# Patient Record
Sex: Female | Born: 1935 | Race: White | Hispanic: No | Smoking: Never smoker
Health system: Southern US, Community
[De-identification: ages and names within clinical notes are randomized; demographics above are authoritative.]

## PROBLEM LIST (undated history)

## (undated) DIAGNOSIS — K5900 Constipation, unspecified: Secondary | ICD-10-CM

## (undated) DIAGNOSIS — R030 Elevated blood-pressure reading, without diagnosis of hypertension: Secondary | ICD-10-CM

## (undated) DIAGNOSIS — I6789 Other cerebrovascular disease: Secondary | ICD-10-CM

## (undated) DIAGNOSIS — R209 Unspecified disturbances of skin sensation: Secondary | ICD-10-CM

## (undated) DIAGNOSIS — K269 Duodenal ulcer, unspecified as acute or chronic, without hemorrhage or perforation: Secondary | ICD-10-CM

## (undated) DIAGNOSIS — G119 Hereditary ataxia, unspecified: Secondary | ICD-10-CM

## (undated) DIAGNOSIS — R5381 Other malaise: Secondary | ICD-10-CM

## (undated) DIAGNOSIS — N182 Chronic kidney disease, stage 2 (mild): Secondary | ICD-10-CM

## (undated) DIAGNOSIS — R269 Unspecified abnormalities of gait and mobility: Secondary | ICD-10-CM

## (undated) DIAGNOSIS — M545 Low back pain, unspecified: Secondary | ICD-10-CM

## (undated) DIAGNOSIS — K649 Unspecified hemorrhoids: Secondary | ICD-10-CM

## (undated) DIAGNOSIS — N816 Rectocele: Secondary | ICD-10-CM

## (undated) DIAGNOSIS — R609 Edema, unspecified: Secondary | ICD-10-CM

## (undated) DIAGNOSIS — G2581 Restless legs syndrome: Secondary | ICD-10-CM

## (undated) DIAGNOSIS — B354 Tinea corporis: Secondary | ICD-10-CM

## (undated) DIAGNOSIS — I951 Orthostatic hypotension: Secondary | ICD-10-CM

## (undated) DIAGNOSIS — H3530 Unspecified macular degeneration: Secondary | ICD-10-CM

## (undated) DIAGNOSIS — N952 Postmenopausal atrophic vaginitis: Secondary | ICD-10-CM

## (undated) DIAGNOSIS — I4460 Unspecified fascicular block: Secondary | ICD-10-CM

## (undated) DIAGNOSIS — R42 Dizziness and giddiness: Secondary | ICD-10-CM

## (undated) DIAGNOSIS — IMO0001 Reserved for inherently not codable concepts without codable children: Secondary | ICD-10-CM

## (undated) DIAGNOSIS — G63 Polyneuropathy in diseases classified elsewhere: Principal | ICD-10-CM

## (undated) DIAGNOSIS — F419 Anxiety disorder, unspecified: Secondary | ICD-10-CM

## (undated) DIAGNOSIS — I10 Essential (primary) hypertension: Secondary | ICD-10-CM

## (undated) DIAGNOSIS — Z9181 History of falling: Secondary | ICD-10-CM

## (undated) DIAGNOSIS — R5383 Other fatigue: Secondary | ICD-10-CM

## (undated) DIAGNOSIS — M1990 Unspecified osteoarthritis, unspecified site: Secondary | ICD-10-CM

## (undated) DIAGNOSIS — N8111 Cystocele, midline: Secondary | ICD-10-CM

## (undated) DIAGNOSIS — K219 Gastro-esophageal reflux disease without esophagitis: Secondary | ICD-10-CM

## (undated) DIAGNOSIS — R7989 Other specified abnormal findings of blood chemistry: Secondary | ICD-10-CM

## (undated) DIAGNOSIS — G609 Hereditary and idiopathic neuropathy, unspecified: Secondary | ICD-10-CM

## (undated) DIAGNOSIS — R3915 Urgency of urination: Secondary | ICD-10-CM

## (undated) HISTORY — DX: Low back pain, unspecified: M54.50

## (undated) HISTORY — DX: Tinea corporis: B35.4

## (undated) HISTORY — DX: Dizziness and giddiness: R42

## (undated) HISTORY — DX: Unspecified hemorrhoids: K64.9

## (undated) HISTORY — DX: Restless legs syndrome: G25.81

## (undated) HISTORY — DX: Urgency of urination: R39.15

## (undated) HISTORY — DX: Chronic kidney disease, stage 2 (mild): N18.2

## (undated) HISTORY — DX: Cystocele, midline: N81.11

## (undated) HISTORY — DX: Unspecified abnormalities of gait and mobility: R26.9

## (undated) HISTORY — DX: Edema, unspecified: R60.9

## (undated) HISTORY — DX: Unspecified disturbances of skin sensation: R20.9

## (undated) HISTORY — DX: Unspecified fascicular block: I44.60

## (undated) HISTORY — DX: History of falling: Z91.81

## (undated) HISTORY — DX: Duodenal ulcer, unspecified as acute or chronic, without hemorrhage or perforation: K26.9

## (undated) HISTORY — DX: Reserved for inherently not codable concepts without codable children: IMO0001

## (undated) HISTORY — DX: Hereditary and idiopathic neuropathy, unspecified: G60.9

## (undated) HISTORY — DX: Rectocele: N81.6

## (undated) HISTORY — DX: Polyneuropathy in diseases classified elsewhere: G63

## (undated) HISTORY — DX: Unspecified osteoarthritis, unspecified site: M19.90

## (undated) HISTORY — DX: Constipation, unspecified: K59.00

## (undated) HISTORY — DX: Postmenopausal atrophic vaginitis: N95.2

## (undated) HISTORY — DX: Gastro-esophageal reflux disease without esophagitis: K21.9

## (undated) HISTORY — DX: Elevated blood-pressure reading, without diagnosis of hypertension: R03.0

## (undated) HISTORY — DX: Other fatigue: R53.83

## (undated) HISTORY — PX: CATARACT EXTRACTION: SUR2

## (undated) HISTORY — DX: Anxiety disorder, unspecified: F41.9

## (undated) HISTORY — DX: Other cerebrovascular disease: I67.89

## (undated) HISTORY — DX: Low back pain: M54.5

## (undated) HISTORY — DX: Hereditary ataxia, unspecified: G11.9

## (undated) HISTORY — DX: Orthostatic hypotension: I95.1

## (undated) HISTORY — DX: Other specified abnormal findings of blood chemistry: R79.89

## (undated) HISTORY — DX: Other malaise: R53.81

## (undated) HISTORY — DX: Essential (primary) hypertension: I10

## (undated) HISTORY — DX: Unspecified macular degeneration: H35.30

---
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
## 1970-10-08 HISTORY — PX: ABDOMINAL HYSTERECTOMY: SHX81

## 1998-03-22 ENCOUNTER — Ambulatory Visit (HOSPITAL_COMMUNITY): Admission: RE | Admit: 1998-03-22 | Discharge: 1998-03-22 | Payer: Self-pay | Admitting: Obstetrics & Gynecology

## 2008-10-08 HISTORY — PX: EXTERNAL EAR SURGERY: SHX627

## 2010-04-17 ENCOUNTER — Encounter: Admission: RE | Admit: 2010-04-17 | Discharge: 2010-04-17 | Payer: Self-pay | Admitting: Internal Medicine

## 2010-07-19 ENCOUNTER — Inpatient Hospital Stay (HOSPITAL_COMMUNITY): Admission: EM | Admit: 2010-07-19 | Discharge: 2010-07-25 | Payer: Self-pay | Admitting: Emergency Medicine

## 2010-12-20 LAB — BASIC METABOLIC PANEL
BUN: 14 mg/dL (ref 6–23)
BUN: 15 mg/dL (ref 6–23)
BUN: 16 mg/dL (ref 6–23)
CO2: 21 mEq/L (ref 19–32)
CO2: 23 mEq/L (ref 19–32)
CO2: 24 mEq/L (ref 19–32)
Calcium: 8 mg/dL — ABNORMAL LOW (ref 8.4–10.5)
Calcium: 8.7 mg/dL (ref 8.4–10.5)
Calcium: 9 mg/dL (ref 8.4–10.5)
Chloride: 105 mEq/L (ref 96–112)
Chloride: 105 mEq/L (ref 96–112)
Creatinine, Ser: 1.01 mg/dL (ref 0.4–1.2)
Creatinine, Ser: 1.11 mg/dL (ref 0.4–1.2)
Creatinine, Ser: 1.19 mg/dL (ref 0.4–1.2)
GFR calc Af Amer: 53 mL/min — ABNORMAL LOW (ref >60)
GFR calc Af Amer: 57 mL/min — ABNORMAL LOW (ref >60)
GFR calc Af Amer: >60 mL/min (ref >60)
GFR calc non Af Amer: 44 mL/min — ABNORMAL LOW (ref >60)
GFR calc non Af Amer: 47 mL/min — ABNORMAL LOW (ref >60)
Glucose, Bld: 105 mg/dL — ABNORMAL HIGH (ref 70–99)
Glucose, Bld: 107 mg/dL — ABNORMAL HIGH (ref 70–99)
Glucose, Bld: 155 mg/dL — ABNORMAL HIGH (ref 70–99)
Potassium: 3.7 mEq/L (ref 3.5–5.1)
Sodium: 135 mEq/L (ref 135–145)

## 2010-12-20 LAB — CBC
Hemoglobin: 12.2 g/dL (ref 12.0–15.0)
MCH: 30.9 pg (ref 26.0–34.0)
MCHC: 35.5 g/dL (ref 30.0–36.0)
MCV: 87.1 fL (ref 78.0–100.0)
Platelets: 174 10*3/uL (ref 150–400)

## 2010-12-21 LAB — CATECHOLAMINES, FRACTIONATED, URINE, 24 HOUR
Creatinine, Urine mg/day-CATEUR: 0.95 g/(24.h) (ref 0.63–2.50)
Dopamine 24 Hr Urine: 79 mcg/24 h (ref 52–480)
Epinephrine 24 Hr Urine: 6 mcg/24 h (ref 2–24)

## 2010-12-21 LAB — BASIC METABOLIC PANEL
BUN: 12 mg/dL (ref 6–23)
BUN: 13 mg/dL (ref 6–23)
CO2: 26 mEq/L (ref 19–32)
Calcium: 8.8 mg/dL (ref 8.4–10.5)
Calcium: 8.8 mg/dL (ref 8.4–10.5)
Calcium: 8.8 mg/dL (ref 8.4–10.5)
Creatinine, Ser: 0.99 mg/dL (ref 0.4–1.2)
GFR calc Af Amer: >60 mL/min (ref >60)
Glucose, Bld: 92 mg/dL (ref 70–99)
Glucose, Bld: 99 mg/dL (ref 70–99)
Potassium: 3.7 mEq/L (ref 3.5–5.1)

## 2010-12-21 LAB — POCT I-STAT, CHEM 8
BUN: 15 mg/dL (ref 6–23)
Chloride: 90 mEq/L — ABNORMAL LOW (ref 96–112)
Creatinine, Ser: 1.1 mg/dL (ref 0.4–1.2)
HCT: 43 % (ref 36.0–46.0)
Hemoglobin: 14.6 g/dL (ref 12.0–15.0)
TCO2: 27 mmol/L (ref 0–100)

## 2010-12-21 LAB — URINALYSIS, ROUTINE W REFLEX MICROSCOPIC
Glucose, UA: NEGATIVE mg/dL
Ketones, ur: 15 mg/dL — AB
Nitrite: NEGATIVE
Protein, ur: NEGATIVE mg/dL

## 2010-12-21 LAB — CBC
HCT: 35.7 % — ABNORMAL LOW (ref 36.0–46.0)
HCT: 39.6 % (ref 36.0–46.0)
MCH: 30.1 pg (ref 26.0–34.0)
MCH: 30.5 pg (ref 26.0–34.0)
MCHC: 35.9 g/dL (ref 30.0–36.0)
MCHC: 36.9 g/dL — ABNORMAL HIGH (ref 30.0–36.0)
MCV: 85.2 fL (ref 78.0–100.0)
Platelets: 210 10*3/uL (ref 150–400)
RBC: 4.19 MIL/uL (ref 3.87–5.11)
RBC: 4.85 MIL/uL (ref 3.87–5.11)
RDW: 11.7 % (ref 11.5–15.5)
RDW: 12 % (ref 11.5–15.5)
WBC: 7.2 10*3/uL (ref 4.0–10.5)
WBC: 8 10*3/uL (ref 4.0–10.5)

## 2010-12-21 LAB — CREATININE, URINE, 24 HOUR
Collection Interval-UCRE24: 24 hours
Creatinine, 24H Ur: 938 mg/d (ref 700–1800)
Creatinine, Urine: 34.1 mg/dL
Urine Total Volume-UCRE24: 2750 mL

## 2010-12-21 LAB — METANEPHRINES, URINE, 24 HOUR
Metanephrines, Ur: 50 mcg/24 h — ABNORMAL LOW (ref 90–315)
Volume, Urine-METAN: 2750 mL

## 2010-12-21 LAB — URINE MICROSCOPIC-ADD ON

## 2010-12-21 LAB — ALDOSTERONE + RENIN ACTIVITY W/ RATIO
ALDO / PRA Ratio: 7.1 Ratio (ref 0.9–28.9)
Aldosterone: <1 ng/dL

## 2010-12-21 LAB — 5 HIAA, QUANTITATIVE, URINE, 24 HOUR: Volume, Urine-5HIAA: 2750 mL/24 h

## 2011-10-25 DIAGNOSIS — G609 Hereditary and idiopathic neuropathy, unspecified: Secondary | ICD-10-CM | POA: Diagnosis not present

## 2011-10-25 DIAGNOSIS — I10 Essential (primary) hypertension: Secondary | ICD-10-CM | POA: Diagnosis not present

## 2011-10-25 DIAGNOSIS — I951 Orthostatic hypotension: Secondary | ICD-10-CM | POA: Diagnosis not present

## 2011-11-09 DIAGNOSIS — R269 Unspecified abnormalities of gait and mobility: Secondary | ICD-10-CM | POA: Diagnosis not present

## 2011-11-09 DIAGNOSIS — Z9181 History of falling: Secondary | ICD-10-CM | POA: Diagnosis not present

## 2011-11-27 DIAGNOSIS — Z9181 History of falling: Secondary | ICD-10-CM | POA: Diagnosis not present

## 2011-11-27 DIAGNOSIS — R269 Unspecified abnormalities of gait and mobility: Secondary | ICD-10-CM | POA: Diagnosis not present

## 2011-12-11 DIAGNOSIS — N182 Chronic kidney disease, stage 2 (mild): Secondary | ICD-10-CM | POA: Diagnosis not present

## 2011-12-11 DIAGNOSIS — M818 Other osteoporosis without current pathological fracture: Secondary | ICD-10-CM | POA: Diagnosis not present

## 2011-12-19 DIAGNOSIS — R5381 Other malaise: Secondary | ICD-10-CM | POA: Diagnosis not present

## 2011-12-19 DIAGNOSIS — N2581 Secondary hyperparathyroidism of renal origin: Secondary | ICD-10-CM | POA: Diagnosis not present

## 2011-12-19 DIAGNOSIS — I129 Hypertensive chronic kidney disease with stage 1 through stage 4 chronic kidney disease, or unspecified chronic kidney disease: Secondary | ICD-10-CM | POA: Diagnosis not present

## 2011-12-19 DIAGNOSIS — N182 Chronic kidney disease, stage 2 (mild): Secondary | ICD-10-CM | POA: Diagnosis not present

## 2011-12-27 DIAGNOSIS — H40029 Open angle with borderline findings, high risk, unspecified eye: Secondary | ICD-10-CM | POA: Diagnosis not present

## 2011-12-27 DIAGNOSIS — H3581 Retinal edema: Secondary | ICD-10-CM | POA: Diagnosis not present

## 2011-12-27 DIAGNOSIS — Z961 Presence of intraocular lens: Secondary | ICD-10-CM | POA: Diagnosis not present

## 2011-12-27 DIAGNOSIS — H3530 Unspecified macular degeneration: Secondary | ICD-10-CM | POA: Diagnosis not present

## 2012-01-01 DIAGNOSIS — H353190 Nonexudative age-related macular degeneration, unspecified eye, stage unspecified: Secondary | ICD-10-CM | POA: Diagnosis not present

## 2012-01-01 DIAGNOSIS — H353290 Exudative age-related macular degeneration, unspecified eye, stage unspecified: Secondary | ICD-10-CM | POA: Diagnosis not present

## 2012-01-09 DIAGNOSIS — H353290 Exudative age-related macular degeneration, unspecified eye, stage unspecified: Secondary | ICD-10-CM | POA: Diagnosis not present

## 2012-01-09 DIAGNOSIS — H35059 Retinal neovascularization, unspecified, unspecified eye: Secondary | ICD-10-CM | POA: Diagnosis not present

## 2012-02-19 DIAGNOSIS — H35059 Retinal neovascularization, unspecified, unspecified eye: Secondary | ICD-10-CM | POA: Diagnosis not present

## 2012-02-19 DIAGNOSIS — H353290 Exudative age-related macular degeneration, unspecified eye, stage unspecified: Secondary | ICD-10-CM | POA: Diagnosis not present

## 2012-03-13 DIAGNOSIS — I10 Essential (primary) hypertension: Secondary | ICD-10-CM | POA: Diagnosis not present

## 2012-03-13 DIAGNOSIS — F411 Generalized anxiety disorder: Secondary | ICD-10-CM | POA: Diagnosis not present

## 2012-03-13 DIAGNOSIS — M25539 Pain in unspecified wrist: Secondary | ICD-10-CM | POA: Diagnosis not present

## 2012-03-13 DIAGNOSIS — G2581 Restless legs syndrome: Secondary | ICD-10-CM | POA: Diagnosis not present

## 2012-04-01 DIAGNOSIS — H353290 Exudative age-related macular degeneration, unspecified eye, stage unspecified: Secondary | ICD-10-CM | POA: Diagnosis not present

## 2012-04-01 DIAGNOSIS — H35059 Retinal neovascularization, unspecified, unspecified eye: Secondary | ICD-10-CM | POA: Diagnosis not present

## 2012-04-01 DIAGNOSIS — H35359 Cystoid macular degeneration, unspecified eye: Secondary | ICD-10-CM | POA: Diagnosis not present

## 2012-05-07 DIAGNOSIS — H35059 Retinal neovascularization, unspecified, unspecified eye: Secondary | ICD-10-CM | POA: Diagnosis not present

## 2012-05-07 DIAGNOSIS — H353290 Exudative age-related macular degeneration, unspecified eye, stage unspecified: Secondary | ICD-10-CM | POA: Diagnosis not present

## 2012-06-03 DIAGNOSIS — R5381 Other malaise: Secondary | ICD-10-CM | POA: Diagnosis not present

## 2012-06-03 DIAGNOSIS — N2581 Secondary hyperparathyroidism of renal origin: Secondary | ICD-10-CM | POA: Diagnosis not present

## 2012-06-03 DIAGNOSIS — N182 Chronic kidney disease, stage 2 (mild): Secondary | ICD-10-CM | POA: Diagnosis not present

## 2012-06-03 DIAGNOSIS — R5383 Other fatigue: Secondary | ICD-10-CM | POA: Diagnosis not present

## 2012-06-11 DIAGNOSIS — N182 Chronic kidney disease, stage 2 (mild): Secondary | ICD-10-CM | POA: Diagnosis not present

## 2012-06-11 DIAGNOSIS — R5383 Other fatigue: Secondary | ICD-10-CM | POA: Diagnosis not present

## 2012-06-11 DIAGNOSIS — R5381 Other malaise: Secondary | ICD-10-CM | POA: Diagnosis not present

## 2012-06-11 DIAGNOSIS — I129 Hypertensive chronic kidney disease with stage 1 through stage 4 chronic kidney disease, or unspecified chronic kidney disease: Secondary | ICD-10-CM | POA: Diagnosis not present

## 2012-06-11 DIAGNOSIS — N2581 Secondary hyperparathyroidism of renal origin: Secondary | ICD-10-CM | POA: Diagnosis not present

## 2012-06-12 DIAGNOSIS — H35059 Retinal neovascularization, unspecified, unspecified eye: Secondary | ICD-10-CM | POA: Diagnosis not present

## 2012-06-12 DIAGNOSIS — H353290 Exudative age-related macular degeneration, unspecified eye, stage unspecified: Secondary | ICD-10-CM | POA: Diagnosis not present

## 2012-07-15 DIAGNOSIS — E212 Other hyperparathyroidism: Secondary | ICD-10-CM | POA: Diagnosis not present

## 2012-07-15 DIAGNOSIS — E039 Hypothyroidism, unspecified: Secondary | ICD-10-CM | POA: Diagnosis not present

## 2012-07-15 DIAGNOSIS — R197 Diarrhea, unspecified: Secondary | ICD-10-CM | POA: Diagnosis not present

## 2012-07-15 DIAGNOSIS — E559 Vitamin D deficiency, unspecified: Secondary | ICD-10-CM | POA: Diagnosis not present

## 2012-07-15 LAB — CBC AND DIFFERENTIAL
HCT: 36 % (ref 36–46)
Hemoglobin: 13 g/dL (ref 12.0–16.0)
WBC: 5.5 10^3/mL

## 2012-07-15 LAB — BASIC METABOLIC PANEL
Potassium: 4.1 mmol/L (ref 3.4–5.3)
Sodium: 141 mmol/L (ref 137–147)

## 2012-07-15 LAB — TSH: TSH: 2.17 u[IU]/mL (ref 0.41–5.90)

## 2012-07-16 DIAGNOSIS — H353290 Exudative age-related macular degeneration, unspecified eye, stage unspecified: Secondary | ICD-10-CM | POA: Diagnosis not present

## 2012-07-16 DIAGNOSIS — H3570 Unspecified separation of retinal layers: Secondary | ICD-10-CM | POA: Diagnosis not present

## 2012-07-16 DIAGNOSIS — H35059 Retinal neovascularization, unspecified, unspecified eye: Secondary | ICD-10-CM | POA: Diagnosis not present

## 2012-07-16 DIAGNOSIS — H35359 Cystoid macular degeneration, unspecified eye: Secondary | ICD-10-CM | POA: Diagnosis not present

## 2012-07-17 DIAGNOSIS — I4460 Unspecified fascicular block: Secondary | ICD-10-CM | POA: Diagnosis not present

## 2012-07-17 DIAGNOSIS — N816 Rectocele: Secondary | ICD-10-CM | POA: Diagnosis not present

## 2012-07-17 DIAGNOSIS — N8111 Cystocele, midline: Secondary | ICD-10-CM | POA: Diagnosis not present

## 2012-07-17 DIAGNOSIS — F411 Generalized anxiety disorder: Secondary | ICD-10-CM | POA: Diagnosis not present

## 2012-07-17 DIAGNOSIS — H6120 Impacted cerumen, unspecified ear: Secondary | ICD-10-CM | POA: Diagnosis not present

## 2012-07-17 DIAGNOSIS — R209 Unspecified disturbances of skin sensation: Secondary | ICD-10-CM | POA: Diagnosis not present

## 2012-08-14 DIAGNOSIS — F411 Generalized anxiety disorder: Secondary | ICD-10-CM | POA: Diagnosis not present

## 2012-08-14 DIAGNOSIS — I10 Essential (primary) hypertension: Secondary | ICD-10-CM | POA: Diagnosis not present

## 2012-08-14 DIAGNOSIS — K5900 Constipation, unspecified: Secondary | ICD-10-CM | POA: Diagnosis not present

## 2012-08-16 DIAGNOSIS — Z23 Encounter for immunization: Secondary | ICD-10-CM | POA: Diagnosis not present

## 2012-08-20 DIAGNOSIS — H353290 Exudative age-related macular degeneration, unspecified eye, stage unspecified: Secondary | ICD-10-CM | POA: Diagnosis not present

## 2012-08-20 DIAGNOSIS — H35059 Retinal neovascularization, unspecified, unspecified eye: Secondary | ICD-10-CM | POA: Diagnosis not present

## 2012-09-24 DIAGNOSIS — H35059 Retinal neovascularization, unspecified, unspecified eye: Secondary | ICD-10-CM | POA: Diagnosis not present

## 2012-09-24 DIAGNOSIS — H353290 Exudative age-related macular degeneration, unspecified eye, stage unspecified: Secondary | ICD-10-CM | POA: Diagnosis not present

## 2012-10-23 DIAGNOSIS — I10 Essential (primary) hypertension: Secondary | ICD-10-CM | POA: Diagnosis not present

## 2012-10-23 DIAGNOSIS — M62838 Other muscle spasm: Secondary | ICD-10-CM | POA: Diagnosis not present

## 2012-10-23 DIAGNOSIS — G4700 Insomnia, unspecified: Secondary | ICD-10-CM | POA: Diagnosis not present

## 2012-10-23 DIAGNOSIS — F411 Generalized anxiety disorder: Secondary | ICD-10-CM | POA: Diagnosis not present

## 2012-10-23 DIAGNOSIS — M461 Sacroiliitis, not elsewhere classified: Secondary | ICD-10-CM | POA: Diagnosis not present

## 2012-10-24 DIAGNOSIS — R269 Unspecified abnormalities of gait and mobility: Secondary | ICD-10-CM | POA: Diagnosis not present

## 2012-10-24 DIAGNOSIS — Z9181 History of falling: Secondary | ICD-10-CM | POA: Diagnosis not present

## 2012-10-27 DIAGNOSIS — Z9181 History of falling: Secondary | ICD-10-CM | POA: Diagnosis not present

## 2012-10-27 DIAGNOSIS — R269 Unspecified abnormalities of gait and mobility: Secondary | ICD-10-CM | POA: Diagnosis not present

## 2012-10-28 DIAGNOSIS — Z9181 History of falling: Secondary | ICD-10-CM | POA: Diagnosis not present

## 2012-10-28 DIAGNOSIS — R269 Unspecified abnormalities of gait and mobility: Secondary | ICD-10-CM | POA: Diagnosis not present

## 2012-10-29 DIAGNOSIS — H35359 Cystoid macular degeneration, unspecified eye: Secondary | ICD-10-CM | POA: Diagnosis not present

## 2012-10-29 DIAGNOSIS — H35059 Retinal neovascularization, unspecified, unspecified eye: Secondary | ICD-10-CM | POA: Diagnosis not present

## 2012-10-29 DIAGNOSIS — H353290 Exudative age-related macular degeneration, unspecified eye, stage unspecified: Secondary | ICD-10-CM | POA: Diagnosis not present

## 2012-10-30 DIAGNOSIS — G4700 Insomnia, unspecified: Secondary | ICD-10-CM | POA: Diagnosis not present

## 2012-10-30 DIAGNOSIS — G609 Hereditary and idiopathic neuropathy, unspecified: Secondary | ICD-10-CM | POA: Diagnosis not present

## 2012-10-30 DIAGNOSIS — I10 Essential (primary) hypertension: Secondary | ICD-10-CM | POA: Diagnosis not present

## 2012-10-31 DIAGNOSIS — Z9181 History of falling: Secondary | ICD-10-CM | POA: Diagnosis not present

## 2012-10-31 DIAGNOSIS — R269 Unspecified abnormalities of gait and mobility: Secondary | ICD-10-CM | POA: Diagnosis not present

## 2012-11-03 DIAGNOSIS — Z9181 History of falling: Secondary | ICD-10-CM | POA: Diagnosis not present

## 2012-11-03 DIAGNOSIS — R269 Unspecified abnormalities of gait and mobility: Secondary | ICD-10-CM | POA: Diagnosis not present

## 2012-11-07 DIAGNOSIS — Z9181 History of falling: Secondary | ICD-10-CM | POA: Diagnosis not present

## 2012-11-07 DIAGNOSIS — R269 Unspecified abnormalities of gait and mobility: Secondary | ICD-10-CM | POA: Diagnosis not present

## 2012-11-10 DIAGNOSIS — R262 Difficulty in walking, not elsewhere classified: Secondary | ICD-10-CM | POA: Diagnosis not present

## 2012-11-10 DIAGNOSIS — R269 Unspecified abnormalities of gait and mobility: Secondary | ICD-10-CM | POA: Diagnosis not present

## 2012-11-10 DIAGNOSIS — Z9181 History of falling: Secondary | ICD-10-CM | POA: Diagnosis not present

## 2012-11-14 DIAGNOSIS — R262 Difficulty in walking, not elsewhere classified: Secondary | ICD-10-CM | POA: Diagnosis not present

## 2012-11-14 DIAGNOSIS — R269 Unspecified abnormalities of gait and mobility: Secondary | ICD-10-CM | POA: Diagnosis not present

## 2012-11-14 DIAGNOSIS — Z9181 History of falling: Secondary | ICD-10-CM | POA: Diagnosis not present

## 2012-11-17 DIAGNOSIS — R262 Difficulty in walking, not elsewhere classified: Secondary | ICD-10-CM | POA: Diagnosis not present

## 2012-11-17 DIAGNOSIS — R269 Unspecified abnormalities of gait and mobility: Secondary | ICD-10-CM | POA: Diagnosis not present

## 2012-11-17 DIAGNOSIS — Z9181 History of falling: Secondary | ICD-10-CM | POA: Diagnosis not present

## 2012-11-19 DIAGNOSIS — Z9181 History of falling: Secondary | ICD-10-CM | POA: Diagnosis not present

## 2012-11-19 DIAGNOSIS — R269 Unspecified abnormalities of gait and mobility: Secondary | ICD-10-CM | POA: Diagnosis not present

## 2012-11-19 DIAGNOSIS — R262 Difficulty in walking, not elsewhere classified: Secondary | ICD-10-CM | POA: Diagnosis not present

## 2012-11-21 DIAGNOSIS — R262 Difficulty in walking, not elsewhere classified: Secondary | ICD-10-CM | POA: Diagnosis not present

## 2012-11-21 DIAGNOSIS — Z9181 History of falling: Secondary | ICD-10-CM | POA: Diagnosis not present

## 2012-11-21 DIAGNOSIS — R269 Unspecified abnormalities of gait and mobility: Secondary | ICD-10-CM | POA: Diagnosis not present

## 2012-11-24 DIAGNOSIS — R269 Unspecified abnormalities of gait and mobility: Secondary | ICD-10-CM | POA: Diagnosis not present

## 2012-11-24 DIAGNOSIS — R262 Difficulty in walking, not elsewhere classified: Secondary | ICD-10-CM | POA: Diagnosis not present

## 2012-11-24 DIAGNOSIS — Z9181 History of falling: Secondary | ICD-10-CM | POA: Diagnosis not present

## 2012-11-26 DIAGNOSIS — R269 Unspecified abnormalities of gait and mobility: Secondary | ICD-10-CM | POA: Diagnosis not present

## 2012-11-26 DIAGNOSIS — Z9181 History of falling: Secondary | ICD-10-CM | POA: Diagnosis not present

## 2012-11-26 DIAGNOSIS — R262 Difficulty in walking, not elsewhere classified: Secondary | ICD-10-CM | POA: Diagnosis not present

## 2012-11-28 DIAGNOSIS — R262 Difficulty in walking, not elsewhere classified: Secondary | ICD-10-CM | POA: Diagnosis not present

## 2012-11-28 DIAGNOSIS — R269 Unspecified abnormalities of gait and mobility: Secondary | ICD-10-CM | POA: Diagnosis not present

## 2012-11-28 DIAGNOSIS — Z9181 History of falling: Secondary | ICD-10-CM | POA: Diagnosis not present

## 2012-12-01 DIAGNOSIS — R262 Difficulty in walking, not elsewhere classified: Secondary | ICD-10-CM | POA: Diagnosis not present

## 2012-12-01 DIAGNOSIS — Z9181 History of falling: Secondary | ICD-10-CM | POA: Diagnosis not present

## 2012-12-01 DIAGNOSIS — R269 Unspecified abnormalities of gait and mobility: Secondary | ICD-10-CM | POA: Diagnosis not present

## 2012-12-03 DIAGNOSIS — H3570 Unspecified separation of retinal layers: Secondary | ICD-10-CM | POA: Diagnosis not present

## 2012-12-03 DIAGNOSIS — H35359 Cystoid macular degeneration, unspecified eye: Secondary | ICD-10-CM | POA: Diagnosis not present

## 2012-12-03 DIAGNOSIS — H353290 Exudative age-related macular degeneration, unspecified eye, stage unspecified: Secondary | ICD-10-CM | POA: Diagnosis not present

## 2012-12-03 DIAGNOSIS — H35059 Retinal neovascularization, unspecified, unspecified eye: Secondary | ICD-10-CM | POA: Diagnosis not present

## 2012-12-05 DIAGNOSIS — R269 Unspecified abnormalities of gait and mobility: Secondary | ICD-10-CM | POA: Diagnosis not present

## 2012-12-05 DIAGNOSIS — Z9181 History of falling: Secondary | ICD-10-CM | POA: Diagnosis not present

## 2012-12-05 DIAGNOSIS — R262 Difficulty in walking, not elsewhere classified: Secondary | ICD-10-CM | POA: Diagnosis not present

## 2013-01-01 DIAGNOSIS — H3530 Unspecified macular degeneration: Secondary | ICD-10-CM | POA: Diagnosis not present

## 2013-01-01 DIAGNOSIS — H40029 Open angle with borderline findings, high risk, unspecified eye: Secondary | ICD-10-CM | POA: Diagnosis not present

## 2013-01-01 DIAGNOSIS — Z961 Presence of intraocular lens: Secondary | ICD-10-CM | POA: Diagnosis not present

## 2013-01-07 DIAGNOSIS — H353290 Exudative age-related macular degeneration, unspecified eye, stage unspecified: Secondary | ICD-10-CM | POA: Diagnosis not present

## 2013-01-07 DIAGNOSIS — H35059 Retinal neovascularization, unspecified, unspecified eye: Secondary | ICD-10-CM | POA: Diagnosis not present

## 2013-01-07 DIAGNOSIS — H35359 Cystoid macular degeneration, unspecified eye: Secondary | ICD-10-CM | POA: Diagnosis not present

## 2013-01-15 ENCOUNTER — Encounter: Payer: Self-pay | Admitting: Nurse Practitioner

## 2013-01-15 ENCOUNTER — Non-Acute Institutional Stay (INDEPENDENT_AMBULATORY_CARE_PROVIDER_SITE_OTHER): Payer: Medicare Other | Admitting: Nurse Practitioner

## 2013-01-15 VITALS — BP 124/76 | HR 76 | Wt 145.0 lb

## 2013-01-15 DIAGNOSIS — I10 Essential (primary) hypertension: Secondary | ICD-10-CM

## 2013-01-15 DIAGNOSIS — N8110 Cystocele, unspecified: Secondary | ICD-10-CM | POA: Insufficient documentation

## 2013-01-15 DIAGNOSIS — M1611 Unilateral primary osteoarthritis, right hip: Secondary | ICD-10-CM

## 2013-01-15 DIAGNOSIS — M169 Osteoarthritis of hip, unspecified: Secondary | ICD-10-CM

## 2013-01-15 DIAGNOSIS — M533 Sacrococcygeal disorders, not elsewhere classified: Secondary | ICD-10-CM | POA: Diagnosis not present

## 2013-01-15 DIAGNOSIS — M1711 Unilateral primary osteoarthritis, right knee: Secondary | ICD-10-CM

## 2013-01-15 DIAGNOSIS — M1710 Unilateral primary osteoarthritis, unspecified knee: Secondary | ICD-10-CM | POA: Diagnosis not present

## 2013-01-15 DIAGNOSIS — N8111 Cystocele, midline: Secondary | ICD-10-CM

## 2013-01-15 DIAGNOSIS — IMO0002 Reserved for concepts with insufficient information to code with codable children: Secondary | ICD-10-CM

## 2013-01-15 DIAGNOSIS — G4700 Insomnia, unspecified: Secondary | ICD-10-CM

## 2013-01-15 DIAGNOSIS — F411 Generalized anxiety disorder: Secondary | ICD-10-CM | POA: Insufficient documentation

## 2013-01-15 NOTE — Assessment & Plan Note (Addendum)
Stage III--pessary no longer stay in place--GYN consult.

## 2013-01-15 NOTE — Assessment & Plan Note (Addendum)
Pain when lying on it at night--started after PT working with her, X-ray to evaluate-hold PT

## 2013-01-15 NOTE — Assessment & Plan Note (Signed)
Much better with Temazepam

## 2013-01-15 NOTE — Assessment & Plan Note (Addendum)
SIJ region pain--reproduced by palpation-X-ray pelvis and L spine and hold PT. Voltaren didn't help much

## 2013-01-15 NOTE — Assessment & Plan Note (Signed)
New since start working with PT--obtain X ray to evaluate further, hold PT

## 2013-01-15 NOTE — Assessment & Plan Note (Signed)
Controlled on Nifedipine and Labetalol.

## 2013-01-15 NOTE — Assessment & Plan Note (Signed)
Am Ativan helps.

## 2013-01-15 NOTE — Progress Notes (Signed)
Patient ID: Jacqueline Dodson, female   DOB: 1926-12-18, 79 y.o.   MRN: [id number]  Chief Complaint:  Chief Complaint  Patient presents with  . Medical Managment of Chronic Issues    blood pressure, anxiey, CKD, paresthesia     HPI:     PT is doing muscle stretch exercise, presently on hold--muscle spasm is much better, but she experienced right hip pain when lying on her right side and right knee pain with weight bearing which are new to her. The pain in her right SIJ region persisted and Voltaren gel didn't help much-she used it up any way since its already paid. . Pain in the R SIJ comes and goes, sharp or aches, regardless time of day.  RESTLESS LEGS SYNDROME  cannot keep legs still when watching movie and sometimes bothers her at night. Never filled prescription for Requip--worries about its side effects. She says this problem seems to be better.  NEUROPATHY, PERIPHERAL  Confirmed on PNCV 2010. Improved. Has lost vibratory sensation on the right leg  HTN UNSPECIFIED The blood pressure readings taken outside the office since the last visit have been in the target range.takes Labetalol 100mg  bid, Nifedipine 60mg  daily  GERD  better since Prilosec started.   SACROILIITIS, NOT ELSEWHERE CLASSIFIED  The right.   PAIN LOWER BACK  right SI joint occasionally. Low back discomfort if she stands a long time sometimes.  MUSCLE SPASM  Better now.  No noted trigger factors.  INSOMNIA, UNSPECIFIED  clonazepam did not help and was associated with wild dreams--better with Temazepam.   Anxiety: Ativan in am helps.    Review of Systems:  Review of Systems  Constitutional: Negative for fever, chills, weight loss, malaise/fatigue and diaphoresis.  HENT: Negative for hearing loss, ear pain, congestion, sore throat and neck pain.   Eyes: Negative for pain, discharge and redness.  Respiratory: Negative for cough, sputum production, shortness of breath and wheezing.   Cardiovascular: Negative for chest pain,  palpitations, orthopnea, claudication, leg swelling and PND.  Gastrointestinal: Negative for nausea, vomiting, abdominal pain, diarrhea and constipation.  Genitourinary: Positive for frequency. Negative for dysuria, urgency and flank pain.       Cystocele stage III out side her body now.   Musculoskeletal: Positive for back pain (right SIJ) and joint pain (right hip and knee). Negative for myalgias and falls.  Skin: Negative for itching and rash.  Neurological: Positive for sensory change (was peripheral neuropathy in RLE--better now). Negative for dizziness, tremors, speech change, focal weakness, seizures, loss of consciousness, weakness and headaches.  Endo/Heme/Allergies: Negative for environmental allergies and polydipsia. Does not bruise/bleed easily.  Psychiatric/Behavioral: Negative for depression, hallucinations and memory loss. The patient is nervous/anxious and has insomnia.      Medications: Patient's Medications  New Prescriptions   No medications on file  Previous Medications   ACETAMINOPHEN (TYLENOL) 500 MG TABLET    Take 500 mg by mouth. Take one tablet once daily as needed   BEVACIZUMAB (AVASTIN) 100 MG/4ML SOLN    Inject into the vein. One injection in right eye once a month by Dr. Zadie Rhine   BISMUTH SUBSALICYLATE (PEPTO BISMOL) 262 MG/15ML SUSPENSION    Take 15 mLs by mouth. Take as needed for upset stomach   CONJUGATED ESTROGENS (PREMARIN) VAGINAL CREAM    Place vaginally daily. Use daily to lubricate pessary   LABETALOL (NORMODYNE) 100 MG TABLET    Take 100 mg by mouth 2 (two) times daily. To control blood pressure   LORAZEPAM (ATIVAN)  0.5 MG TABLET    Take 0.5 mg by mouth. Take one tablet once daily  as needed for anxiety   MULTIPLE VITAMINS-MINERALS (ICAPS) CAPS    Take by mouth. Take one daily   MULTIPLE VITAMINS-MINERALS (MULTIVITAMIN WITH MINERALS) TABLET    Take 1 tablet by mouth daily.   NIFEDIPINE (PROCARDIA-XL/ADALAT CC) 60 MG 24 HR TABLET    Take 60 mg by mouth.  Take one daly to control blood pressure   PSYLLIUM (METAMUCIL MULTIHEALTH FIBER) 58.6 % POWDER    Take 1 packet by mouth. One tablespoon daily in at least 8 oz fluid as needed to prevent constipation   SENNA (SENOKOT) 8.6 MG TABLET    Take 1 tablet by mouth. Take 2-4 tablets nightly for laxative   TELMISARTAN (MICARDIS) 80 MG TABLET    Take 80 mg by mouth daily. To control blood pressure   TEMAZEPAM (RESTORIL) 15 MG CAPSULE    15 mg. Take one in evening for rest   TOLNAFTATE (TINACTIN) 1 % CREAM    Apply topically 2 (two) times daily. To rash  Modified Medications   No medications on file  Discontinued Medications   No medications on file     Physical Exam: Physical Exam  Constitutional: She is oriented to person, place, and time. She appears well-developed and well-nourished. No distress.  HENT:  Head: Normocephalic and atraumatic.  Eyes: EOM are normal. Pupils are equal, round, and reactive to light. Right eye exhibits no discharge.  Neck: Normal range of motion. Neck supple. No JVD present. No thyromegaly present.  Cardiovascular: Normal rate, regular rhythm and normal heart sounds.   No murmur heard. Pulmonary/Chest: Effort normal and breath sounds normal. She has no wheezes. She has no rales.  Abdominal: Soft. Bowel sounds are normal. She exhibits no distension. There is no tenderness.  Musculoskeletal: Normal range of motion. She exhibits tenderness (right SIJ, hip, knee tenderness). She exhibits no edema.  Lymphadenopathy:    She has no cervical adenopathy.  Neurological: She is alert and oriented to person, place, and time. She has normal reflexes. She displays normal reflexes. No cranial nerve deficit. She exhibits normal muscle tone. Coordination normal.  Skin: Skin is warm and dry. No rash noted. She is not diaphoretic. No erythema.  Psychiatric: She has a normal mood and affect. Her behavior is normal. Judgment and thought content normal.     Filed Vitals:   01/15/13  1333  BP: 124/76  Pulse: 76  Weight: 145 lb (65.772 kg)      Labs reviewed: Basic Metabolic Panel:  Recent Labs  07/15/12  NA 141  K 4.1  BUN 21  CREATININE 1.2*  TSH 2.17    Liver Function Tests: No results found for this basename: AST, ALT, ALKPHOS, BILITOT, PROT, ALBUMIN,  in the last 8760 hours  CBC:  Recent Labs  07/15/12  WBC 5.5  HGB 13.0  HCT 36  PLT 213       Assessment/Plan Osteoarthritis of right knee New since start working with PT--obtain X ray to evaluate further, hold PT  Osteoarthritis of right hip Pain when lying on it at night--started after PT working with her, X-ray to evaluate-hold PT  Sacroiliac pain SIJ region pain--reproduced by palpation-X-ray pelvis and L spine and hold PT. Voltaren didn't help much  HTN (hypertension) Controlled on Nifedipine and Labetalol.   Insomnia Much better with Temazepam   Generalized anxiety disorder Am Ativan helps.   Bladder cystocele Stage III--pessary no longer stay in  place--GYN consult.       Family/ staff Communication: referral to GYN, hold PT until X ray r/o fxs   Goals of care: managing pain, maintaining presently physical function   Labs/tests ordered    X-ray L spine, pelvis, R hip, R knee

## 2013-01-22 ENCOUNTER — Other Ambulatory Visit: Payer: Self-pay | Admitting: Nurse Practitioner

## 2013-01-22 ENCOUNTER — Ambulatory Visit
Admission: RE | Admit: 2013-01-22 | Discharge: 2013-01-22 | Disposition: A | Discharge status: Home / Self Care | Payer: Medicare Other | Source: Ambulatory Visit | Attending: Nurse Practitioner | Admitting: Nurse Practitioner

## 2013-01-22 DIAGNOSIS — M25561 Pain in right knee: Secondary | ICD-10-CM

## 2013-01-22 DIAGNOSIS — M533 Sacrococcygeal disorders, not elsewhere classified: Secondary | ICD-10-CM

## 2013-01-22 DIAGNOSIS — M1710 Unilateral primary osteoarthritis, unspecified knee: Secondary | ICD-10-CM | POA: Diagnosis not present

## 2013-01-22 DIAGNOSIS — G8929 Other chronic pain: Secondary | ICD-10-CM

## 2013-01-22 DIAGNOSIS — M169 Osteoarthritis of hip, unspecified: Secondary | ICD-10-CM | POA: Diagnosis not present

## 2013-01-22 DIAGNOSIS — M25551 Pain in right hip: Secondary | ICD-10-CM

## 2013-01-22 DIAGNOSIS — M47817 Spondylosis without myelopathy or radiculopathy, lumbosacral region: Secondary | ICD-10-CM | POA: Diagnosis not present

## 2013-01-22 DIAGNOSIS — M5137 Other intervertebral disc degeneration, lumbosacral region: Secondary | ICD-10-CM | POA: Diagnosis not present

## 2013-02-02 ENCOUNTER — Other Ambulatory Visit: Payer: Self-pay | Admitting: Internal Medicine

## 2013-02-11 DIAGNOSIS — H35059 Retinal neovascularization, unspecified, unspecified eye: Secondary | ICD-10-CM | POA: Diagnosis not present

## 2013-02-11 DIAGNOSIS — H353290 Exudative age-related macular degeneration, unspecified eye, stage unspecified: Secondary | ICD-10-CM | POA: Diagnosis not present

## 2013-02-12 ENCOUNTER — Non-Acute Institutional Stay: Payer: Medicare Other | Admitting: Nurse Practitioner

## 2013-02-12 ENCOUNTER — Encounter: Payer: Self-pay | Admitting: Nurse Practitioner

## 2013-02-12 VITALS — BP 126/70 | HR 72 | Ht 65.5 in | Wt 145.0 lb

## 2013-02-12 DIAGNOSIS — M1711 Unilateral primary osteoarthritis, right knee: Secondary | ICD-10-CM

## 2013-02-12 DIAGNOSIS — F411 Generalized anxiety disorder: Secondary | ICD-10-CM

## 2013-02-12 DIAGNOSIS — M1611 Unilateral primary osteoarthritis, right hip: Secondary | ICD-10-CM

## 2013-02-12 DIAGNOSIS — M1710 Unilateral primary osteoarthritis, unspecified knee: Secondary | ICD-10-CM

## 2013-02-12 DIAGNOSIS — I10 Essential (primary) hypertension: Secondary | ICD-10-CM

## 2013-02-12 DIAGNOSIS — G4700 Insomnia, unspecified: Secondary | ICD-10-CM

## 2013-02-12 DIAGNOSIS — M169 Osteoarthritis of hip, unspecified: Secondary | ICD-10-CM

## 2013-02-12 DIAGNOSIS — IMO0002 Reserved for concepts with insufficient information to code with codable children: Secondary | ICD-10-CM

## 2013-02-12 DIAGNOSIS — N8111 Cystocele, midline: Secondary | ICD-10-CM

## 2013-02-12 DIAGNOSIS — M533 Sacrococcygeal disorders, not elsewhere classified: Secondary | ICD-10-CM

## 2013-02-12 NOTE — Assessment & Plan Note (Signed)
Temazepam with questionable efficacy, will started Cymbalta to better manage mood

## 2013-02-12 NOTE — Assessment & Plan Note (Signed)
Stage III--pessary no longer stay in place--GYN consult.

## 2013-02-12 NOTE — Assessment & Plan Note (Signed)
No change 

## 2013-02-12 NOTE — Assessment & Plan Note (Signed)
SIJ region pain-degenerative changes. Cymbalta may help

## 2013-02-12 NOTE — Assessment & Plan Note (Signed)
Pain when lying on it at night--no change, X-ray showed degenerative change

## 2013-02-12 NOTE — Progress Notes (Signed)
Patient ID: Jacqueline Dodson, female   DOB: 1927-09-02, 79 y.o.   MRN: [id number] Code Status: Living Will  Allergies  Allergen Reactions  . Amlodipine   . Amoxicillin   . Enalapril   . Hctz (Hydrochlorothiazide)   . Penicillins     Chief Complaint  Patient presents with  . Medical Managment of Chronic Issues    follow up on right knee and hip pain, had x-rays. Pain daily, comes and goes.    HPI: Patient is a 79 y.o. female seen in the clinic at [facility] today for anxiety, chronic right SIJ and knee pain.  Problem List Items Addressed This Visit     ICD-9-CM   Osteoarthritis of right knee     No change      Osteoarthritis of right hip     Pain when lying on it at night--no change, X-ray showed degenerative change      Sacroiliac pain     SIJ region pain-degenerative changes. Cymbalta may help      HTN (hypertension)     Controlled on Nifedipine and Labetalol.       Insomnia     Temazepam with questionable efficacy, will started Cymbalta to better manage mood      Bladder cystocele - Primary     Stage III--pessary no longer stay in place--GYN consult.       Generalized anxiety disorder (Chronic)     Am Ativan helps temporarily, still c/o not sleeping well at night regardless Temazepam, feels sad. Will try Cymbalta 20mg  daily in setting of chronic back/knee pain. Will change Ativan and Temazepam to prn, f/u BMP         Review of Systems:  Review of Systems  Constitutional: Negative for fever, chills, weight loss, malaise/fatigue and diaphoresis.  HENT: Negative for hearing loss, ear pain, congestion, sore throat and neck pain.   Eyes: Negative for pain, discharge and redness.  Respiratory: Negative for cough, sputum production, shortness of breath and wheezing.   Cardiovascular: Negative for chest pain, palpitations, orthopnea, claudication, leg swelling and PND.  Gastrointestinal: Negative for nausea, vomiting, abdominal pain, diarrhea and  constipation.  Genitourinary: Positive for frequency. Negative for dysuria, urgency and flank pain.       Cystocele stage III out side her body now.   Musculoskeletal: Positive for back pain (right SIJ) and joint pain (right hip and knee). Negative for myalgias and falls.  Skin: Negative for itching and rash.  Neurological: Positive for sensory change (was peripheral neuropathy in RLE--better now). Negative for dizziness, tremors, speech change, focal weakness, seizures, loss of consciousness, weakness and headaches.  Endo/Heme/Allergies: Negative for environmental allergies and polydipsia. Does not bruise/bleed easily.  Psychiatric/Behavioral: Negative for depression, hallucinations and memory loss. The patient is nervous/anxious and has insomnia.      Past Medical History  Diagnosis Date  . Dermatophytosis of the body   . Anxiety   . Restless legs syndrome (RLS)   . Primary cerebellar degeneration   . Unspecified hereditary and idiopathic peripheral neuropathy   . Macular degeneration (senile) of retina, unspecified   . Hypertension   . Left bundle branch hemiblock   . Acute, but ill-defined, cerebrovascular disease   . Hemorrhoids   . Orthostatic hypotension   . GERD (gastroesophageal reflux disease)   . Duodenal ulcer, unspecified as acute or chronic, without hemorrhage, perforation, or obstruction   . Unspecified constipation   . Chronic kidney disease, stage II (mild)   . Cystocele, midline   .  Rectocele   . Postmenopausal atrophic vaginitis   . Lumbago   . Myalgia and myositis, unspecified   . Dizziness and giddiness   . Other malaise and fatigue   . Abnormality of gait   . Disturbance of skin sensation   . Edema   . Urgency of urination   . Other abnormal blood chemistry   . Elevated blood pressure reading without diagnosis of hypertension   . Personal history of fall    Past Surgical History  Procedure Laterality Date  . Tonsillectomy  1940  . Appendectomy  1945   . Abdominal hysterectomy  1972  . External ear surgery  2010    left outer ear basel cell removed   Social History:   reports that she has never smoked. She has never used smokeless tobacco. She reports that she does not drink alcohol or use illicit drugs.  Family History  Problem Relation Age of Onset  . Aortic stenosis Mother   . Thrombosis Father   . Ataxia Sister   . Ataxia Brother   . Heart disease Brother   . Stroke Brother     Medications: Patient's Medications  New Prescriptions   No medications on file  Previous Medications   ACETAMINOPHEN (TYLENOL) 500 MG TABLET    Take 500 mg by mouth. Take one tablet once daily as needed   BEVACIZUMAB (AVASTIN) 100 MG/4ML SOLN    Inject into the vein. One injection in right eye once a month by Dr. Zadie Rhine   BISMUTH SUBSALICYLATE (PEPTO BISMOL) 262 MG/15ML SUSPENSION    Take 15 mLs by mouth. Take as needed for upset stomach   LABETALOL (NORMODYNE) 100 MG TABLET    TAKE 1 TABLET TWICE A DAY TO CONTROL BLOOD PRESSURE   MULTIPLE VITAMINS-MINERALS (ICAPS) CAPS    Take by mouth. Take one daily   MULTIPLE VITAMINS-MINERALS (MULTIVITAMIN WITH MINERALS) TABLET    Take 1 tablet by mouth daily.   NIFEDIPINE (PROCARDIA-XL/ADALAT CC) 60 MG 24 HR TABLET    Take 60 mg by mouth. Take one daly to control blood pressure   PSYLLIUM (METAMUCIL MULTIHEALTH FIBER) 58.6 % POWDER    Take 1 packet by mouth. One tablespoon daily in at least 8 oz fluid as needed to prevent constipation   SENNA (SENOKOT) 8.6 MG TABLET    Take 1 tablet by mouth. Take one in morning and one at night  for laxative   TELMISARTAN (MICARDIS) 80 MG TABLET    Take 80 mg by mouth daily. To control blood pressure   TEMAZEPAM (RESTORIL) 15 MG CAPSULE    15 mg. Take one in evening for rest   TOLNAFTATE (TINACTIN) 1 % CREAM    Apply topically 2 (two) times daily. To rash  Modified Medications   Modified Medication Previous Medication   LORAZEPAM (ATIVAN) 0.5 MG TABLET LORazepam (ATIVAN) 0.5  MG tablet          TAKE 1 TABLET EVERY 12 HOURS AS NEEDED  Discontinued Medications   CONJUGATED ESTROGENS (PREMARIN) VAGINAL CREAM    Place vaginally daily. Use daily to lubricate pessary     Physical Exam: Physical Exam  Constitutional: She is oriented to person, place, and time. She appears well-developed and well-nourished. No distress.  HENT:  Head: Normocephalic and atraumatic.  Eyes: EOM are normal. Pupils are equal, round, and reactive to light. Right eye exhibits no discharge.  Neck: Normal range of motion. Neck supple. No JVD present. No thyromegaly present.  Cardiovascular: Normal rate, regular  rhythm and normal heart sounds.   No murmur heard. Pulmonary/Chest: Effort normal and breath sounds normal. She has no wheezes. She has no rales.  Abdominal: Soft. Bowel sounds are normal. She exhibits no distension. There is no tenderness.  Musculoskeletal: Normal range of motion. She exhibits tenderness (right SIJ, hip, knee tenderness). She exhibits no edema.  Lymphadenopathy:    She has no cervical adenopathy.  Neurological: She is alert and oriented to person, place, and time. She has normal reflexes. She displays normal reflexes. No cranial nerve deficit. She exhibits normal muscle tone. Coordination normal.  Skin: Skin is warm and dry. No rash noted. She is not diaphoretic. No erythema.  Psychiatric: She has a normal mood and affect. Her behavior is normal. Judgment and thought content normal.    Filed Vitals:   02/12/13 1405  BP: 126/70  Pulse: 72  Height: 5' 5.5" (1.664 m)  Weight: 145 lb (65.772 kg)      Labs reviewed: Basic Metabolic Panel:  Recent Labs  07/15/12  NA 141  K 4.1  BUN 21  CREATININE 1.2*  TSH 2.17   Liver Function Tests: No results found for this basename: AST, ALT, ALKPHOS, BILITOT, PROT, ALBUMIN,  in the last 8760 hours No results found for this basename: LIPASE, AMYLASE,  in the last 8760 hours No results found for this basename:  AMMONIA,  in the last 8760 hours CBC:  Recent Labs  07/15/12  WBC 5.5  HGB 13.0  HCT 36  PLT 213   Lipid Panel: No results found for this basename: CHOL, HDL, LDLCALC, TRIG, CHOLHDL, LDLDIRECT,  in the last 8760 hours Anemia Panel: No results found for this basename: FOLATE, IRON, VITAMINB12,  in the last 8760 hours  Past Procedures: 01/22/13 X-ray L spine, pelvis,  and right knee IMPRESSION: Advanced degenerative disc and facet disease.  No acute findings. IMPRESSION: Mild degenerative changes in the patellofemoral compartment. No acute bony abnormality. IMPRESSION: Moderate bilateral hip degenerative changes. No acute bony abnormality.    Assessment/Plan Bladder cystocele Stage III--pessary no longer stay in place--GYN consult.     Generalized anxiety disorder Am Ativan helps temporarily, still c/o not sleeping well at night regardless Temazepam, feels sad. Will try Cymbalta 20mg  daily in setting of chronic back/knee pain. Will change Ativan and Temazepam to prn, f/u BMP    Insomnia Temazepam with questionable efficacy, will started Cymbalta to better manage mood    HTN (hypertension) Controlled on Nifedipine and Labetalol.     Sacroiliac pain SIJ region pain-degenerative changes. Cymbalta may help    Osteoarthritis of right hip Pain when lying on it at night--no change, X-ray showed degenerative change    Osteoarthritis of right knee No change      Family/ Staff Communication: none  Goals of Care:IL and continue PT  Labs/tests ordered: BMP

## 2013-02-12 NOTE — Assessment & Plan Note (Signed)
Am Ativan helps temporarily, still c/o not sleeping well at night regardless Temazepam, feels sad. Will try Cymbalta 20mg  daily in setting of chronic back/knee pain. Will change Ativan and Temazepam to prn, f/u BMP

## 2013-02-12 NOTE — Assessment & Plan Note (Signed)
Controlled on Nifedipine and Labetalol.

## 2013-03-03 ENCOUNTER — Telehealth: Payer: Self-pay

## 2013-03-03 NOTE — Telephone Encounter (Signed)
Called patient to let her know I made appointment with Dr. Matilde Sprang for Friday June 20th at 9:45 for cystocele. Phone [phone] [place].  Avel Sensor CMA.

## 2013-03-18 DIAGNOSIS — H353290 Exudative age-related macular degeneration, unspecified eye, stage unspecified: Secondary | ICD-10-CM | POA: Diagnosis not present

## 2013-03-18 DIAGNOSIS — H35059 Retinal neovascularization, unspecified, unspecified eye: Secondary | ICD-10-CM | POA: Diagnosis not present

## 2013-03-27 DIAGNOSIS — N3946 Mixed incontinence: Secondary | ICD-10-CM | POA: Diagnosis not present

## 2013-03-27 DIAGNOSIS — R350 Frequency of micturition: Secondary | ICD-10-CM | POA: Diagnosis not present

## 2013-03-30 ENCOUNTER — Other Ambulatory Visit: Payer: Self-pay | Admitting: Internal Medicine

## 2013-03-31 ENCOUNTER — Encounter (INDEPENDENT_AMBULATORY_CARE_PROVIDER_SITE_OTHER): Payer: Self-pay | Admitting: General Surgery

## 2013-04-06 ENCOUNTER — Other Ambulatory Visit: Payer: Self-pay | Admitting: Internal Medicine

## 2013-04-11 ENCOUNTER — Other Ambulatory Visit: Payer: Self-pay | Admitting: Internal Medicine

## 2013-04-13 ENCOUNTER — Ambulatory Visit (INDEPENDENT_AMBULATORY_CARE_PROVIDER_SITE_OTHER): Payer: Medicare Other | Admitting: General Surgery

## 2013-04-13 ENCOUNTER — Encounter (INDEPENDENT_AMBULATORY_CARE_PROVIDER_SITE_OTHER): Payer: Self-pay | Admitting: General Surgery

## 2013-04-13 VITALS — BP 138/82 | HR 75 | Temp 97.7°F | Resp 18 | Ht 65.5 in | Wt 147.0 lb

## 2013-04-13 DIAGNOSIS — K623 Rectal prolapse: Secondary | ICD-10-CM | POA: Diagnosis not present

## 2013-04-13 NOTE — Progress Notes (Signed)
Chief Complaint  Patient presents with  . New Evaluation    eval prolapsed hems    HISTORY: Jacqueline Dodson is a 79 y.o. female who presents to the office with rectal tissue prolapsing for about 6 mo.  Other symptoms include bleeding occasional bleeding and mucus discharge.  Constipation makes the symptoms worse.   It is continuous in nature.  Her bowel habits are regular and her bowel movements are soft.  Her fiber intake is good.  She has never had colonoscopy.  She does have prolapsing tissue.     Past Medical History  Diagnosis Date  . Dermatophytosis of the body   . Anxiety   . Restless legs syndrome (RLS)   . Primary cerebellar degeneration   . Unspecified hereditary and idiopathic peripheral neuropathy   . Macular degeneration (senile) of retina, unspecified   . Hypertension   . Left bundle branch hemiblock   . Acute, but ill-defined, cerebrovascular disease   . Hemorrhoids   . Orthostatic hypotension   . GERD (gastroesophageal reflux disease)   . Duodenal ulcer, unspecified as acute or chronic, without hemorrhage, perforation, or obstruction   . Unspecified constipation   . Chronic kidney disease, stage II (mild)   . Cystocele, midline   . Rectocele   . Postmenopausal atrophic vaginitis   . Lumbago   . Myalgia and myositis, unspecified   . Dizziness and giddiness   . Other malaise and fatigue   . Abnormality of gait   . Disturbance of skin sensation   . Edema   . Urgency of urination   . Other abnormal blood chemistry   . Elevated blood pressure reading without diagnosis of hypertension   . Personal history of fall   . Arthritis       Past Surgical History  Procedure Laterality Date  . Tonsillectomy  1940  . Appendectomy  1945  . External ear surgery  2010    left outer ear basel cell removed  . Abdominal hysterectomy  1972        Current Outpatient Prescriptions  Medication Sig Dispense Refill  . labetalol (NORMODYNE) 100 MG tablet       . LORazepam (ATIVAN)  0.5 MG tablet TAKE 1 TABLET EVERY 12 HOURS AS NEEDED  60 tablet  0  . Multiple Vitamins-Minerals (ICAPS) CAPS Take by mouth. Take one daily      . Multiple Vitamins-Minerals (MULTIVITAMIN WITH MINERALS) tablet Take 1 tablet by mouth daily.      Marland Kitchen NIFEdipine (PROCARDIA-XL/ADALAT CC) 60 MG 24 hr tablet Take 60 mg by mouth. Take one daly to control blood pressure      . psyllium (METAMUCIL) 58.6 % powder Take 1 packet by mouth 3 (three) times daily.      . Ranibizumab (LUCENTIS) 0.3 MG/0.05ML SOLN Inject into the eye.      . telmisartan (MICARDIS) 80 MG tablet TAKE 1 TABLET BY MOUTH EVERY DAY FOR BLOOD PRESSURE  30 tablet  3  . temazepam (RESTORIL) 15 MG capsule 15 mg. Take one in evening for rest      . tolnaftate (TINACTIN) 1 % cream Apply topically 2 (two) times daily. To rash       No current facility-administered medications for this visit.      Allergies  Allergen Reactions  . Amlodipine   . Amoxicillin   . Enalapril   . Hctz (Hydrochlorothiazide)   . Penicillins       Family History  Problem Relation Age of Onset  .  Aortic stenosis Mother   . Heart failure Mother   . Thrombosis Father   . Coronary artery disease Father   . Ataxia Sister   . Ataxia Brother   . Heart disease Brother   . Cancer Brother     Prostate  . Stroke Brother     History   Social History  . Marital Status: Widowed    Spouse Name: N/A    Number of Children: N/A  . Years of Education: N/A   Social History Main Topics  . Smoking status: Never Smoker   . Smokeless tobacco: Never Used  . Alcohol Use: No  . Drug Use: No  . Sexually Active: No   Other Topics Concern  . None   Social History Narrative  . None      REVIEW OF SYSTEMS - PERTINENT POSITIVES ONLY: Review of Systems - General ROS: negative for - chills, fever or weight loss Hematological and Lymphatic ROS: negative for - bleeding problems, blood clots or bruising Respiratory ROS: no cough, shortness of breath, or  wheezing Cardiovascular ROS: no chest pain or dyspnea on exertion Gastrointestinal ROS: no abdominal pain, change in bowel habits, or black or bloody stools Genito-Urinary ROS: no dysuria, trouble voiding, or hematuria  EXAM: Filed Vitals:   04/13/13 1556  BP: 138/82  Pulse: 75  Temp: 97.7 F (36.5 C)  Resp: 18    General appearance: alert and cooperative Resp: clear to auscultation bilaterally Cardio: regular rate and rhythm GI: soft, non-tender; bowel sounds normal; no masses,  no organomegaly   Procedure: Anoscopy Surgeon: Marcello Moores Diagnosis: rectal prolapse  Assistant: Alphonzo Severance After the risks and benefits were explained, verbal consent was obtained for above procedure  Anesthesia: none Findings: good resting tone a squeeze, partial rectal mucosal prolapse (reducible), minimal hemorrhoidal disease    ASSESSMENT AND PLAN: Jacqueline Dodson is a 79 y.o. F who is seeing Dr Matilde Sprang for symptomatic prolapse.  She was referred to me to evaluate rectal prolapse.  On exam she appears to have rectal mucosa prolapse in 2 distinct places.  I think she would benefit from resection and mucopexy.  She would like to wait and see what Dr Matilde Sprang recommends for her bladder and possibly do a joint procedure.  She will call the office when she decides to schedule a surgery.     Rosario Adie, MD Colon and Rectal Surgery / [facility]      Visit Diagnoses: 1. Rectal mucosa prolapse     Primary Care Physician: Estill Dooms, MD

## 2013-04-13 NOTE — Patient Instructions (Signed)
Call the office if you would like to have a surgery to fix your mucosa prolapse

## 2013-04-14 ENCOUNTER — Other Ambulatory Visit: Payer: Self-pay | Admitting: Geriatric Medicine

## 2013-04-14 MED ORDER — NIFEDIPINE ER 60 MG PO TB24: 60.0000 mg | ORAL_TABLET | Freq: Every day | ORAL | Status: DC

## 2013-04-29 DIAGNOSIS — H353290 Exudative age-related macular degeneration, unspecified eye, stage unspecified: Secondary | ICD-10-CM | POA: Diagnosis not present

## 2013-04-29 DIAGNOSIS — H35059 Retinal neovascularization, unspecified, unspecified eye: Secondary | ICD-10-CM | POA: Diagnosis not present

## 2013-05-04 ENCOUNTER — Other Ambulatory Visit: Payer: Self-pay | Admitting: Geriatric Medicine

## 2013-05-04 MED ORDER — LORAZEPAM 0.5 MG PO TABS: 0.5000 mg | ORAL_TABLET | Freq: Two times a day (BID) | ORAL | Status: DC

## 2013-05-21 ENCOUNTER — Non-Acute Institutional Stay: Payer: Medicare Other | Admitting: Nurse Practitioner

## 2013-05-21 ENCOUNTER — Encounter: Payer: Self-pay | Admitting: Nurse Practitioner

## 2013-05-21 VITALS — BP 120/64 | HR 76 | Ht 65.5 in | Wt 147.0 lb

## 2013-05-21 DIAGNOSIS — G4700 Insomnia, unspecified: Secondary | ICD-10-CM | POA: Diagnosis not present

## 2013-05-21 DIAGNOSIS — I10 Essential (primary) hypertension: Secondary | ICD-10-CM

## 2013-05-21 DIAGNOSIS — K623 Rectal prolapse: Secondary | ICD-10-CM

## 2013-05-21 DIAGNOSIS — N8111 Cystocele, midline: Secondary | ICD-10-CM

## 2013-05-21 DIAGNOSIS — F411 Generalized anxiety disorder: Secondary | ICD-10-CM

## 2013-05-21 DIAGNOSIS — IMO0002 Reserved for concepts with insufficient information to code with codable children: Secondary | ICD-10-CM

## 2013-05-21 DIAGNOSIS — M533 Sacrococcygeal disorders, not elsewhere classified: Secondary | ICD-10-CM | POA: Diagnosis not present

## 2013-05-21 NOTE — Assessment & Plan Note (Addendum)
Stopped taking Temazepam--Ativan 0.5mg  bid helps

## 2013-05-21 NOTE — Assessment & Plan Note (Signed)
Surgical repair when she desires.

## 2013-05-21 NOTE — Progress Notes (Signed)
Patient ID: Jacqueline Dodson, female   DOB: [birth year], 79 y.o.   MRN: [id number]   Allergies  Allergen Reactions  . Amlodipine   . Amoxicillin   . Enalapril   . Hctz [Hydrochlorothiazide]   . Penicillins     Chief Complaint  Patient presents with  . Medical Managment of Chronic Issues    blood pressure, anxiety, hip and knee pain. Patient stopped the Temazepam about two weeks ago. Doing ok, just wanted to try to get along without it.     HPI: Patient is a 79 y.o. female seen in the clinic at [facility] today for evaluation of her chronic medical conditions.  Problem List Items Addressed This Visit   Bladder cystocele     Saw Urology and not a surgical candidate per the patient.     Generalized anxiety disorder (Chronic)     Better with Ativan 0.5mg  bid.       HTN (hypertension)     Controlled on Nifedipine 60mg   and Labetalol 100mg  and Telmisartan 80mg          Insomnia     Stopped taking Temazepam--Ativan 0.5mg  bid helps      Rectal mucosa prolapse     Surgical repair when she desires.     Sacroiliac pain - Primary     SIJ region pain-degenerative changes-resolved after working with PT           Review of Systems:  Review of Systems  Constitutional: Negative for fever, chills, weight loss, malaise/fatigue and diaphoresis.  HENT: Negative for hearing loss, ear pain, congestion, sore throat and neck pain.   Eyes: Negative for pain, discharge and redness.  Respiratory: Negative for cough, sputum production, shortness of breath and wheezing.   Cardiovascular: Negative for chest pain, palpitations, orthopnea, claudication, leg swelling and PND.  Gastrointestinal: Negative for nausea, vomiting, abdominal pain, diarrhea and constipation.  Genitourinary: Positive for frequency. Negative for dysuria, urgency and flank pain.       Cystocele stage III out side her body now.   Musculoskeletal: Positive for back pain (right SIJ-reoslved. ) and joint pain (right  hip and knee, much improved. ). Negative for myalgias and falls.  Skin: Negative for itching and rash.  Neurological: Positive for sensory change (was peripheral neuropathy in RLE--better now). Negative for dizziness, tremors, speech change, focal weakness, seizures, loss of consciousness, weakness and headaches.  Endo/Heme/Allergies: Negative for environmental allergies and polydipsia. Does not bruise/bleed easily.  Psychiatric/Behavioral: Negative for depression, hallucinations and memory loss. The patient is nervous/anxious (better managed with Ativan. ) and has insomnia (improved. ).      Past Medical History  Diagnosis Date  . Dermatophytosis of the body   . Anxiety   . Restless legs syndrome (RLS)   . Primary cerebellar degeneration   . Unspecified hereditary and idiopathic peripheral neuropathy   . Macular degeneration (senile) of retina, unspecified   . Hypertension   . Left bundle branch hemiblock   . Acute, but ill-defined, cerebrovascular disease   . Hemorrhoids   . Orthostatic hypotension   . GERD (gastroesophageal reflux disease)   . Duodenal ulcer, unspecified as acute or chronic, without hemorrhage, perforation, or obstruction   . Unspecified constipation   . Chronic kidney disease, stage II (mild)   . Cystocele, midline   . Rectocele   . Postmenopausal atrophic vaginitis   . Lumbago   . Myalgia and myositis, unspecified   . Dizziness and giddiness   . Other malaise and fatigue   .  Abnormality of gait   . Disturbance of skin sensation   . Edema   . Urgency of urination   . Other abnormal blood chemistry   . Elevated blood pressure reading without diagnosis of hypertension   . Personal history of fall   . Arthritis    Past Surgical History  Procedure Laterality Date  . Tonsillectomy  1940  . Appendectomy  1945  . External ear surgery  2010    left outer ear basel cell removed  . Abdominal hysterectomy  1972   Social History:   reports that she has never  smoked. She has never used smokeless tobacco. She reports that she does not drink alcohol or use illicit drugs.  Family History  Problem Relation Age of Onset  . Aortic stenosis Mother   . Heart failure Mother   . Thrombosis Father   . Coronary artery disease Father   . Ataxia Sister   . Ataxia Brother   . Heart disease Brother   . Cancer Brother     Prostate  . Stroke Brother     Medications: Patient's Medications  New Prescriptions   No medications on file  Previous Medications   LABETALOL (NORMODYNE) 100 MG TABLET       LORAZEPAM (ATIVAN) 0.5 MG TABLET    Take 1 tablet (0.5 mg total) by mouth every 12 (twelve) hours.   MULTIPLE VITAMINS-MINERALS (ICAPS) CAPS    Take by mouth. Take one daily   MULTIPLE VITAMINS-MINERALS (MULTIVITAMIN WITH MINERALS) TABLET    Take 1 tablet by mouth daily.   NIFEDIPINE (PROCARDIA-XL/ADALAT CC) 60 MG 24 HR TABLET    Take 1 tablet (60 mg total) by mouth daily. Take one daly to control blood pressure   PSYLLIUM (METAMUCIL) 58.6 % POWDER    Take 1 packet by mouth 3 (three) times daily.   RANIBIZUMAB (LUCENTIS) 0.3 MG/0.05ML SOLN    Inject into the eye.   TELMISARTAN (MICARDIS) 80 MG TABLET    TAKE 1 TABLET BY MOUTH EVERY DAY FOR BLOOD PRESSURE   TOLNAFTATE (TINACTIN) 1 % CREAM    Apply topically 2 (two) times daily. To rash  Modified Medications   No medications on file  Discontinued Medications   TEMAZEPAM (RESTORIL) 15 MG CAPSULE    15 mg. Take one in evening for rest     Physical Exam: Physical Exam  Constitutional: She is oriented to person, place, and time. She appears well-developed and well-nourished. No distress.  HENT:  Head: Normocephalic and atraumatic.  Eyes: EOM are normal. Pupils are equal, round, and reactive to light. Right eye exhibits no discharge.  Neck: Normal range of motion. Neck supple. No JVD present. No thyromegaly present.  Cardiovascular: Normal rate, regular rhythm and normal heart sounds.   No murmur  heard. Pulmonary/Chest: Effort normal and breath sounds normal. She has no wheezes. She has no rales.  Abdominal: Soft. Bowel sounds are normal. She exhibits no distension. There is no tenderness.  Musculoskeletal: Normal range of motion. She exhibits tenderness (right SIJ, hip, knee tenderness, resolved. ). She exhibits no edema.  Lymphadenopathy:    She has no cervical adenopathy.  Neurological: She is alert and oriented to person, place, and time. She has normal reflexes. She displays normal reflexes. No cranial nerve deficit. She exhibits normal muscle tone. Coordination normal.  Skin: Skin is warm and dry. No rash noted. She is not diaphoretic. No erythema.  Psychiatric: She has a normal mood and affect. Her behavior is normal. Judgment and  thought content normal.    Filed Vitals:   05/21/13 1348  BP: 120/64  Pulse: 76  Height: 5' 5.5" (1.664 m)  Weight: 147 lb (66.679 kg)      Labs reviewed: Basic Metabolic Panel:  Recent Labs  07/15/12  NA 141  K 4.1  BUN 21  CREATININE 1.2*  TSH 2.17   CBC:  Recent Labs  07/15/12  WBC 5.5  HGB 13.0  HCT 36  PLT 213    Past Procedures: 01/22/13 X-ray R knee showed mild degenerative changes. R pelvic and bilateral hips: advanced degenerative disc ?facet.     Assessment/Plan Sacroiliac pain SIJ region pain-degenerative changes-resolved after working with PT      HTN (hypertension) Controlled on Nifedipine 60mg   and Labetalol 100mg  and Telmisartan 80mg        Insomnia Stopped taking Temazepam--Ativan 0.5mg  bid helps    Generalized anxiety disorder Better with Ativan 0.5mg  bid.     Rectal mucosa prolapse Surgical repair when she desires.   Bladder cystocele Saw Urology and not a surgical candidate per the patient.     Family/ Staff Communication: observe the patient.   Goals of Care: [place]  Labs/tests ordered: none

## 2013-05-21 NOTE — Assessment & Plan Note (Addendum)
Controlled on Nifedipine 60mg   and Labetalol 100mg  and Telmisartan 80mg 

## 2013-05-21 NOTE — Assessment & Plan Note (Signed)
SIJ region pain-degenerative changes-resolved after working with PT

## 2013-05-21 NOTE — Assessment & Plan Note (Signed)
Better with Ativan 0.5mg  bid.

## 2013-05-21 NOTE — Assessment & Plan Note (Signed)
Saw Urology and not a surgical candidate per the patient.

## 2013-06-01 ENCOUNTER — Other Ambulatory Visit: Payer: Self-pay | Admitting: Internal Medicine

## 2013-06-01 DIAGNOSIS — D239 Other benign neoplasm of skin, unspecified: Secondary | ICD-10-CM | POA: Diagnosis not present

## 2013-06-01 DIAGNOSIS — L570 Actinic keratosis: Secondary | ICD-10-CM | POA: Diagnosis not present

## 2013-06-03 DIAGNOSIS — H353290 Exudative age-related macular degeneration, unspecified eye, stage unspecified: Secondary | ICD-10-CM | POA: Diagnosis not present

## 2013-06-03 DIAGNOSIS — H35059 Retinal neovascularization, unspecified, unspecified eye: Secondary | ICD-10-CM | POA: Diagnosis not present

## 2013-06-18 DIAGNOSIS — N182 Chronic kidney disease, stage 2 (mild): Secondary | ICD-10-CM | POA: Diagnosis not present

## 2013-06-18 DIAGNOSIS — N2581 Secondary hyperparathyroidism of renal origin: Secondary | ICD-10-CM | POA: Diagnosis not present

## 2013-06-18 LAB — CBC AND DIFFERENTIAL
HCT: 39 % (ref 36–46)
Hemoglobin: 13.3 g/dL (ref 12.0–16.0)

## 2013-06-18 LAB — BASIC METABOLIC PANEL
BUN: 21 mg/dL (ref 4–21)
CREATININE: 1.2 mg/dL — AB (ref 0.5–1.1)
Glucose: 112 mg/dL
POTASSIUM: 4.4 mmol/L (ref 3.4–5.3)
SODIUM: 140 mmol/L (ref 137–147)

## 2013-06-25 DIAGNOSIS — N2581 Secondary hyperparathyroidism of renal origin: Secondary | ICD-10-CM | POA: Diagnosis not present

## 2013-06-25 DIAGNOSIS — R5381 Other malaise: Secondary | ICD-10-CM | POA: Diagnosis not present

## 2013-06-25 DIAGNOSIS — I129 Hypertensive chronic kidney disease with stage 1 through stage 4 chronic kidney disease, or unspecified chronic kidney disease: Secondary | ICD-10-CM | POA: Diagnosis not present

## 2013-06-25 DIAGNOSIS — N182 Chronic kidney disease, stage 2 (mild): Secondary | ICD-10-CM | POA: Diagnosis not present

## 2013-07-08 DIAGNOSIS — H353290 Exudative age-related macular degeneration, unspecified eye, stage unspecified: Secondary | ICD-10-CM | POA: Diagnosis not present

## 2013-07-08 DIAGNOSIS — H35059 Retinal neovascularization, unspecified, unspecified eye: Secondary | ICD-10-CM | POA: Diagnosis not present

## 2013-07-22 DIAGNOSIS — Z23 Encounter for immunization: Secondary | ICD-10-CM | POA: Diagnosis not present

## 2013-08-03 ENCOUNTER — Other Ambulatory Visit: Payer: Self-pay | Admitting: Internal Medicine

## 2013-08-12 DIAGNOSIS — H35059 Retinal neovascularization, unspecified, unspecified eye: Secondary | ICD-10-CM | POA: Diagnosis not present

## 2013-08-12 DIAGNOSIS — H353290 Exudative age-related macular degeneration, unspecified eye, stage unspecified: Secondary | ICD-10-CM | POA: Diagnosis not present

## 2013-09-14 ENCOUNTER — Other Ambulatory Visit: Payer: Self-pay | Admitting: Internal Medicine

## 2013-09-16 DIAGNOSIS — H35359 Cystoid macular degeneration, unspecified eye: Secondary | ICD-10-CM | POA: Diagnosis not present

## 2013-09-16 DIAGNOSIS — H353290 Exudative age-related macular degeneration, unspecified eye, stage unspecified: Secondary | ICD-10-CM | POA: Diagnosis not present

## 2013-09-16 DIAGNOSIS — H35059 Retinal neovascularization, unspecified, unspecified eye: Secondary | ICD-10-CM | POA: Diagnosis not present

## 2013-09-25 ENCOUNTER — Other Ambulatory Visit: Payer: Self-pay | Admitting: Nurse Practitioner

## 2013-10-21 DIAGNOSIS — H35359 Cystoid macular degeneration, unspecified eye: Secondary | ICD-10-CM | POA: Diagnosis not present

## 2013-10-21 DIAGNOSIS — H35059 Retinal neovascularization, unspecified, unspecified eye: Secondary | ICD-10-CM | POA: Diagnosis not present

## 2013-10-21 DIAGNOSIS — H353290 Exudative age-related macular degeneration, unspecified eye, stage unspecified: Secondary | ICD-10-CM | POA: Diagnosis not present

## 2013-11-02 ENCOUNTER — Other Ambulatory Visit: Payer: Self-pay | Admitting: Nurse Practitioner

## 2013-11-19 ENCOUNTER — Encounter: Payer: Self-pay | Admitting: Nurse Practitioner

## 2013-11-30 ENCOUNTER — Other Ambulatory Visit: Payer: Self-pay | Admitting: Nurse Practitioner

## 2013-11-30 DIAGNOSIS — H353290 Exudative age-related macular degeneration, unspecified eye, stage unspecified: Secondary | ICD-10-CM | POA: Diagnosis not present

## 2013-11-30 DIAGNOSIS — H35359 Cystoid macular degeneration, unspecified eye: Secondary | ICD-10-CM | POA: Diagnosis not present

## 2013-11-30 DIAGNOSIS — H35059 Retinal neovascularization, unspecified, unspecified eye: Secondary | ICD-10-CM | POA: Diagnosis not present

## 2013-12-03 ENCOUNTER — Encounter: Payer: Self-pay | Admitting: Nurse Practitioner

## 2013-12-16 IMAGING — CR DG KNEE 1-2V*R*
2 series · 2 of 2 positions shown · non-contrast
Comparison: None.

CLINICAL DATA: Right knee pain.

RIGHT KNEE - 1-2 VIEW

[view not recorded (1 of 2)]
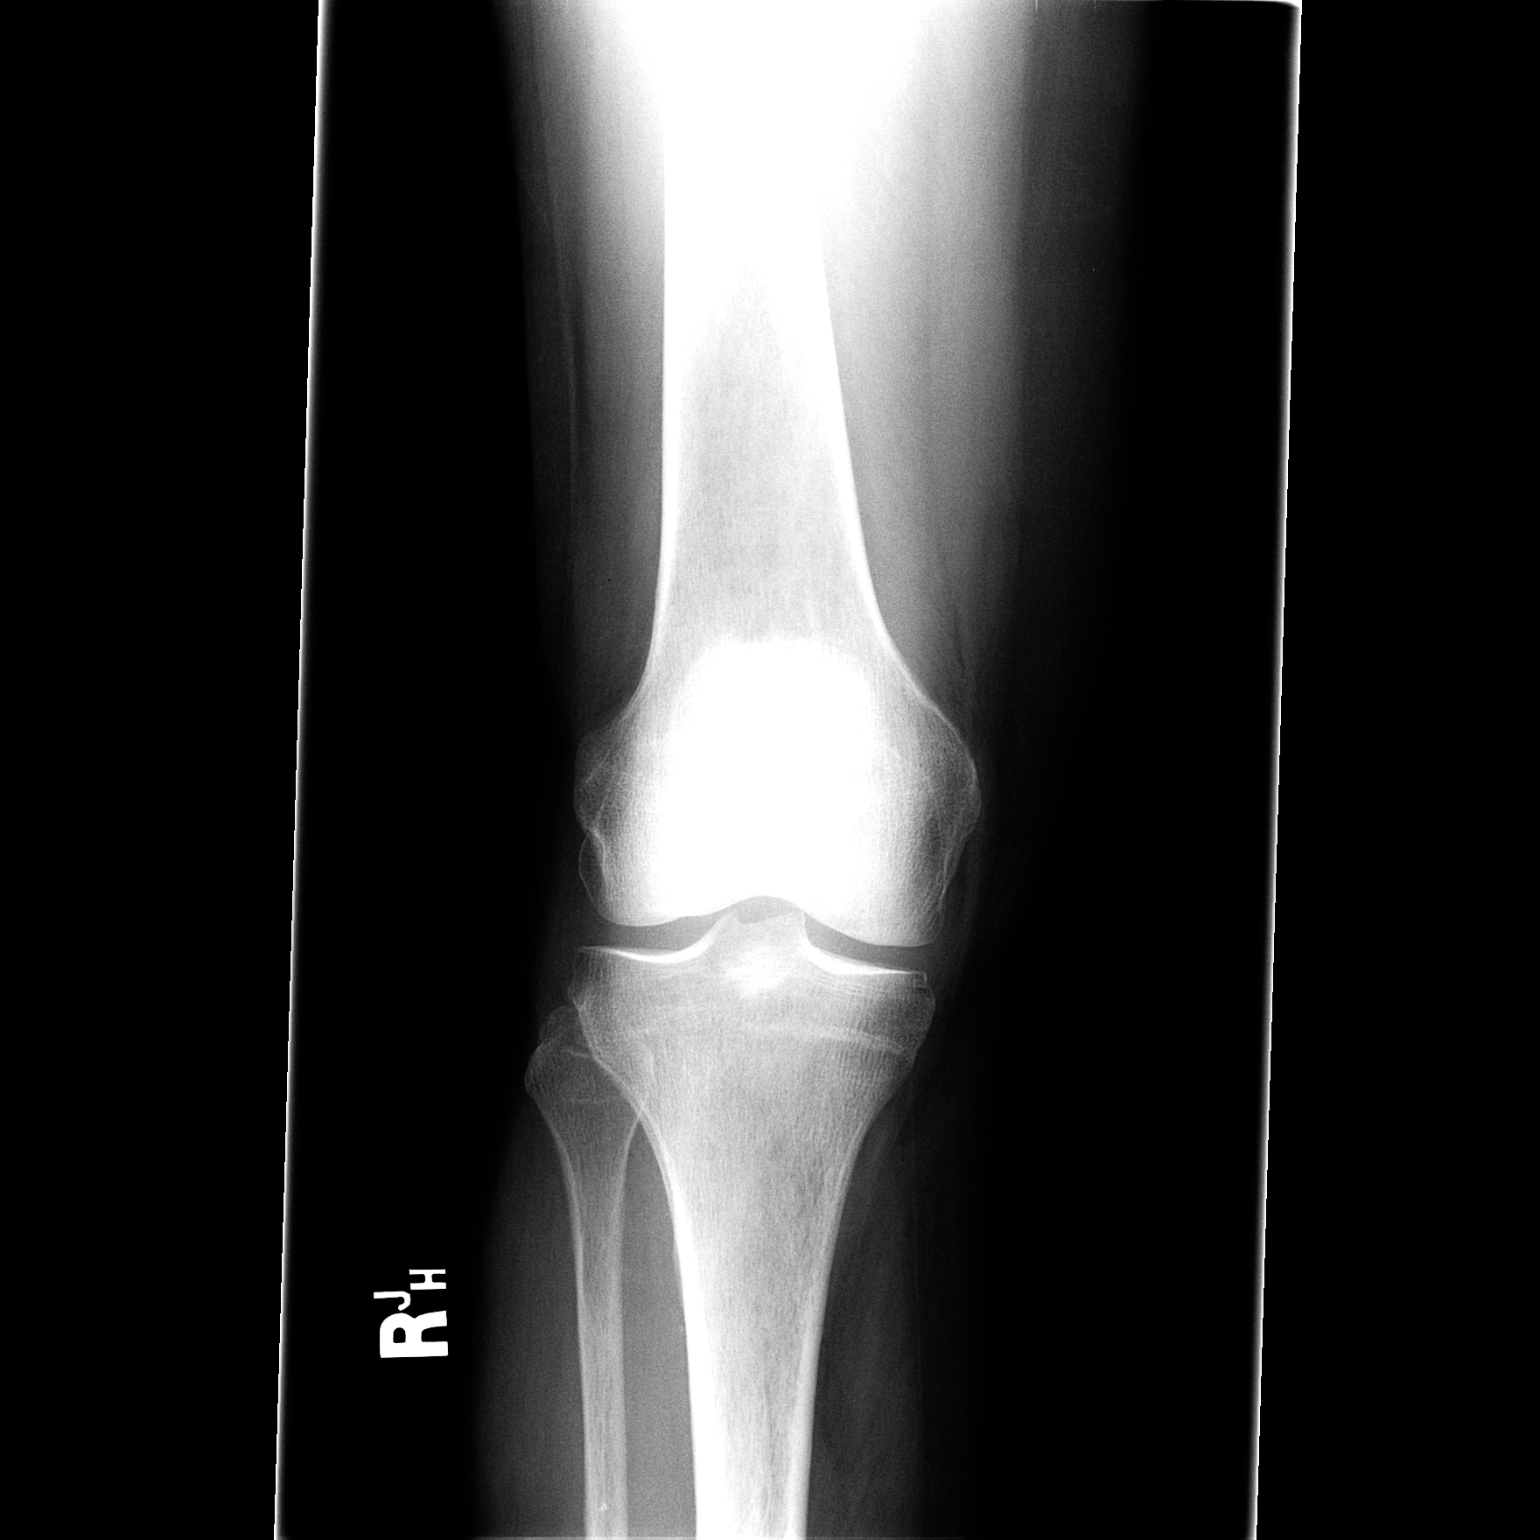

[view not recorded (2 of 2)]
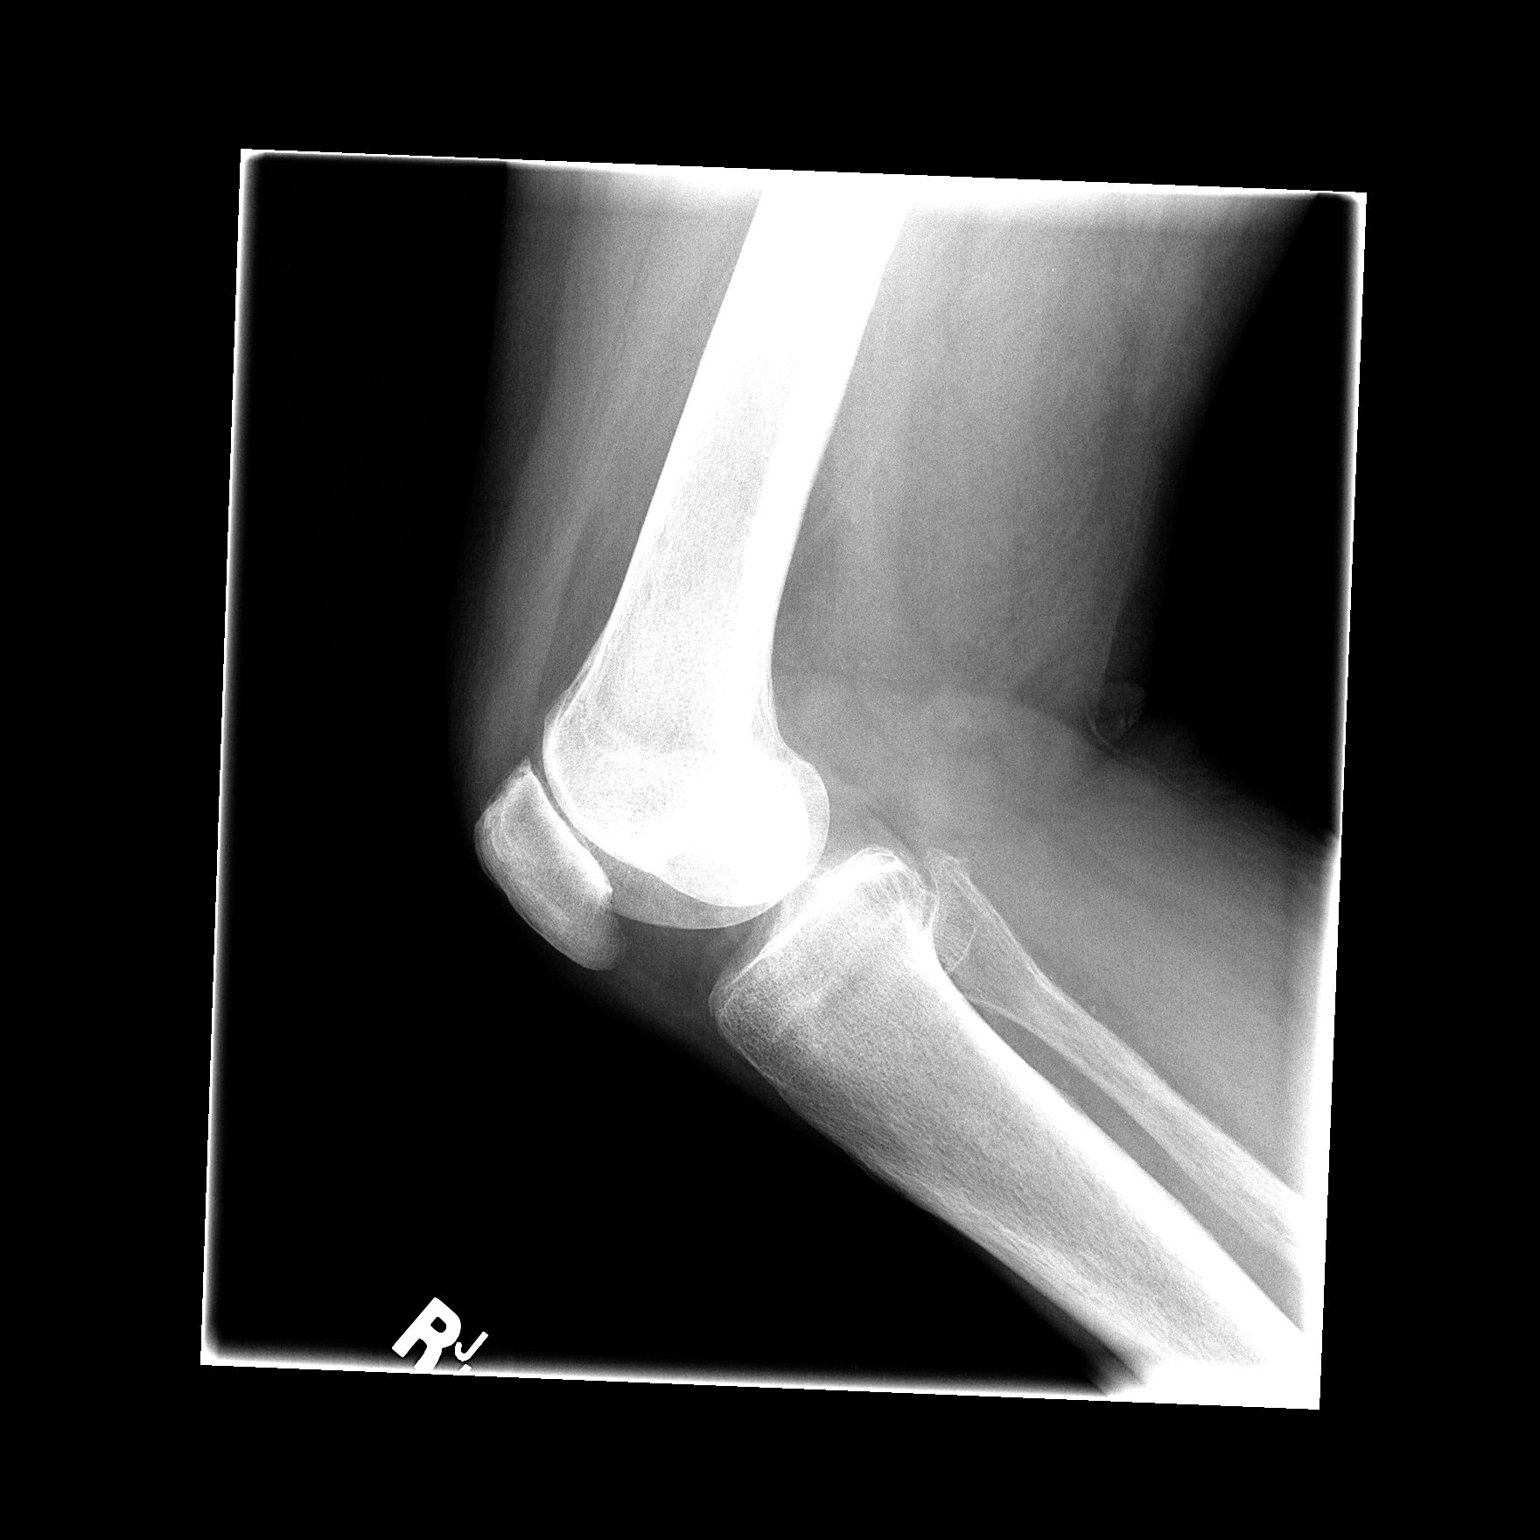

[2 of 2 positions shown; findings below may reference images not displayed]

FINDINGS: Mild joint space narrowing and patellofemoral
compartment. No acute bony abnormality.  Specifically, no fracture,
subluxation, or dislocation.  Soft tissues are intact.  No joint
effusion.
IMPRESSION: Mild degenerative changes in the patellofemoral compartment. No
acute bony abnormality.

## 2013-12-16 IMAGING — CR DG LUMBAR SPINE COMPLETE 4+V
5 series · 5 of 5 positions shown · non-contrast
Comparison: None.

CLINICAL DATA: SI joint pain.

LUMBAR SPINE - COMPLETE 4+ VIEW

[view not recorded (1 of 5)]
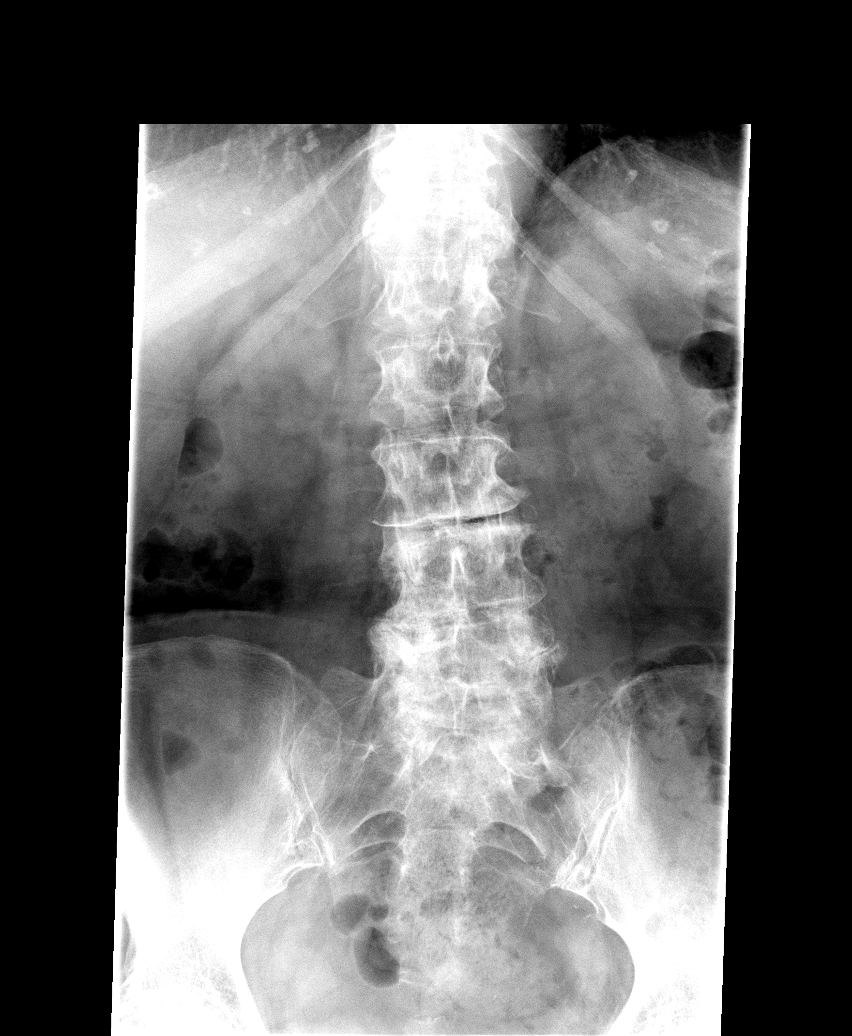

[view not recorded (2 of 5)]
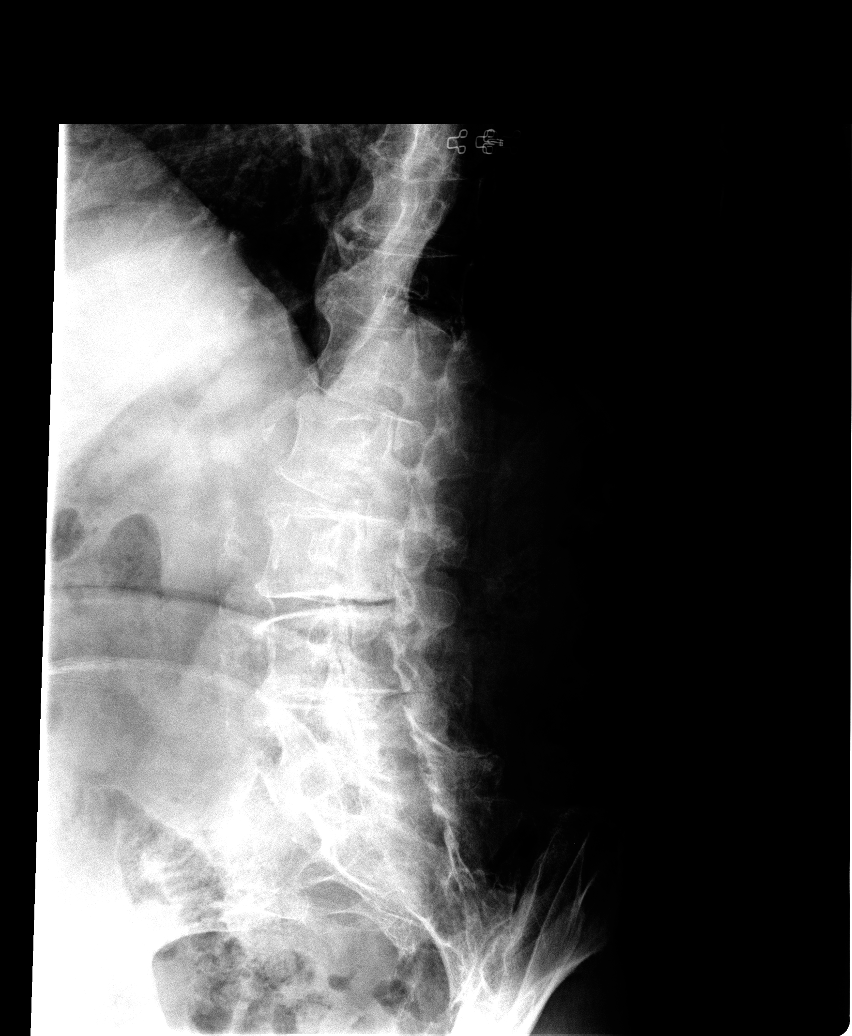

[view not recorded (3 of 5)]
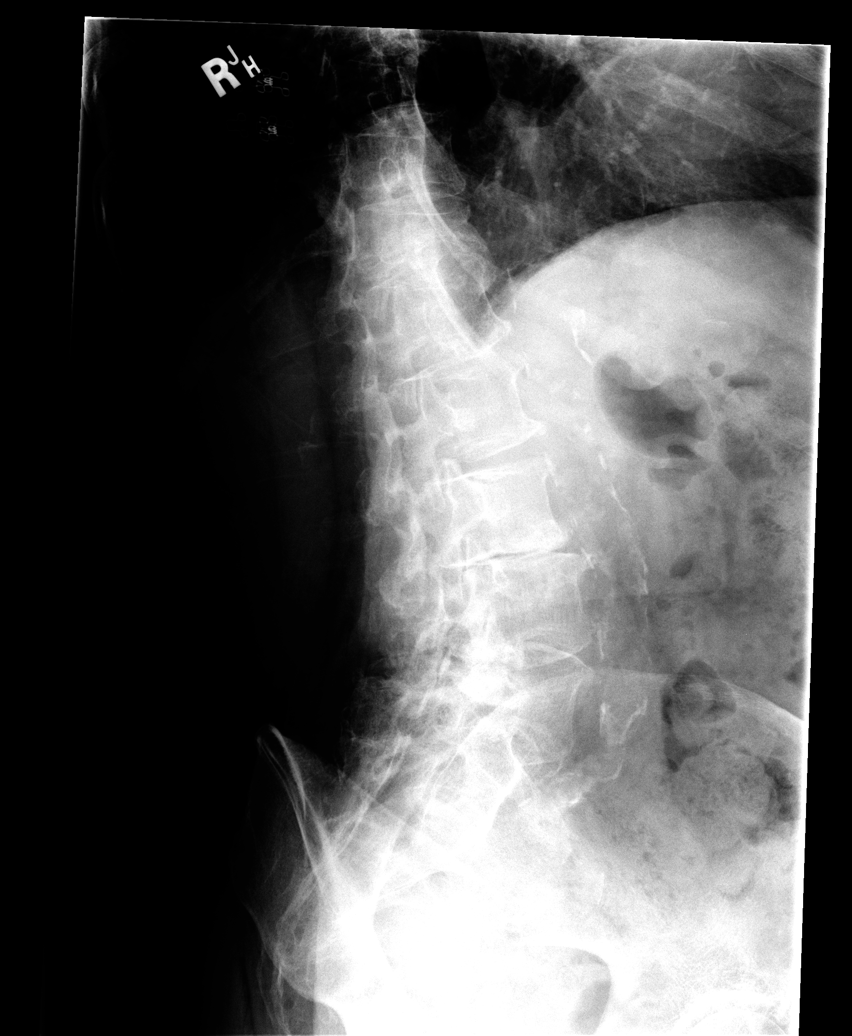

[view not recorded (4 of 5)]
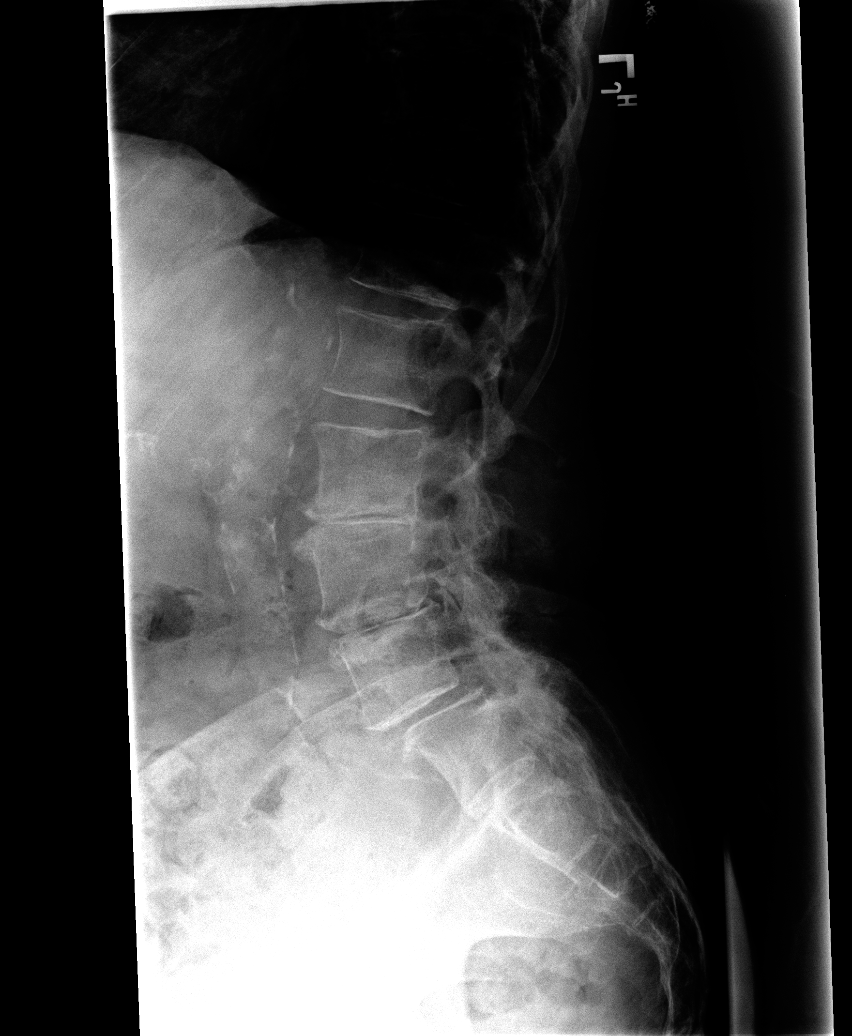

[view not recorded (5 of 5)]
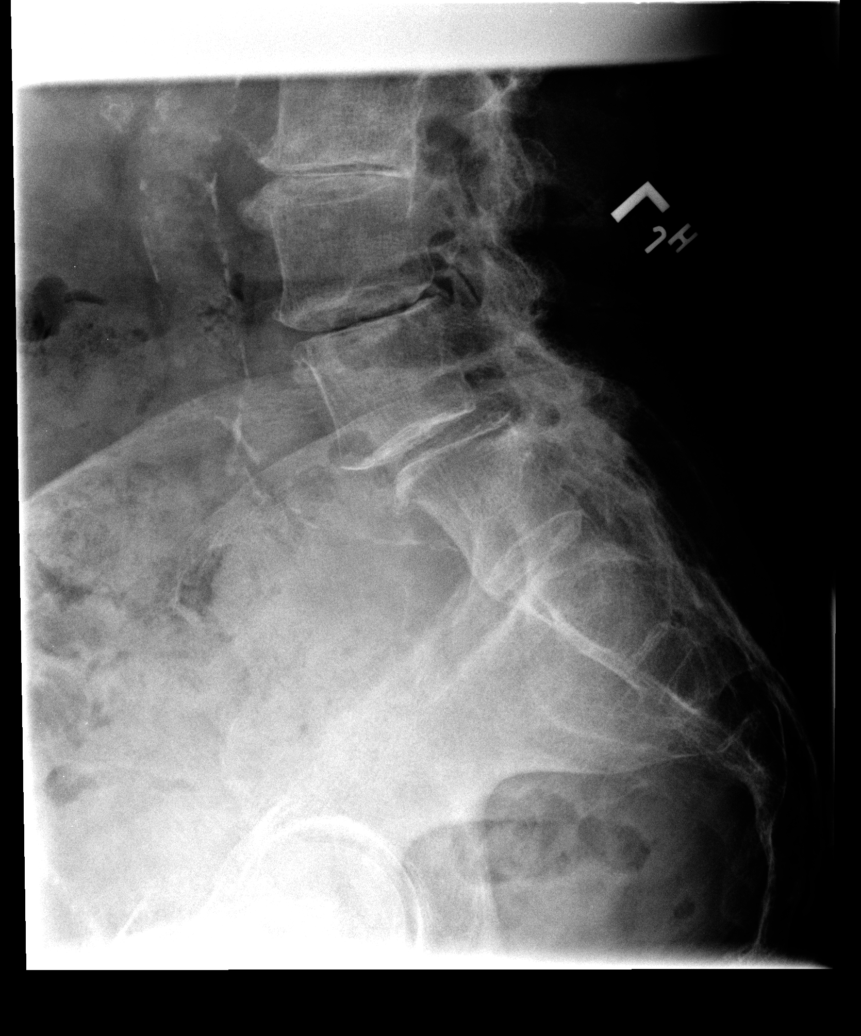

[5 of 5 positions shown; findings below may reference images not displayed]

FINDINGS: Degenerative disc and facet disease throughout the lumbar
spine.  7 mm of anterolisthesis of L4 on L5.  No fracture.  SI
joints are symmetric and unremarkable.
IMPRESSION: Advanced degenerative disc and facet disease.  No acute findings.

## 2013-12-17 ENCOUNTER — Encounter: Payer: Self-pay | Admitting: Nurse Practitioner

## 2013-12-17 ENCOUNTER — Non-Acute Institutional Stay: Payer: Medicare Other | Admitting: Nurse Practitioner

## 2013-12-17 VITALS — BP 144/70 | HR 72 | Wt 147.0 lb

## 2013-12-17 DIAGNOSIS — G4700 Insomnia, unspecified: Secondary | ICD-10-CM | POA: Diagnosis not present

## 2013-12-17 DIAGNOSIS — G609 Hereditary and idiopathic neuropathy, unspecified: Secondary | ICD-10-CM | POA: Diagnosis not present

## 2013-12-17 DIAGNOSIS — I10 Essential (primary) hypertension: Secondary | ICD-10-CM | POA: Diagnosis not present

## 2013-12-17 DIAGNOSIS — F411 Generalized anxiety disorder: Secondary | ICD-10-CM

## 2013-12-17 DIAGNOSIS — G629 Polyneuropathy, unspecified: Secondary | ICD-10-CM

## 2013-12-17 DIAGNOSIS — K5900 Constipation, unspecified: Secondary | ICD-10-CM

## 2013-12-17 NOTE — Progress Notes (Signed)
Patient ID: Jacqueline Dodson, female   DOB: 1927/04/08, 80 y.o.   MRN: [id number]    Allergies  Allergen Reactions  . Amlodipine   . Amoxicillin   . Enalapril   . Hctz [Hydrochlorothiazide]   . Penicillins     Chief Complaint  Patient presents with  . Medical Managment of Chronic Issues    blood pressure, anxiety, insomnia    HPI: Patient is a 80 y.o. female seen in the clinic at [facility] today for evaluation of her chronic medical conditions.  Problem List Items Addressed This Visit   Unspecified constipation     Stable. Takes Psyllium tid.     Peripheral neuropathy     Stated she had nerve conduction study. Has off and on prickling and burning sensation, spasm, shooting pain. [facility] referral. Didn't desire any pain management.     Insomnia     Stopped taking Temazepam--then Ativan 0.5mg  bid then 0.25mg  qod-successful dose reduction.       HTN (hypertension) - Primary     Controlled on Nifedipine 60mg   and Labetalol 100mg  and Telmisartan 80mg         Generalized anxiety disorder (Chronic)     Able to reduce Ativan form 0.5mg  bid to 0.25mg  qod.         Review of Systems:  Review of Systems  Constitutional: Negative for fever, chills, weight loss, malaise/fatigue and diaphoresis.  HENT: Negative for congestion, ear pain, hearing loss and sore throat.   Eyes: Negative for pain, discharge and redness.  Respiratory: Negative for cough, sputum production, shortness of breath and wheezing.   Cardiovascular: Negative for chest pain, palpitations, orthopnea, claudication, leg swelling and PND.  Gastrointestinal: Negative for nausea, vomiting, abdominal pain, diarrhea and constipation.  Genitourinary: Positive for frequency. Negative for dysuria, urgency and flank pain.       Cystocele stage III out side her body now.   Musculoskeletal: Positive for back pain (right SIJ-reoslved. ) and joint pain (right hip and knee, much improved. ). Negative for falls,  myalgias and neck pain.  Skin: Negative for itching and rash.  Neurological: Positive for tingling and sensory change (was peripheral neuropathy in RLE--better now). Negative for dizziness, tremors, speech change, focal weakness, seizures, loss of consciousness, weakness and headaches.       Tingling and burning sensation, muscle spasm, shooting pain in BLE on and off.   Endo/Heme/Allergies: Negative for environmental allergies and polydipsia. Does not bruise/bleed easily.  Psychiatric/Behavioral: Negative for depression, hallucinations and memory loss. The patient is nervous/anxious (better managed with Ativan. ) and has insomnia (improved. ).      Past Medical History  Diagnosis Date  . Dermatophytosis of the body   . Anxiety   . Restless legs syndrome (RLS)   . Primary cerebellar degeneration   . Unspecified hereditary and idiopathic peripheral neuropathy   . Macular degeneration (senile) of retina, unspecified   . Hypertension   . Left bundle branch hemiblock   . Acute, but ill-defined, cerebrovascular disease   . Hemorrhoids   . Orthostatic hypotension   . GERD (gastroesophageal reflux disease)   . Duodenal ulcer, unspecified as acute or chronic, without hemorrhage, perforation, or obstruction   . Unspecified constipation   . Chronic kidney disease, stage II (mild)   . Cystocele, midline   . Rectocele   . Postmenopausal atrophic vaginitis   . Lumbago   . Myalgia and myositis, unspecified   . Dizziness and giddiness   . Other malaise and fatigue   .  Abnormality of gait   . Disturbance of skin sensation   . Edema   . Urgency of urination   . Other abnormal blood chemistry   . Elevated blood pressure reading without diagnosis of hypertension   . Personal history of fall   . Arthritis    Past Surgical History  Procedure Laterality Date  . Tonsillectomy  1940  . Appendectomy  1945  . External ear surgery  2010    left outer ear basel cell removed  . Abdominal  hysterectomy  1972   Social History:   reports that she has never smoked. She has never used smokeless tobacco. She reports that she does not drink alcohol or use illicit drugs.  Family History  Problem Relation Age of Onset  . Aortic stenosis Mother   . Heart failure Mother   . Thrombosis Father   . Coronary artery disease Father   . Ataxia Sister   . Ataxia Brother   . Heart disease Brother   . Cancer Brother     Prostate  . Stroke Brother     Medications: Patient's Medications  New Prescriptions   No medications on file  Previous Medications   LABETALOL (NORMODYNE) 100 MG TABLET       LABETALOL (NORMODYNE) 100 MG TABLET    TAKE 1 TABLET TWICE A DAY TO CONTROL BLOOD PRESSURE   LORAZEPAM (ATIVAN) 0.5 MG TABLET    TAKE 1 TABLET EVERY 12 HOURS   MULTIPLE VITAMINS-MINERALS (ICAPS) CAPS    Take by mouth. Take one daily   MULTIPLE VITAMINS-MINERALS (MULTIVITAMIN WITH MINERALS) TABLET    Take 1 tablet by mouth daily.   NIFEDIPINE (PROCARDIA XL/ADALAT-CC) 60 MG 24 HR TABLET    TAKE 1 TABLET BY MOUTH DAILY TO CONTROL BLOOD PRESSURE   PSYLLIUM (METAMUCIL) 58.6 % POWDER    Take 1 packet by mouth 3 (three) times daily.   RANIBIZUMAB (LUCENTIS) 0.3 MG/0.05ML SOLN    Inject into the eye.   TELMISARTAN (MICARDIS) 80 MG TABLET    TAKE 1 TABLET BY MOUTH EVERY DAY FOR BLOOD PRESSURE   TOLNAFTATE (TINACTIN) 1 % CREAM    Apply topically 2 (two) times daily. To rash  Modified Medications   No medications on file  Discontinued Medications   No medications on file     Physical Exam: Physical Exam  Constitutional: She is oriented to person, place, and time. She appears well-developed and well-nourished. No distress.  HENT:  Head: Normocephalic and atraumatic.  Eyes: EOM are normal. Pupils are equal, round, and reactive to light. Right eye exhibits no discharge.  Neck: Normal range of motion. Neck supple. No JVD present. No thyromegaly present.  Cardiovascular: Normal rate, regular rhythm  and normal heart sounds.   No murmur heard. Pulmonary/Chest: Effort normal and breath sounds normal. She has no wheezes. She has no rales.  Abdominal: Soft. Bowel sounds are normal. She exhibits no distension. There is no tenderness.  Musculoskeletal: Normal range of motion. She exhibits tenderness (right SIJ, hip, knee tenderness, resolved. ). She exhibits no edema.  Lymphadenopathy:    She has no cervical adenopathy.  Neurological: She is alert and oriented to person, place, and time. She has normal reflexes. No cranial nerve deficit. She exhibits normal muscle tone. Coordination normal.  Skin: Skin is warm and dry. No rash noted. She is not diaphoretic. No erythema.  Psychiatric: She has a normal mood and affect. Her behavior is normal. Judgment and thought content normal.    Filed Vitals:  12/17/13 1336  BP: 144/70  Pulse: 72  Weight: 147 lb (66.679 kg)      Labs reviewed: Basic Metabolic Panel:  Recent Labs  06/18/13  NA 140  K 4.4  BUN 21  CREATININE 1.2*   CBC:  Recent Labs  06/18/13  HGB 13.3  HCT 39    Past Procedures:  01/22/13 X-ray R knee showed mild degenerative changes. R pelvic and bilateral hips: advanced degenerative disc ?facet.   Assessment/Plan HTN (hypertension) Controlled on Nifedipine 60mg   and Labetalol 100mg  and Telmisartan 80mg       Insomnia Stopped taking Temazepam--then Ativan 0.5mg  bid then 0.25mg  qod-successful dose reduction.     Generalized anxiety disorder Able to reduce Ativan form 0.5mg  bid to 0.25mg  qod.    Unspecified constipation Stable. Takes Psyllium tid.   Peripheral neuropathy Stated she had nerve conduction study. Has off and on prickling and burning sensation, spasm, shooting pain. [facility] referral. Didn't desire any pain management.     Family/ Staff Communication: observe the patient.   Goals of Care: [place]  Labs/tests ordered: update labs.

## 2013-12-20 DIAGNOSIS — G629 Polyneuropathy, unspecified: Secondary | ICD-10-CM | POA: Insufficient documentation

## 2013-12-20 DIAGNOSIS — K5900 Constipation, unspecified: Secondary | ICD-10-CM | POA: Insufficient documentation

## 2013-12-20 NOTE — Assessment & Plan Note (Signed)
Controlled on Nifedipine 60mg   and Labetalol 100mg  and Telmisartan 80mg 

## 2013-12-20 NOTE — Assessment & Plan Note (Signed)
Stable. Takes Psyllium tid.

## 2013-12-20 NOTE — Assessment & Plan Note (Signed)
Stated she had nerve conduction study. Has off and on prickling and burning sensation, spasm, shooting pain. [facility] referral. Didn't desire any pain management.

## 2013-12-20 NOTE — Assessment & Plan Note (Signed)
Able to reduce Ativan form 0.5mg  bid to 0.25mg  qod.

## 2013-12-20 NOTE — Assessment & Plan Note (Signed)
Stopped taking Temazepam--then Ativan 0.5mg  bid then 0.25mg  qod-successful dose reduction.

## 2013-12-28 ENCOUNTER — Other Ambulatory Visit: Payer: Self-pay | Admitting: Nurse Practitioner

## 2014-01-05 DIAGNOSIS — H353190 Nonexudative age-related macular degeneration, unspecified eye, stage unspecified: Secondary | ICD-10-CM | POA: Diagnosis not present

## 2014-01-05 DIAGNOSIS — H353290 Exudative age-related macular degeneration, unspecified eye, stage unspecified: Secondary | ICD-10-CM | POA: Diagnosis not present

## 2014-01-05 DIAGNOSIS — H40019 Open angle with borderline findings, low risk, unspecified eye: Secondary | ICD-10-CM | POA: Diagnosis not present

## 2014-01-05 DIAGNOSIS — Z961 Presence of intraocular lens: Secondary | ICD-10-CM | POA: Diagnosis not present

## 2014-01-06 DIAGNOSIS — H353290 Exudative age-related macular degeneration, unspecified eye, stage unspecified: Secondary | ICD-10-CM | POA: Diagnosis not present

## 2014-01-06 DIAGNOSIS — H35359 Cystoid macular degeneration, unspecified eye: Secondary | ICD-10-CM | POA: Diagnosis not present

## 2014-01-06 DIAGNOSIS — H35059 Retinal neovascularization, unspecified, unspecified eye: Secondary | ICD-10-CM | POA: Diagnosis not present

## 2014-01-07 ENCOUNTER — Other Ambulatory Visit: Payer: Self-pay | Admitting: Internal Medicine

## 2014-01-27 ENCOUNTER — Other Ambulatory Visit: Payer: Self-pay | Admitting: Internal Medicine

## 2014-01-29 ENCOUNTER — Telehealth: Payer: Self-pay

## 2014-01-29 NOTE — Telephone Encounter (Signed)
Patient came by [facility] clinic and left note that she wanted referred to Neurology Specialist possibly Dr. Jannifer Franklin. I reviewed [facility] note from 12/17/13 she had recommend referral, but at the time she wanted to think about and ready for the referral. Explained that I will fax records and the doctor has to review before they will make the appt. It could take 1-2 weeks before they will call back. Patient understands this.

## 2014-02-01 ENCOUNTER — Other Ambulatory Visit: Payer: Self-pay | Admitting: Nurse Practitioner

## 2014-02-01 ENCOUNTER — Other Ambulatory Visit: Payer: Self-pay | Admitting: Internal Medicine

## 2014-02-10 DIAGNOSIS — H35059 Retinal neovascularization, unspecified, unspecified eye: Secondary | ICD-10-CM | POA: Diagnosis not present

## 2014-02-10 DIAGNOSIS — H353290 Exudative age-related macular degeneration, unspecified eye, stage unspecified: Secondary | ICD-10-CM | POA: Diagnosis not present

## 2014-02-16 ENCOUNTER — Encounter: Payer: Self-pay | Admitting: Neurology

## 2014-02-16 ENCOUNTER — Encounter (INDEPENDENT_AMBULATORY_CARE_PROVIDER_SITE_OTHER): Payer: Self-pay

## 2014-02-16 ENCOUNTER — Ambulatory Visit (INDEPENDENT_AMBULATORY_CARE_PROVIDER_SITE_OTHER): Payer: Medicare Other | Admitting: Neurology

## 2014-02-16 VITALS — BP 157/76 | HR 69 | Ht 65.5 in | Wt 148.0 lb

## 2014-02-16 DIAGNOSIS — G2581 Restless legs syndrome: Secondary | ICD-10-CM

## 2014-02-16 DIAGNOSIS — R269 Unspecified abnormalities of gait and mobility: Secondary | ICD-10-CM

## 2014-02-16 DIAGNOSIS — D518 Other vitamin B12 deficiency anemias: Secondary | ICD-10-CM

## 2014-02-16 DIAGNOSIS — G63 Polyneuropathy in diseases classified elsewhere: Secondary | ICD-10-CM

## 2014-02-16 HISTORY — DX: Polyneuropathy in diseases classified elsewhere: G63

## 2014-02-16 MED ORDER — GABAPENTIN 100 MG PO CAPS: 100.0000 mg | ORAL_CAPSULE | Freq: Three times a day (TID) | ORAL | Status: DC

## 2014-02-16 NOTE — Progress Notes (Signed)
Reason for visit: Peripheral neuropathy  Jacqueline Dodson is a 80 y.o. female  History of present illness:  Jacqueline Dodson is an 80 year old right-handed white female with a history of a gait disorder that dates back 15 years or more. The patient in retrospect also noted that she has had some restless leg syndrome symptoms for greater than 30 years. She reports some tingling sensations in the feet and legs that may bother her at nighttime. She indicates that she has been on 15 mg of Restoril for quite some time, and she tried to come off of this medication recently. She indicated that the restless legs syndrome symptoms worsened during the day, and she has had some tingling sensations in the feet at night that may keep her from sleeping. She has gone back on the Restoril, and the restless legs symptoms have improved, and she is sleeping better, but she still has symptoms at night. She occasionally will have a shock sensation in the leg. She indicates that she has several brothers with an ataxia syndrome and a severe gait disorder. Five years ago, she had EMG and nerve conduction studies that confirmed the presence of a peripheral neuropathy with nerve conductions in the mid to upper 30s. The sensory latencies were absent in the legs. She currently uses a walker for ambulation, and she has been using a walker for 2 years. She denies any recent falls. She does report some difficulty with urinary incontinence, no problems controlling the bowels. She is sent to this office for further evaluation.  Past Medical History  Diagnosis Date  . Dermatophytosis of the body   . Anxiety   . Restless legs syndrome (RLS)   . Primary cerebellar degeneration   . Unspecified hereditary and idiopathic peripheral neuropathy   . Macular degeneration (senile) of retina, unspecified   . Hypertension   . Left bundle branch hemiblock   . Acute, but ill-defined, cerebrovascular disease   . Hemorrhoids   . Orthostatic hypotension     . GERD (gastroesophageal reflux disease)   . Duodenal ulcer, unspecified as acute or chronic, without hemorrhage, perforation, or obstruction   . Unspecified constipation   . Chronic kidney disease, stage II (mild)   . Cystocele, midline   . Rectocele   . Postmenopausal atrophic vaginitis   . Lumbago   . Myalgia and myositis, unspecified   . Dizziness and giddiness   . Other malaise and fatigue   . Abnormality of gait   . Disturbance of skin sensation   . Edema   . Urgency of urination   . Other abnormal blood chemistry   . Elevated blood pressure reading without diagnosis of hypertension   . Personal history of fall   . Arthritis   . Polyneuropathy in other diseases classified elsewhere 02/16/2014    Past Surgical History  Procedure Laterality Date  . Tonsillectomy  1940  . Appendectomy  1945  . External ear surgery  2010    left outer ear basel cell removed  . Abdominal hysterectomy  1972  . Cataract extraction Bilateral     Family History  Problem Relation Age of Onset  . Aortic stenosis Mother   . Heart failure Mother   . Thrombosis Father   . Coronary artery disease Father   . Ataxia Sister   . Ataxia Brother   . Heart disease Brother   . Cancer Brother     Prostate  . Stroke Brother     Social history:  reports  that she has never smoked. She has never used smokeless tobacco. She reports that she does not drink alcohol or use illicit drugs.  Medications:  Current Outpatient Prescriptions on File Prior to Visit  Medication Sig Dispense Refill  . labetalol (NORMODYNE) 100 MG tablet TAKE 1 TABLET TWICE A DAY TO CONTROL BLOOD PRESSURE  60 tablet  4  . LORazepam (ATIVAN) 0.5 MG tablet TAKE 1 TABLET BY MOUTH EVERY 12 HOURS  60 tablet  2  . Multiple Vitamins-Minerals (ICAPS) CAPS Take by mouth. Take one daily      . Multiple Vitamins-Minerals (MULTIVITAMIN WITH MINERALS) tablet Take 1 tablet by mouth daily.      Marland Kitchen NIFEdipine (PROCARDIA-XL/ADALAT CC) 60 MG 24 hr  tablet TAKE 1 TABLET DAILY FOR BLOOD PRESSURE  60 tablet  5  . psyllium (METAMUCIL) 58.6 % powder Take 1 packet by mouth 3 (three) times daily.      . Ranibizumab (LUCENTIS) 0.3 MG/0.05ML SOLN Inject into the eye.      . telmisartan (MICARDIS) 80 MG tablet TAKE 1 TABLET BY MOUTH EVERY DAY FOR BLOOD PRESSURE  30 tablet  3  . temazepam (RESTORIL) 15 MG capsule TAKE ONE CAPSULE BY MOUTH EVERY EVENING AS NEEDED FOR REST  30 capsule  5  . tolnaftate (TINACTIN) 1 % cream Apply topically 2 (two) times daily. To rash       No current facility-administered medications on file prior to visit.      Allergies  Allergen Reactions  . Amlodipine   . Amoxicillin   . Enalapril   . Hctz [Hydrochlorothiazide]   . Penicillins     ROS:  Out of a complete 14 system review of symptoms, the patient complains only of the following symptoms, and all other reviewed systems are negative.  Fatigue Rash Loss of vision Incontinence of the bladder Muscle cramps Runny nose, skin sensitivity Tremor Decreased energy Insomnia, restless legs  Blood pressure 157/76, pulse 69, height 5' 5.5" (1.664 m), weight 148 lb (67.132 kg).  Physical Exam  General: The patient is alert and cooperative at the time of the examination.  Eyes: Pupils are equal, round, and reactive to light. Discs are flat bilaterally.  Neck: The neck is supple, no carotid bruits are noted.  Respiratory: The respiratory examination is clear.  Cardiovascular: The cardiovascular examination reveals a regular rate and rhythm, no obvious murmurs or rubs are noted.  Skin: Extremities are without significant edema.  Neurologic Exam  Mental status: The patient is alert and oriented x 3 at the time of the examination. The patient has apparent normal recent and remote memory, with an apparently normal attention span and concentration ability.  Cranial nerves: Facial symmetry is present. There is good sensation of the face to pinprick and soft  touch bilaterally. The strength of the facial muscles and the muscles to head turning and shoulder shrug are normal bilaterally. Speech is well enunciated, no aphasia or dysarthria is noted. Extraocular movements are full. Visual fields are full. The tongue is midline, and the patient has symmetric elevation of the soft palate. No obvious hearing deficits are noted.  Motor: The motor testing reveals 5 over 5 strength of all 4 extremities. Good symmetric motor tone is noted throughout.  Sensory: Sensory testing is intact to pinprick, soft touch, vibration sensation, and position sense on the upper extremities. With the lower extremities, the patient has a stocking pattern pinprick sensory deficit to just below the knees bilaterally, with significant impairment of vibration and position  sense in both feet. No evidence of extinction is noted.  Coordination: Cerebellar testing reveals good finger-nose-finger and heel-to-shin bilaterally.  Gait and station: Gait is wide-based, the patient uses a walker for ambulation. Tandem gait was not attempted. Romberg is negative. No drift is seen.  Reflexes: Deep tendon reflexes are symmetric, but are depressed bilaterally. Reflexes are essentially absent in the legs. Toes are downgoing bilaterally.   Assessment/Plan:  One. Peripheral neuropathy  2. Restless leg syndrome  3. Gait disorder  The patient may have an inherited neuropathy, possibly a Charcot-Marie-Tooth disease. The patient is under the impression that her brothers had a cerebellar ataxia syndrome. The patient does not appear to have features of cerebellar ataxia on clinical examination. Further blood work will be done today, and she will be placed on low-dose gabapentin. She will try to come off of the Restoril, and we may need to readjust the dose of gabapentin to accommodate her restless legs symptoms and to help her sleep. She otherwise will followup in 4 months.  Jill Alexanders MD 02/16/2014  12:33 PM  [facility] [address] [place], [place] [address]  Phone [phone] Fax [phone]

## 2014-02-16 NOTE — Patient Instructions (Signed)

## 2014-03-17 DIAGNOSIS — H353290 Exudative age-related macular degeneration, unspecified eye, stage unspecified: Secondary | ICD-10-CM | POA: Diagnosis not present

## 2014-03-17 DIAGNOSIS — H35359 Cystoid macular degeneration, unspecified eye: Secondary | ICD-10-CM | POA: Diagnosis not present

## 2014-03-29 ENCOUNTER — Other Ambulatory Visit: Payer: Self-pay | Admitting: Nurse Practitioner

## 2014-05-04 DIAGNOSIS — H35059 Retinal neovascularization, unspecified, unspecified eye: Secondary | ICD-10-CM | POA: Diagnosis not present

## 2014-05-04 DIAGNOSIS — H353290 Exudative age-related macular degeneration, unspecified eye, stage unspecified: Secondary | ICD-10-CM | POA: Diagnosis not present

## 2014-06-09 DIAGNOSIS — H35359 Cystoid macular degeneration, unspecified eye: Secondary | ICD-10-CM | POA: Diagnosis not present

## 2014-06-09 DIAGNOSIS — H35059 Retinal neovascularization, unspecified, unspecified eye: Secondary | ICD-10-CM | POA: Diagnosis not present

## 2014-06-09 DIAGNOSIS — H353290 Exudative age-related macular degeneration, unspecified eye, stage unspecified: Secondary | ICD-10-CM | POA: Diagnosis not present

## 2014-06-10 ENCOUNTER — Other Ambulatory Visit: Payer: Self-pay | Admitting: Neurology

## 2014-06-10 ENCOUNTER — Other Ambulatory Visit: Payer: Self-pay | Admitting: Internal Medicine

## 2014-06-17 DIAGNOSIS — R269 Unspecified abnormalities of gait and mobility: Secondary | ICD-10-CM | POA: Diagnosis not present

## 2014-06-17 DIAGNOSIS — D509 Iron deficiency anemia, unspecified: Secondary | ICD-10-CM | POA: Diagnosis not present

## 2014-06-17 DIAGNOSIS — Z8639 Personal history of other endocrine, nutritional and metabolic disease: Secondary | ICD-10-CM | POA: Diagnosis not present

## 2014-06-17 DIAGNOSIS — G63 Polyneuropathy in diseases classified elsewhere: Secondary | ICD-10-CM | POA: Diagnosis not present

## 2014-06-17 DIAGNOSIS — G2581 Restless legs syndrome: Secondary | ICD-10-CM | POA: Diagnosis not present

## 2014-06-21 ENCOUNTER — Telehealth: Payer: Self-pay | Admitting: Neurology

## 2014-06-21 NOTE — Telephone Encounter (Signed)
The blood work that was ordered in may of 2015 was finally done. The angiotensin-converting enzyme level was 45, normal. The vitamin B12 level was 520, normal. Rheumatoid factor was negative, and immunoelectrophoresis was done, full results are pending. The serum copper level was normal. The ANA was negative.

## 2014-06-23 ENCOUNTER — Encounter (INDEPENDENT_AMBULATORY_CARE_PROVIDER_SITE_OTHER): Payer: Self-pay

## 2014-06-23 ENCOUNTER — Encounter: Payer: Self-pay | Admitting: Adult Health

## 2014-06-23 ENCOUNTER — Ambulatory Visit (INDEPENDENT_AMBULATORY_CARE_PROVIDER_SITE_OTHER): Payer: Medicare Other | Admitting: Adult Health

## 2014-06-23 VITALS — BP 154/75 | HR 70 | Ht 65.5 in | Wt 154.0 lb

## 2014-06-23 DIAGNOSIS — G63 Polyneuropathy in diseases classified elsewhere: Secondary | ICD-10-CM | POA: Diagnosis not present

## 2014-06-23 DIAGNOSIS — G2581 Restless legs syndrome: Secondary | ICD-10-CM | POA: Diagnosis not present

## 2014-06-23 MED ORDER — GABAPENTIN 100 MG PO CAPS: ORAL_CAPSULE | ORAL | Status: DC

## 2014-06-23 NOTE — Progress Notes (Signed)
PATIENT: Jacqueline Dodson DOB: 1926-10-27  REASON FOR VISIT: follow up HISTORY FROM: patient  HISTORY OF PRESENT ILLNESS: Jacqueline Dodson is an 80 year old female with a history of abnormality of gait. She returns today for follow-up. She has had an EMG in the past that confirmed peripheral neuropathy. She also has RLS and was taking restoril. She was started on gabapentin 100 mg TID at the last visit. She states that during the day her symptoms are controlled but at night she has increased discomfort and that causes her to not  sleep well. She occasionally will have electric like sensations in the legs during the day. She has some numbness in the feet that she notices more at night. She describes this as feeling like her feet are wrapped really tight.  She reports no change in her walking. She uses a rollator when ambulating. Denies any recent falls.   HISTORY 02/16/14 (CW): 80 year old right-handed white female with a history of a gait disorder that dates back 15 years or more. The patient in retrospect also noted that she has had some restless leg syndrome symptoms for greater than 30 years. She reports some tingling sensations in the feet and legs that may bother her at nighttime. She indicates that she has been on 15 mg of Restoril for quite some time, and she tried to come off of this medication recently. She indicated that the restless legs syndrome symptoms worsened during the day, and she has had some tingling sensations in the feet at night that may keep her from sleeping. She has gone back on the Restoril, and the restless legs symptoms have improved, and she is sleeping better, but she still has symptoms at night. She occasionally will have a shock sensation in the leg. She indicates that she has several brothers with an ataxia syndrome and a severe gait disorder. Five years ago, she had EMG and nerve conduction studies that confirmed the presence of a peripheral neuropathy with nerve conductions in the  mid to upper 30s. The sensory latencies were absent in the legs. She currently uses a walker for ambulation, and she has been using a walker for 2 years. She denies any recent falls. She does report some difficulty with urinary incontinence, no problems controlling the bowels. She is sent to this office for further evaluation.  REVIEW OF SYSTEMS: Full 14 system review of systems performed and notable only for:  Constitutional: Fatigue Eyes: Loss of vision Ear/Nose/Throat: N/A  Skin: N/A  Cardiovascular: N/A  Respiratory: N/A  Gastrointestinal: N/A  Genitourinary: N/A Hematology/Lymphatic: N/A  Endocrine: N/A Musculoskeletal:N/A  Allergy/Immunology: N/A  Neurological: Numbness, tremors Psychiatric: N/A Sleep: Restless leg, insomnia, daytime sleepiness   ALLERGIES: Allergies  Allergen Reactions  . Amlodipine   . Amoxicillin   . Enalapril   . Hctz [Hydrochlorothiazide]   . Penicillins     HOME MEDICATIONS: Outpatient Prescriptions Prior to Visit  Medication Sig Dispense Refill  . gabapentin (NEURONTIN) 100 MG capsule TAKE 1 CAPSULE (100 MG TOTAL) BY MOUTH 3 (THREE) TIMES DAILY.  90 capsule  0  . labetalol (NORMODYNE) 100 MG tablet TAKE 1 TABLET TWICE A DAY TO CONTROL BLOOD PRESSURE  60 tablet  4  . LORazepam (ATIVAN) 0.5 MG tablet TAKE 1 TABLET BY MOUTH EVERY 12 HOURS  60 tablet  2  . Multiple Vitamins-Minerals (ICAPS) CAPS Take by mouth. Take one daily      . Multiple Vitamins-Minerals (MULTIVITAMIN WITH MINERALS) tablet Take 1 tablet by mouth daily.      Marland Kitchen  NIFEdipine (PROCARDIA-XL/ADALAT CC) 60 MG 24 hr tablet TAKE 1 TABLET DAILY FOR BLOOD PRESSURE  60 tablet  5  . psyllium (METAMUCIL) 58.6 % powder Take 1 packet by mouth 3 (three) times daily.      . Ranibizumab (LUCENTIS) 0.3 MG/0.05ML SOLN Inject into the eye.      . telmisartan (MICARDIS) 80 MG tablet TAKE 1 TABLET BY MOUTH EVERY DAY FOR BLOOD PRESSURE  30 tablet  3  . tolnaftate (TINACTIN) 1 % cream Apply topically 2  (two) times daily. To rash      . temazepam (RESTORIL) 15 MG capsule TAKE ONE CAPSULE BY MOUTH EVERY EVENING AS NEEDED FOR REST  30 capsule  5   No facility-administered medications prior to visit.    PAST MEDICAL HISTORY: Past Medical History  Diagnosis Date  . Dermatophytosis of the body   . Anxiety   . Restless legs syndrome (RLS)   . Primary cerebellar degeneration   . Unspecified hereditary and idiopathic peripheral neuropathy   . Macular degeneration (senile) of retina, unspecified   . Hypertension   . Left bundle branch hemiblock   . Acute, but ill-defined, cerebrovascular disease   . Hemorrhoids   . Orthostatic hypotension   . GERD (gastroesophageal reflux disease)   . Duodenal ulcer, unspecified as acute or chronic, without hemorrhage, perforation, or obstruction   . Unspecified constipation   . Chronic kidney disease, stage II (mild)   . Cystocele, midline   . Rectocele   . Postmenopausal atrophic vaginitis   . Lumbago   . Myalgia and myositis, unspecified   . Dizziness and giddiness   . Other malaise and fatigue   . Abnormality of gait   . Disturbance of skin sensation   . Edema   . Urgency of urination   . Other abnormal blood chemistry   . Elevated blood pressure reading without diagnosis of hypertension   . Personal history of fall   . Arthritis   . Polyneuropathy in other diseases classified elsewhere 02/16/2014    PAST SURGICAL HISTORY: Past Surgical History  Procedure Laterality Date  . Tonsillectomy  1940  . Appendectomy  1945  . External ear surgery  2010    left outer ear basel cell removed  . Abdominal hysterectomy  1972  . Cataract extraction Bilateral     FAMILY HISTORY: Family History  Problem Relation Age of Onset  . Aortic stenosis Mother   . Heart failure Mother   . Thrombosis Father   . Coronary artery disease Father   . Ataxia Sister   . Ataxia Brother   . Heart disease Brother   . Cancer Brother     Prostate  . Stroke  Brother     SOCIAL HISTORY: History   Social History  . Marital Status: Widowed    Spouse Name: N/A    Number of Children: 74  . Years of Education: college   Occupational History  . Retired    Social History Main Topics  . Smoking status: Never Smoker   . Smokeless tobacco: Never Used  . Alcohol Use: No  . Drug Use: No  . Sexual Activity: No   Other Topics Concern  . Not on file   Social History Narrative   Patient is widowed, with 5 children.   Patient is right handed.   Patient has college education.   Patient does not drink caffeine.      PHYSICAL EXAM  Filed Vitals:   06/23/14 1000  BP: 154/75  Pulse: 70  Height: 5' 5.5" (1.664 m)  Weight: 154 lb (69.854 kg)   Body mass index is 25.23 kg/(m^2).  Generalized: Well developed, in no acute distress   Neurological examination  Mentation: Alert oriented to time, place, history taking. Follows all commands speech and language fluent Cranial nerve II-XII: Pupils were equal round reactive to light. Extraocular movements were full, visual field were full on confrontational test. Facial sensation and strength were normal. Uvula tongue midline. Head turning and shoulder shrug  were normal and symmetric. Motor: The motor testing reveals 5 over 5 strength of all 4 extremities. Good symmetric motor tone is noted throughout.  Sensory: Sensory testing is intact to soft touch on all 4 extremities. No evidence of extinction is noted.  Coordination: Cerebellar testing reveals good finger-nose-finger and heel-to-shin bilaterally.  Gait and station: Gait is normal, uses a rolator when walking. Tandem gait is slightly unsteady. Romberg is negative. No drift is seen.  Reflexes: Deep tendon reflexes are symmetric but depressed throughout.   DIAGNOSTIC DATA (LABS, IMAGING, TESTING) - I reviewed patient records, labs, notes, testing and imaging myself where available.  Lab Results  Component Value Date   WBC 5.5 07/15/2012    HGB 13.3 06/18/2013   HCT 39 06/18/2013   MCV 87.1 07/23/2010   PLT 213 07/15/2012      Component Value Date/Time   NA 140 06/18/2013   NA 133* 07/25/2010 0505   K 4.4 06/18/2013   CL 106 07/25/2010 0505   CO2 23 07/25/2010 0505   GLUCOSE 107* 07/25/2010 0505   BUN 21 06/18/2013   BUN 13 07/25/2010 0505   CREATININE 1.2* 06/18/2013   CREATININE 1.11 07/25/2010 0505   CALCIUM 8.0* 07/25/2010 0505   GFRNONAA 47* 07/25/2010 0505   GFRAA  Value: 57        The eGFR has been calculated using the MDRD equation. This calculation has not been validated in all clinical situations. eGFR's persistently <60 mL/min signify possible Chronic Kidney Disease.* 07/25/2010 0505    Lab Results  Component Value Date   TSH 2.17 07/15/2012      ASSESSMENT AND PLAN 80 y.o. year old female  has a past medical history of Dermatophytosis of the body; Anxiety; Restless legs syndrome (RLS); Primary cerebellar degeneration; Unspecified hereditary and idiopathic peripheral neuropathy; Macular degeneration (senile) of retina, unspecified; Hypertension; Left bundle branch hemiblock; Acute, but ill-defined, cerebrovascular disease; Hemorrhoids; Orthostatic hypotension; GERD (gastroesophageal reflux disease); Duodenal ulcer, unspecified as acute or chronic, without hemorrhage, perforation, or obstruction; Unspecified constipation; Chronic kidney disease, stage II (mild); Cystocele, midline; Rectocele; Postmenopausal atrophic vaginitis; Lumbago; Myalgia and myositis, unspecified; Dizziness and giddiness; Other malaise and fatigue; Abnormality of gait; Disturbance of skin sensation; Edema; Urgency of urination; Other abnormal blood chemistry; Elevated blood pressure reading without diagnosis of hypertension; Personal history of fall; Arthritis; and Polyneuropathy in other diseases classified elsewhere (02/16/2014). here with:  1. Polyneuropathy 2. Restless leg syndrome  I will increase her gabapentin at night. She will increase  to 2 tablets at night. Continue taking 1 tablet in the morning and at noon. Patient is very hesitant to increase her medications due to potential side effects. She was advised to let us know if her symptoms worsen or she develops new symptoms. Some of the lab work ordered by Dr. Jannifer Franklin is still pending. Patient will follow-up in 4-5 months or sooner if needed.    Ward Givens, MSN, NP-C 06/23/2014, 10:04 AM [facility] [address], [place],  [place] [address] [phone]  Note: This document was prepared with digital dictation and possible smart phrase technology. Any transcriptional errors that result from this process are unintentional.

## 2014-06-23 NOTE — Patient Instructions (Signed)
Gabapentin capsules or tablets What is this medicine? GABAPENTIN (GA ba pen tin) is used to control partial seizures in adults with epilepsy. It is also used to treat certain types of nerve pain. This medicine may be used for other purposes; ask your health care provider or pharmacist if you have questions. COMMON BRAND NAME(S): Gabarone, Neurontin What should I tell my health care provider before I take this medicine? They need to know if you have any of these conditions: -kidney disease -suicidal thoughts, plans, or attempt; a previous suicide attempt by you or a family member -an unusual or allergic reaction to gabapentin, other medicines, foods, dyes, or preservatives -pregnant or trying to get pregnant -breast-feeding How should I use this medicine? Take this medicine by mouth with a glass of water. Follow the directions on the prescription label. You can take it with or without food. If it upsets your stomach, take it with food.Take your medicine at regular intervals. Do not take it more often than directed. Do not stop taking except on your doctor's advice. If you are directed to break the 600 or 800 mg tablets in half as part of your dose, the extra half tablet should be used for the next dose. If you have not used the extra half tablet within 28 days, it should be thrown away. A special MedGuide will be given to you by the pharmacist with each prescription and refill. Be sure to read this information carefully each time. Talk to your pediatrician regarding the use of this medicine in children. Special care may be needed. Overdosage: If you think you have taken too much of this medicine contact a poison control center or emergency room at once. NOTE: This medicine is only for you. Do not share this medicine with others. What if I miss a dose? If you miss a dose, take it as soon as you can. If it is almost time for your next dose, take only that dose. Do not take double or extra  doses. What may interact with this medicine? Do not take this medicine with any of the following medications: -other gabapentin products This medicine may also interact with the following medications: -alcohol -antacids -antihistamines for allergy, cough and cold -certain medicines for anxiety or sleep -certain medicines for depression or psychotic disturbances -homatropine; hydrocodone -naproxen -narcotic medicines (opiates) for pain -phenothiazines like chlorpromazine, mesoridazine, prochlorperazine, thioridazine This list may not describe all possible interactions. Give your health care provider a list of all the medicines, herbs, non-prescription drugs, or dietary supplements you use. Also tell them if you smoke, drink alcohol, or use illegal drugs. Some items may interact with your medicine. What should I watch for while using this medicine? Visit your doctor or health care professional for regular checks on your progress. You may want to keep a record at home of how you feel your condition is responding to treatment. You may want to share this information with your doctor or health care professional at each visit. You should contact your doctor or health care professional if your seizures get worse or if you have any new types of seizures. Do not stop taking this medicine or any of your seizure medicines unless instructed by your doctor or health care professional. Stopping your medicine suddenly can increase your seizures or their severity. Wear a medical identification bracelet or chain if you are taking this medicine for seizures, and carry a card that lists all your medications. You may get drowsy, dizzy, or have blurred   vision. Do not drive, use machinery, or do anything that needs mental alertness until you know how this medicine affects you. To reduce dizzy or fainting spells, do not sit or stand up quickly, especially if you are an older patient. Alcohol can increase drowsiness and  dizziness. Avoid alcoholic drinks. Your mouth may get dry. Chewing sugarless gum or sucking hard candy, and drinking plenty of water will help. The use of this medicine may increase the chance of suicidal thoughts or actions. Pay special attention to how you are responding while on this medicine. Any worsening of mood, or thoughts of suicide or dying should be reported to your health care professional right away. Women who become pregnant while using this medicine may enroll in the [place] Antiepileptic Drug Pregnancy Registry by calling 1-888-233-2334. This registry collects information about the safety of antiepileptic drug use during pregnancy. What side effects may I notice from receiving this medicine? Side effects that you should report to your doctor or health care professional as soon as possible: -allergic reactions like skin rash, itching or hives, swelling of the face, lips, or tongue -worsening of mood, thoughts or actions of suicide or dying Side effects that usually do not require medical attention (report to your doctor or health care professional if they continue or are bothersome): -constipation -difficulty walking or controlling muscle movements -dizziness -nausea -slurred speech -tiredness -tremors -weight gain This list may not describe all possible side effects. Call your doctor for medical advice about side effects. You may report side effects to FDA at 1-800-FDA-1088. Where should I keep my medicine? Keep out of reach of children. Store at room temperature between 15 and 30 degrees C (59 and 86 degrees F). Throw away any unused medicine after the expiration date. NOTE: This sheet is a summary. It may not cover all possible information. If you have questions about this medicine, talk to your doctor, pharmacist, or health care provider.  2015, [employer]vier/Gold Standard. (2013-05-28 09:12:48)  

## 2014-06-24 ENCOUNTER — Encounter: Payer: Self-pay | Admitting: Internal Medicine

## 2014-06-24 ENCOUNTER — Non-Acute Institutional Stay: Payer: Medicare Other | Admitting: Internal Medicine

## 2014-06-24 VITALS — BP 142/78 | HR 60 | Wt 152.0 lb

## 2014-06-24 DIAGNOSIS — G63 Polyneuropathy in diseases classified elsewhere: Secondary | ICD-10-CM | POA: Diagnosis not present

## 2014-06-24 DIAGNOSIS — I10 Essential (primary) hypertension: Secondary | ICD-10-CM

## 2014-06-24 DIAGNOSIS — G2581 Restless legs syndrome: Secondary | ICD-10-CM

## 2014-06-24 DIAGNOSIS — F411 Generalized anxiety disorder: Secondary | ICD-10-CM

## 2014-06-24 DIAGNOSIS — G4700 Insomnia, unspecified: Secondary | ICD-10-CM

## 2014-06-24 DIAGNOSIS — R269 Unspecified abnormalities of gait and mobility: Secondary | ICD-10-CM

## 2014-06-24 NOTE — Progress Notes (Signed)
I have read the note, and I agree with the clinical assessment and plan.  Awesome Jared KEITH   

## 2014-06-24 NOTE — Progress Notes (Signed)
Patient ID: Jacqueline Dodson, female   DOB: 22-Oct-1926, 80 y.o.   MRN: [id number]    Location:  [facility]   Place of Service: Clinic (12)    Allergies  Allergen Reactions  . Amlodipine   . Amoxicillin   . Enalapril   . Hctz [Hydrochlorothiazide]   . Penicillins     Chief Complaint  Patient presents with  . Medical Management of Chronic Issues    blood pressure, insomnia, anxiety    HPI:  Essential hypertension: very mild elevation in the SBP at home and today  Insomnia: improved  Generalized anxiety disorder: improved  Polyneuropathy in other diseases classified elsewhere: seen by Dr., Jannifer Franklin 06/21/14. Improved on gabapentin during the day. He increased the night dose to 2 tablets.  Restless legs syndrome (RLS): improved since on gabapentin  Abnormality of gait: using 4 wheel walker with seat and brakes.    Medications: Patient's Medications  New Prescriptions   No medications on file  Previous Medications   LABETALOL (NORMODYNE) 100 MG TABLET    TAKE 1 TABLET TWICE A DAY TO CONTROL BLOOD PRESSURE   LORAZEPAM (ATIVAN) 0.5 MG TABLET    TAKE 1 TABLET BY MOUTH EVERY 12 HOURS   MULTIPLE VITAMINS-MINERALS (ICAPS) CAPS    Take by mouth. Take one daily   MULTIPLE VITAMINS-MINERALS (MULTIVITAMIN WITH MINERALS) TABLET    Take 1 tablet by mouth daily.   NIFEDIPINE (PROCARDIA-XL/ADALAT CC) 60 MG 24 HR TABLET    TAKE 1 TABLET DAILY FOR BLOOD PRESSURE   PSYLLIUM (METAMUCIL) 58.6 % POWDER    Take 1 packet by mouth 3 (three) times daily.   RANIBIZUMAB (LUCENTIS) 0.3 MG/0.05ML SOLN    Inject into the eye.   TELMISARTAN (MICARDIS) 80 MG TABLET    TAKE 1 TABLET BY MOUTH EVERY DAY FOR BLOOD PRESSURE   TOLNAFTATE (TINACTIN) 1 % CREAM    Apply topically 2 (two) times daily. To rash  Modified Medications   Modified Medication Previous Medication   GABAPENTIN (NEURONTIN) 100 MG CAPSULE gabapentin (NEURONTIN) 100 MG capsule      Take 1 tablet by mouth at breakfast, 1 tablet at  2:00,  and 2 tablets at bedtime.    Take 1 tablet by mouth at breakfast and lunch and 2 tablets at bedtime.  Discontinued Medications   No medications on file     Review of Systems  Constitutional: Negative for fever, chills, diaphoresis, activity change, appetite change and fatigue.  HENT: Negative for congestion, drooling, hearing loss, sinus pressure, tinnitus, trouble swallowing and voice change.         Right ear feels "funny" at times  Eyes: Positive for visual disturbance (corrective lenses).  Respiratory: Negative for cough, choking, chest tightness and shortness of breath.   Cardiovascular: Positive for leg swelling. Negative for chest pain and palpitations.  Gastrointestinal: Positive for constipation. Negative for nausea, abdominal pain, diarrhea and abdominal distention.       Indigestion at times. Seems more frequent since on gabapentin.  Endocrine: Negative.   Genitourinary: Negative.   Musculoskeletal: Positive for gait problem.  Skin: Negative.   Allergic/Immunologic: Negative.   Neurological:       Polyneuropathy  Hematological: Negative.   Psychiatric/Behavioral: Negative.     Filed Vitals:   06/24/14 1351  BP: 142/78  Pulse: 60  Weight: 152 lb (68.947 kg)   Body mass index is 24.9 kg/(m^2).  Physical Exam  Constitutional: She is oriented to person, place, and time. She appears well-developed and well-nourished.  No distress.  HENT:  Head: Normocephalic and atraumatic.  Eyes: Conjunctivae and EOM are normal. Pupils are equal, round, and reactive to light.  Neck: Normal range of motion. Neck supple. No JVD present. No thyromegaly present.  Cardiovascular: Normal rate, regular rhythm and normal heart sounds.   No murmur heard. Pulmonary/Chest: Effort normal and breath sounds normal. She has no wheezes. She has no rales.  Abdominal: Soft. Bowel sounds are normal. She exhibits no distension and no mass. There is no tenderness.  Musculoskeletal: Normal range of  motion. She exhibits no edema and no tenderness.  Unstable gait  Lymphadenopathy:    She has no cervical adenopathy.  Neurological: She is alert and oriented to person, place, and time. She has normal reflexes. No cranial nerve deficit. She exhibits normal muscle tone. Coordination normal.  Skin: Skin is warm and dry. No rash noted. She is not diaphoretic. No erythema.  Psychiatric: She has a normal mood and affect. Her behavior is normal. Judgment and thought content normal.     Labs reviewed: No visits with results within 3 Month(s) from this visit. Latest known visit with results is:  [facility] 12[facility]  Component Date Value Ref Range Status  . Hemoglobin 06/18/2013 13.3  12.0 - 16.0 g/dL Final  . HCT 06/18/2013 39  36 - 46 % Final  . Glucose 06/18/2013 112   Final  . BUN 06/18/2013 21  4 - 21 mg/dL Final  . Creatinine 06/18/2013 1.2* 0.5 - 1.1 mg/dL Final  . Potassium 06/18/2013 4.4  3.4 - 5.3 mmol/L Final  . Sodium 06/18/2013 140  137 - 147 mmol/L Final    Assessment/Plan  1. Essential hypertension Adequately controlled  2. Insomnia improved  3. Generalized anxiety disorder inmproved  4. Polyneuropathy in other diseases classified elsewhere Improving on gabapentin  5. Restless legs syndrome (RLS) Controlled on gabapentin  6. Abnormality of gait Continue to use walker

## 2014-06-27 ENCOUNTER — Other Ambulatory Visit: Payer: Self-pay | Admitting: Internal Medicine

## 2014-07-14 DIAGNOSIS — H3552 Pigmentary retinal dystrophy: Secondary | ICD-10-CM | POA: Diagnosis not present

## 2014-07-14 DIAGNOSIS — H3532 Exudative age-related macular degeneration: Secondary | ICD-10-CM | POA: Diagnosis not present

## 2014-07-20 DIAGNOSIS — N182 Chronic kidney disease, stage 2 (mild): Secondary | ICD-10-CM | POA: Diagnosis not present

## 2014-07-20 DIAGNOSIS — E559 Vitamin D deficiency, unspecified: Secondary | ICD-10-CM | POA: Diagnosis not present

## 2014-07-26 DIAGNOSIS — Z23 Encounter for immunization: Secondary | ICD-10-CM | POA: Diagnosis not present

## 2014-07-27 ENCOUNTER — Other Ambulatory Visit: Payer: Self-pay | Admitting: Internal Medicine

## 2014-07-29 DIAGNOSIS — I129 Hypertensive chronic kidney disease with stage 1 through stage 4 chronic kidney disease, or unspecified chronic kidney disease: Secondary | ICD-10-CM | POA: Diagnosis not present

## 2014-07-29 DIAGNOSIS — N182 Chronic kidney disease, stage 2 (mild): Secondary | ICD-10-CM | POA: Diagnosis not present

## 2014-08-18 DIAGNOSIS — H3532 Exudative age-related macular degeneration: Secondary | ICD-10-CM | POA: Diagnosis not present

## 2014-08-18 DIAGNOSIS — H35051 Retinal neovascularization, unspecified, right eye: Secondary | ICD-10-CM | POA: Diagnosis not present

## 2014-09-09 ENCOUNTER — Encounter: Payer: Self-pay | Admitting: Nurse Practitioner

## 2014-09-09 ENCOUNTER — Non-Acute Institutional Stay: Payer: Medicare Other | Admitting: Nurse Practitioner

## 2014-09-09 VITALS — BP 196/86 | HR 72 | Temp 97.7°F | Wt 153.0 lb

## 2014-09-09 DIAGNOSIS — I10 Essential (primary) hypertension: Secondary | ICD-10-CM

## 2014-09-09 DIAGNOSIS — K5900 Constipation, unspecified: Secondary | ICD-10-CM | POA: Diagnosis not present

## 2014-09-09 DIAGNOSIS — F411 Generalized anxiety disorder: Secondary | ICD-10-CM | POA: Diagnosis not present

## 2014-09-09 DIAGNOSIS — R609 Edema, unspecified: Secondary | ICD-10-CM

## 2014-09-09 DIAGNOSIS — G629 Polyneuropathy, unspecified: Secondary | ICD-10-CM

## 2014-09-09 DIAGNOSIS — G4700 Insomnia, unspecified: Secondary | ICD-10-CM | POA: Diagnosis not present

## 2014-09-09 DIAGNOSIS — K219 Gastro-esophageal reflux disease without esophagitis: Secondary | ICD-10-CM | POA: Diagnosis not present

## 2014-09-09 NOTE — Assessment & Plan Note (Addendum)
Controlled on Nifedipine 60mg , Labetalol 100mg , and Telmisartan 80mg . Sbp elevated today-the patient denied HA, dizziness, chest pain or pressure. She attributed to her tooth root canal tx

## 2014-09-09 NOTE — Assessment & Plan Note (Signed)
Able to reduce Ativan form 0.5mg  bid to 0.25mg  qod.  09/09/14 stable.

## 2014-09-09 NOTE — Assessment & Plan Note (Signed)
Bloated and occasionally nausea since Gabapentin started May 2015-Omeprazole helped in the past. Will resume Omeprazole 20mg  daily x 8 week then 10mg  daily x 4 weeks then dc. Observe.

## 2014-09-09 NOTE — Assessment & Plan Note (Signed)
Stable. Takes Psyllium tid.

## 2014-09-09 NOTE — Assessment & Plan Note (Addendum)
Stated she had nerve conduction study. Has off and on prickling and burning sensation, spasm, shooting pain. [facility] referral. Didn't desire any pain management.  02/2014 saw Dr Jannifer Franklin: restless leg and neuropathy.  09/09/14 no significant change.

## 2014-09-09 NOTE — Progress Notes (Signed)
Patient ID: Jacqueline Dodson, female   DOB: Dec 12, 1926, 80 y.o.   MRN: [id number]    Allergies  Allergen Reactions  . Amlodipine   . Amoxicillin   . Enalapril   . Hctz [Hydrochlorothiazide]   . Penicillins     Chief Complaint  Patient presents with  . Abdominal Pain    started after starting Gabapentin 5/12th Dr. Jannifer Franklin, having rumbling in intestines. No diarrhea, slight nausea.    HPI: Patient is a 80 y.o. female seen in the clinic at [facility] today for evaluation of her chronic medical conditions.  Problem List Items Addressed This Visit    Peripheral neuropathy    Stated she had nerve conduction study. Has off and on prickling and burning sensation, spasm, shooting pain. [facility] referral. Didn't desire any pain management.  02/2014 saw Dr Jannifer Franklin: restless leg and neuropathy.  09/09/14 no significant change.     Insomnia    Stopped taking Temazepam--then Ativan 0.5mg  bid then 0.25mg  qod-successful dose reduction.  09/09/14 sleeps well.        HTN (hypertension) (Chronic)    Controlled on Nifedipine 60mg , Labetalol 100mg , and Telmisartan 80mg . Sbp elevated today-the patient denied HA, dizziness, chest pain or pressure. She attributed to her tooth root canal tx        Relevant Medications      furosemide (LASIX) 20 MG tablet   GERD (gastroesophageal reflux disease)    Bloated and occasionally nausea since Gabapentin started May 2015-Omeprazole helped in the past. Will resume Omeprazole 20mg  daily x 8 week then 10mg  daily x 4 weeks then dc. Observe.     Relevant Medications      omeprazole (PRILOSEC) 20 MG capsule      calcium carbonate (TUMS - DOSED IN MG ELEMENTAL CALCIUM) 500 MG chewable tablet   Generalized anxiety disorder - Primary (Chronic)    Able to reduce Ativan form 0.5mg  bid to 0.25mg  qod.  09/09/14 stable.       Edema    New since Gabapentin started May 2015-better since reduced to 100mg  tid(no longer needing Furosemide 20mg  daily prn)      Constipation    Stable. Takes Psyllium tid.         Review of Systems:  Review of Systems  Constitutional: Negative for fever, chills, weight loss, malaise/fatigue and diaphoresis.  HENT: Negative for congestion, ear pain, hearing loss and sore throat.   Eyes: Negative for pain, discharge and redness.  Respiratory: Negative for cough, sputum production, shortness of breath and wheezing.   Cardiovascular: Positive for leg swelling. Negative for chest pain, palpitations, orthopnea, claudication and PND.       BLE R(1+)>L(trace)  Gastrointestinal: Negative for nausea, vomiting, abdominal pain, diarrhea and constipation.       Bloated since Gabapentin(Omeprazole helped in the past)  Genitourinary: Positive for frequency. Negative for dysuria, urgency and flank pain.       Cystocele stage III out side her body now.   Musculoskeletal: Positive for back pain (right SIJ-reoslved. ) and joint pain (right hip and knee, much improved. ). Negative for myalgias, falls and neck pain.  Skin: Negative for itching and rash.  Neurological: Positive for tingling and sensory change (was peripheral neuropathy in RLE--better now). Negative for dizziness, tremors, speech change, focal weakness, seizures, loss of consciousness, weakness and headaches.       Tingling and burning sensation, muscle spasm, shooting pain in BLE on and off.   Endo/Heme/Allergies: Negative for environmental allergies and polydipsia. Does not  bruise/bleed easily.  Psychiatric/Behavioral: Negative for depression, hallucinations and memory loss. The patient is nervous/anxious (better managed with Ativan. ) and has insomnia (improved. ).      Past Medical History  Diagnosis Date  . Dermatophytosis of the body   . Anxiety   . Restless legs syndrome (RLS)   . Primary cerebellar degeneration   . Unspecified hereditary and idiopathic peripheral neuropathy   . Macular degeneration (senile) of retina, unspecified   . Hypertension   .  Left bundle branch hemiblock   . Acute, but ill-defined, cerebrovascular disease   . Hemorrhoids   . Orthostatic hypotension   . GERD (gastroesophageal reflux disease)   . Duodenal ulcer, unspecified as acute or chronic, without hemorrhage, perforation, or obstruction   . Unspecified constipation   . Chronic kidney disease, stage II (mild)   . Cystocele, midline   . Rectocele   . Postmenopausal atrophic vaginitis   . Lumbago   . Myalgia and myositis, unspecified   . Dizziness and giddiness   . Other malaise and fatigue   . Abnormality of gait   . Disturbance of skin sensation   . Edema   . Urgency of urination   . Other abnormal blood chemistry   . Elevated blood pressure reading without diagnosis of hypertension   . Personal history of fall   . Arthritis   . Polyneuropathy in other diseases classified elsewhere 02/16/2014   Past Surgical History  Procedure Laterality Date  . Tonsillectomy  1940  . Appendectomy  1945  . External ear surgery  2010    left outer ear basel cell removed  . Abdominal hysterectomy  1972  . Cataract extraction Bilateral    Social History:   reports that she has never smoked. She has never used smokeless tobacco. She reports that she does not drink alcohol or use illicit drugs.  Family History  Problem Relation Age of Onset  . Aortic stenosis Mother   . Heart failure Mother   . Thrombosis Father   . Coronary artery disease Father   . Ataxia Sister   . Ataxia Brother   . Heart disease Brother   . Cancer Brother     Prostate  . Stroke Brother     Medications: Patient's Medications  New Prescriptions   No medications on file  Previous Medications   ACETAMINOPHEN (TYLENOL EXTRA STRENGTH PO)    Take by mouth. 2 every 6 hours for dental pain   CALCIUM CARBONATE (TUMS - DOSED IN MG ELEMENTAL CALCIUM) 500 MG CHEWABLE TABLET    Chew 1 tablet by mouth. Take 2 after meals up to 8 10 daily   CLINDAMYCIN (CLEOCIN) 150 MG CAPSULE    2 capsules now,  then one capsule every 6 hours until finished   FUROSEMIDE (LASIX) 20 MG TABLET    Take 20 mg by mouth. Take one daily as needed for swelling (Dr. Elmarie Shiley   GABAPENTIN (NEURONTIN) 100 MG CAPSULE    Take 1 tablet by mouth at breakfast, 1 tablet at 2:00,  and 1 tablet at bedtime.   LABETALOL (NORMODYNE) 100 MG TABLET    TAKE 1 TABLET TWICE A DAY TO CONTROL BLOOD PRESSURE   LORAZEPAM (ATIVAN) 0.5 MG TABLET    TAKE 1 TABLET BY MOUTH EVERY 12 HOURS   MULTIPLE VITAMINS-MINERALS (ICAPS) CAPS    Take by mouth. Take one daily   MULTIPLE VITAMINS-MINERALS (MULTIVITAMIN WITH MINERALS) TABLET    Take 1 tablet by mouth daily.   NIFEDIPINE (  PROCARDIA-XL/ADALAT CC) 60 MG 24 HR TABLET    TAKE 1 TABLET DAILY FOR BLOOD PRESSURE   OMEPRAZOLE (PRILOSEC) 20 MG CAPSULE    Take 20 mg by mouth. Take one daily for 14 days every 4 months   PSYLLIUM (METAMUCIL) 58.6 % POWDER    Take 1 packet by mouth 3 (three) times daily.   RANIBIZUMAB (LUCENTIS) 0.3 MG/0.05ML SOLN    Inject into the eye.   TELMISARTAN (MICARDIS) 80 MG TABLET    TAKE 1 TABLET BY MOUTH EVERY DAY FOR BLOOD PRESSURE   TOLNAFTATE (TINACTIN) 1 % CREAM    Apply topically 2 (two) times daily. To rash  Modified Medications   No medications on file  Discontinued Medications   No medications on file     Physical Exam: Physical Exam  Constitutional: She is oriented to person, place, and time. She appears well-developed and well-nourished. No distress.  HENT:  Head: Normocephalic and atraumatic.  Eyes: EOM are normal. Pupils are equal, round, and reactive to light. Right eye exhibits no discharge.  Neck: Normal range of motion. Neck supple. No JVD present. No thyromegaly present.  Cardiovascular: Normal rate, regular rhythm and normal heart sounds.   No murmur heard. Pulmonary/Chest: Effort normal and breath sounds normal. She has no wheezes. She has no rales.  Abdominal: Soft. Bowel sounds are normal. She exhibits no distension. There is no tenderness.    Musculoskeletal: Normal range of motion. She exhibits edema and tenderness (right SIJ, hip, knee tenderness, resolved. ).  BLE R(1+)>L(trace)  Lymphadenopathy:    She has no cervical adenopathy.  Neurological: She is alert and oriented to person, place, and time. She has normal reflexes. No cranial nerve deficit. She exhibits normal muscle tone. Coordination normal.  Skin: Skin is warm and dry. No rash noted. She is not diaphoretic. No erythema.  Psychiatric: She has a normal mood and affect. Her behavior is normal. Judgment and thought content normal.    Filed Vitals:   09/09/14 1624  BP: 196/86  Pulse: 72  Temp: 97.7 F (36.5 C)  TempSrc: Oral  Weight: 153 lb (69.4 kg)      Labs reviewed: Basic Metabolic Panel: No results for input(s): NA, K, CL, CO2, GLUCOSE, BUN, CREATININE, CALCIUM, MG, PHOS, TSH in the last 8760 hours. CBC: No results for input(s): WBC, NEUTROABS, HGB, HCT, MCV, PLT in the last 8760 hours.  Past Procedures:  01/22/13 X-ray R knee showed mild degenerative changes. R pelvic and bilateral hips: advanced degenerative disc ?facet.   Assessment/Plan Generalized anxiety disorder Able to reduce Ativan form 0.5mg  bid to 0.25mg  qod.  09/09/14 stable.     HTN (hypertension) Controlled on Nifedipine 60mg , Labetalol 100mg , and Telmisartan 80mg . Sbp elevated today-the patient denied HA, dizziness, chest pain or pressure. She attributed to her tooth root canal tx      Insomnia Stopped taking Temazepam--then Ativan 0.5mg  bid then 0.25mg  qod-successful dose reduction.  09/09/14 sleeps well.      Peripheral neuropathy Stated she had nerve conduction study. Has off and on prickling and burning sensation, spasm, shooting pain. [facility] referral. Didn't desire any pain management.  02/2014 saw Dr Jannifer Franklin: restless leg and neuropathy.  09/09/14 no significant change.   Constipation Stable. Takes Psyllium tid.    GERD (gastroesophageal reflux  disease) Bloated and occasionally nausea since Gabapentin started May 2015-Omeprazole helped in the past. Will resume Omeprazole 20mg  daily x 8 week then 10mg  daily x 4 weeks then dc. Observe.   Edema New since  Gabapentin started May 2015-better since reduced to 100mg  tid(no longer needing Furosemide 20mg  daily prn)    Family/ Staff Communication: observe the patient.   Goals of Care: [place]  Labs/tests ordered: none

## 2014-09-09 NOTE — Assessment & Plan Note (Signed)
Stopped taking Temazepam--then Ativan 0.5mg  bid then 0.25mg  qod-successful dose reduction.  09/09/14 sleeps well.

## 2014-09-09 NOTE — Assessment & Plan Note (Signed)
New since Gabapentin started May 2015-better since reduced to 100mg  tid(no longer needing Furosemide 20mg  daily prn)

## 2014-10-04 DIAGNOSIS — H35051 Retinal neovascularization, unspecified, right eye: Secondary | ICD-10-CM | POA: Diagnosis not present

## 2014-10-04 DIAGNOSIS — H3532 Exudative age-related macular degeneration: Secondary | ICD-10-CM | POA: Diagnosis not present

## 2014-10-26 ENCOUNTER — Encounter: Payer: Self-pay | Admitting: Adult Health

## 2014-10-26 ENCOUNTER — Ambulatory Visit (INDEPENDENT_AMBULATORY_CARE_PROVIDER_SITE_OTHER): Payer: Medicare Other | Admitting: Adult Health

## 2014-10-26 VITALS — BP 154/74 | HR 69 | Ht 65.0 in | Wt 153.8 lb

## 2014-10-26 DIAGNOSIS — G63 Polyneuropathy in diseases classified elsewhere: Secondary | ICD-10-CM | POA: Diagnosis not present

## 2014-10-26 DIAGNOSIS — R269 Unspecified abnormalities of gait and mobility: Secondary | ICD-10-CM | POA: Diagnosis not present

## 2014-10-26 DIAGNOSIS — G2581 Restless legs syndrome: Secondary | ICD-10-CM

## 2014-10-26 MED ORDER — GABAPENTIN 100 MG PO CAPS: ORAL_CAPSULE | ORAL | Status: DC

## 2014-10-26 NOTE — Progress Notes (Signed)
PATIENT: Jacqueline Dodson DOB: 05/13/1927  REASON FOR VISIT: follow up- peripheral neuropathy, abnormality of gait,RLS  HISTORY FROM: patient  HISTORY OF PRESENT ILLNESS: Jacqueline Dodson is an 81 year old female with a history of abnormality of gait, peripheral neuropathy and RLS. She returns today for follow-up. At the last visit her gabapentin was increased to 100 mg 1 tablet in the morning and at noon and 2 tablets in the evening. She reports that she tried this but it caused swelling in her feet and she didn't notice the benefit. She decrease her dose back to 1 tablet TID. She states that her sleeping is affected but not every night. She states that it varies. She states that most of the time it is due to burning/ tingling in the feet. She states that during the day she really does not have any discomfort in the feet.  She states that her gait and balance has remained the same. Denies any falls. She continues to use a rollator when ambulating.   HISTORY 06/23/14: [name] is an 81 year old female with a history of abnormality of gait. She returns today for follow-up. She has had an EMG in the past that confirmed peripheral neuropathy. She also has RLS and was taking restoril. She was started on gabapentin 100 mg TID at the last visit. She states that during the day her symptoms are controlled but at night she has increased discomfort and that causes her to not  sleep well. She occasionally will have electric like sensations in the legs during the day. She has some numbness in the feet that she notices more at night. She describes this as feeling like her feet are wrapped really tight.  She reports no change in her walking. She uses a rollator when ambulating. Denies any recent falls.    HISTORY 02/16/14 (CW): 81 year old right-handed white female with a history of a gait disorder that dates back 15 years or more. The patient in retrospect also noted that she has had some restless leg syndrome symptoms for  greater than 30 years. She reports some tingling sensations in the feet and legs that may bother her at nighttime. She indicates that she has been on 15 mg of Restoril for quite some time, and she tried to come off of this medication recently. She indicated that the restless legs syndrome symptoms worsened during the day, and she has had some tingling sensations in the feet at night that may keep her from sleeping. She has gone back on the Restoril, and the restless legs symptoms have improved, and she is sleeping better, but she still has symptoms at night. She occasionally will have a shock sensation in the leg. She indicates that she has several brothers with an ataxia syndrome and a severe gait disorder. Five years ago, she had EMG and nerve conduction studies that confirmed the presence of a peripheral neuropathy with nerve conductions in the mid to upper 30s. The sensory latencies were absent in the legs. She currently uses a walker for ambulation, and she has been using a walker for 2 years. She denies any recent falls. She does report some difficulty with urinary incontinence, no problems controlling the bowels. She is sent to this office for further evaluation.  REVIEW OF SYSTEMS: Out of a complete 14 system review of symptoms, the patient complains only of the following symptoms, and all other reviewed systems are negative.  Fatigue, drooling, loss of vision, leg swelling, nausea, incontinence of bowel,  restless legs, insomnia, frequent waking, incontinence of bladder, itching, tremors    ALLERGIES: Allergies  Allergen Reactions  . Amlodipine   . Amoxicillin   . Enalapril   . Hctz [Hydrochlorothiazide]   . Penicillins     HOME MEDICATIONS: Outpatient Prescriptions Prior to Visit  Medication Sig Dispense Refill  . Acetaminophen (TYLENOL EXTRA STRENGTH PO) Take by mouth. 2 every 6 hours for dental pain    . calcium carbonate (TUMS - DOSED IN MG ELEMENTAL CALCIUM) 500 MG chewable tablet  Chew 1 tablet by mouth. Take 2 after meals up to 8 10 daily    . furosemide (LASIX) 20 MG tablet Take 20 mg by mouth. Take one daily as needed for swelling (Dr. Elmarie Shiley  4  . gabapentin (NEURONTIN) 100 MG capsule Take 1 tablet by mouth at breakfast, 1 tablet at 2:00,  and 1 tablet at bedtime.    Marland Kitchen labetalol (NORMODYNE) 100 MG tablet TAKE 1 TABLET TWICE A DAY TO CONTROL BLOOD PRESSURE 60 tablet 4  . LORazepam (ATIVAN) 0.5 MG tablet TAKE 1 TABLET BY MOUTH EVERY 12 HOURS 60 tablet 2  . Multiple Vitamins-Minerals (ICAPS) CAPS Take by mouth. Take one daily    . Multiple Vitamins-Minerals (MULTIVITAMIN WITH MINERALS) tablet Take 1 tablet by mouth daily.    Marland Kitchen NIFEdipine (PROCARDIA-XL/ADALAT CC) 60 MG 24 hr tablet TAKE 1 TABLET DAILY FOR BLOOD PRESSURE 60 tablet 5  . omeprazole (PRILOSEC) 20 MG capsule Take 20 mg by mouth. Take one daily for 14 days every 4 months    . psyllium (METAMUCIL) 58.6 % powder Take 1 packet by mouth 3 (three) times daily.    . Ranibizumab (LUCENTIS) 0.3 MG/0.05ML SOLN Inject into the eye.    . telmisartan (MICARDIS) 80 MG tablet TAKE 1 TABLET BY MOUTH EVERY DAY FOR BLOOD PRESSURE 30 tablet 3  . clindamycin (CLEOCIN) 150 MG capsule 2 capsules now, then one capsule every 6 hours until finished    . tolnaftate (TINACTIN) 1 % cream Apply topically 2 (two) times daily. To rash     No facility-administered medications prior to visit.    PAST MEDICAL HISTORY: Past Medical History  Diagnosis Date  . Dermatophytosis of the body   . Anxiety   . Restless legs syndrome (RLS)   . Primary cerebellar degeneration   . Unspecified hereditary and idiopathic peripheral neuropathy   . Macular degeneration (senile) of retina, unspecified   . Hypertension   . Left bundle branch hemiblock   . Acute, but ill-defined, cerebrovascular disease   . Hemorrhoids   . Orthostatic hypotension   . GERD (gastroesophageal reflux disease)   . Duodenal ulcer, unspecified as acute or chronic, without  hemorrhage, perforation, or obstruction   . Unspecified constipation   . Chronic kidney disease, stage II (mild)   . Cystocele, midline   . Rectocele   . Postmenopausal atrophic vaginitis   . Lumbago   . Myalgia and myositis, unspecified   . Dizziness and giddiness   . Other malaise and fatigue   . Abnormality of gait   . Disturbance of skin sensation   . Edema   . Urgency of urination   . Other abnormal blood chemistry   . Elevated blood pressure reading without diagnosis of hypertension   . Personal history of fall   . Arthritis   . Polyneuropathy in other diseases classified elsewhere 02/16/2014    PAST SURGICAL HISTORY: Past Surgical History  Procedure Laterality Date  . Tonsillectomy  1940  .  Appendectomy  1945  . External ear surgery  2010    left outer ear basel cell removed  . Abdominal hysterectomy  1972  . Cataract extraction Bilateral     FAMILY HISTORY: Family History  Problem Relation Age of Onset  . Aortic stenosis Mother   . Heart failure Mother   . Thrombosis Father   . Coronary artery disease Father   . Ataxia Sister   . Ataxia Brother   . Heart disease Brother   . Cancer Brother     Prostate  . Stroke Brother        PHYSICAL EXAM  Filed Vitals:   10/26/14 1050  Height: $Remove'5\' 5"'sbMlwOy$  (1.651 m)  Weight: 153 lb 12.8 oz (69.763 kg)   Body mass index is 25.59 kg/(m^2).  Generalized: Well developed, in no acute distress   Neurological examination  Mentation: Alert oriented to time, place, history taking. Follows all commands speech and language fluent Cranial nerve II-XII: Pupils were equal round reactive to light. Extraocular movements were full, visual field were full on confrontational test. Facial sensation and strength were normal. Uvula tongue midline. Head turning and shoulder shrug  were normal and symmetric. Motor: The motor testing reveals 5 over 5 strength of all 4 extremities. Good symmetric motor tone is noted throughout.  Sensory:  Sensory testing is intact to soft touch on all 4 extremities. Pinprick decreased in the feet and extends to just above the ankle.No evidence of extinction is noted.  Coordination: Cerebellar testing reveals good finger-nose-finger and heel-to-shin bilaterally.  Gait and station: Gait is slightly wide-based- uses a rollator to ambulate. Tandem gait not attempted. Romberg is negative. No drift is seen.  Reflexes: Deep tendon reflexes are symmetric and normal bilaterally.    DIAGNOSTIC DATA (LABS, IMAGING, TESTING) - I reviewed patient records, labs, notes, testing and imaging myself where available.  Lab Results  Component Value Date   WBC 5.5 07/15/2012   HGB 13.3 06/18/2013   HCT 39 06/18/2013   MCV 87.1 07/23/2010   PLT 213 07/15/2012      Component Value Date/Time   NA 140 06/18/2013   NA 133* 07/25/2010 0505   K 4.4 06/18/2013   CL 106 07/25/2010 0505   CO2 23 07/25/2010 0505   GLUCOSE 107* 07/25/2010 0505   BUN 21 06/18/2013   BUN 13 07/25/2010 0505   CREATININE 1.2* 06/18/2013   CREATININE 1.11 07/25/2010 0505   CALCIUM 8.0* 07/25/2010 0505   GFRNONAA 47* 07/25/2010 0505   GFRAA * 07/25/2010 0505    64        The eGFR has been calculated using the MDRD equation. This calculation has not been validated in all clinical situations. eGFR's persistently <60 mL/min signify possible Chronic Kidney Disease.   Lab Results  Component Value Date   TSH 2.17 07/15/2012      ASSESSMENT AND PLAN 81 y.o. year old female  has a past medical history of Dermatophytosis of the body; Anxiety; Restless legs syndrome (RLS); Primary cerebellar degeneration; Unspecified hereditary and idiopathic peripheral neuropathy; Macular degeneration (senile) of retina, unspecified; Hypertension; Left bundle branch hemiblock; Acute, but ill-defined, cerebrovascular disease; Hemorrhoids; Orthostatic hypotension; GERD (gastroesophageal reflux disease); Duodenal ulcer, unspecified as acute or chronic,  without hemorrhage, perforation, or obstruction; Unspecified constipation; Chronic kidney disease, stage II (mild); Cystocele, midline; Rectocele; Postmenopausal atrophic vaginitis; Lumbago; Myalgia and myositis, unspecified; Dizziness and giddiness; Other malaise and fatigue; Abnormality of gait; Disturbance of skin sensation; Edema; Urgency of urination; Other abnormal blood chemistry;  Elevated blood pressure reading without diagnosis of hypertension; Personal history of fall; Arthritis; and Polyneuropathy in other diseases classified elsewhere (02/16/2014). here with:  1. Peripheral neuropathy 2. RLS 3. Abnormality of gait  Overall the patient has remained stable. She continues to have discomfort in her feet at night. She could not  tolerate the increase in gabapentin. The patient states that she has tried Cymbalta in the past but was also unable to tolerate that medication. The patient would rather not try any new medication. I have advised the patient to take 1 tablet of gabapentin in the morning and 2 tablets at bedtime. She will let me know if this is not beneficial for her discomfort at night. If her symptoms worsen or she develops new symptoms she should let us know. Otherwise she will follow up in 6 months.   Ward Givens, MSN, NP-C 10/26/2014, 10:57 AM [facility] [address] [place],  [address] [phone]  Note: This document was prepared with digital dictation and possible smart phrase technology. Any transcriptional errors that result from this process are unintentional.

## 2014-10-26 NOTE — Patient Instructions (Signed)
Take 1 tablet of gabapentin in the morning and two tablets in the evening.  If this does not improve your discomfort at night please let us know.

## 2014-10-26 NOTE — Progress Notes (Signed)
I have read the note, and I agree with the clinical assessment and plan.  Jacqueline Dodson KEITH   

## 2014-11-11 DIAGNOSIS — H35051 Retinal neovascularization, unspecified, right eye: Secondary | ICD-10-CM | POA: Diagnosis not present

## 2014-11-11 DIAGNOSIS — H3532 Exudative age-related macular degeneration: Secondary | ICD-10-CM | POA: Diagnosis not present

## 2014-11-18 ENCOUNTER — Other Ambulatory Visit: Payer: Self-pay

## 2014-11-18 MED ORDER — OMEPRAZOLE 10 MG PO CPDR: DELAYED_RELEASE_CAPSULE | ORAL | Status: DC

## 2014-11-18 MED ORDER — OMEPRAZOLE 20 MG PO CPDR: DELAYED_RELEASE_CAPSULE | ORAL | Status: DC

## 2014-11-29 ENCOUNTER — Other Ambulatory Visit: Payer: Self-pay | Admitting: Internal Medicine

## 2014-12-12 ENCOUNTER — Emergency Department (HOSPITAL_COMMUNITY): Payer: Medicare Other

## 2014-12-12 ENCOUNTER — Emergency Department (HOSPITAL_COMMUNITY)
Admission: EM | Admit: 2014-12-12 | Discharge: 2014-12-12 | Disposition: A | Discharge status: Home / Self Care | Payer: Medicare Other | Attending: Emergency Medicine | Admitting: Emergency Medicine

## 2014-12-12 ENCOUNTER — Encounter (HOSPITAL_COMMUNITY): Payer: Self-pay | Admitting: Emergency Medicine

## 2014-12-12 DIAGNOSIS — I129 Hypertensive chronic kidney disease with stage 1 through stage 4 chronic kidney disease, or unspecified chronic kidney disease: Secondary | ICD-10-CM | POA: Diagnosis not present

## 2014-12-12 DIAGNOSIS — F419 Anxiety disorder, unspecified: Secondary | ICD-10-CM | POA: Insufficient documentation

## 2014-12-12 DIAGNOSIS — R1310 Dysphagia, unspecified: Secondary | ICD-10-CM | POA: Insufficient documentation

## 2014-12-12 DIAGNOSIS — N182 Chronic kidney disease, stage 2 (mild): Secondary | ICD-10-CM | POA: Insufficient documentation

## 2014-12-12 DIAGNOSIS — Z880 Allergy status to penicillin: Secondary | ICD-10-CM | POA: Diagnosis not present

## 2014-12-12 DIAGNOSIS — Z8742 Personal history of other diseases of the female genital tract: Secondary | ICD-10-CM | POA: Insufficient documentation

## 2014-12-12 DIAGNOSIS — R1013 Epigastric pain: Secondary | ICD-10-CM | POA: Insufficient documentation

## 2014-12-12 DIAGNOSIS — Z8669 Personal history of other diseases of the nervous system and sense organs: Secondary | ICD-10-CM | POA: Diagnosis not present

## 2014-12-12 DIAGNOSIS — Z8739 Personal history of other diseases of the musculoskeletal system and connective tissue: Secondary | ICD-10-CM | POA: Insufficient documentation

## 2014-12-12 DIAGNOSIS — K219 Gastro-esophageal reflux disease without esophagitis: Secondary | ICD-10-CM | POA: Diagnosis not present

## 2014-12-12 DIAGNOSIS — R0989 Other specified symptoms and signs involving the circulatory and respiratory systems: Secondary | ICD-10-CM | POA: Diagnosis not present

## 2014-12-12 DIAGNOSIS — Z9181 History of falling: Secondary | ICD-10-CM | POA: Insufficient documentation

## 2014-12-12 DIAGNOSIS — I447 Left bundle-branch block, unspecified: Secondary | ICD-10-CM | POA: Diagnosis not present

## 2014-12-12 LAB — CBC WITH DIFFERENTIAL/PLATELET
BASOS ABS: 0.1 10*3/uL (ref 0.0–0.1)
BASOS PCT: 1 % (ref 0–1)
Eosinophils Absolute: 0 10*3/uL (ref 0.0–0.7)
Eosinophils Relative: 0 % (ref 0–5)
HCT: 42.8 % (ref 36.0–46.0)
HEMOGLOBIN: 15.1 g/dL — AB (ref 12.0–15.0)
Lymphocytes Relative: 12 % (ref 12–46)
Lymphs Abs: 1.2 10*3/uL (ref 0.7–4.0)
MCH: 31.1 pg (ref 26.0–34.0)
MCHC: 35.3 g/dL (ref 30.0–36.0)
MCV: 88.1 fL (ref 78.0–100.0)
Monocytes Absolute: 0.5 10*3/uL (ref 0.1–1.0)
Monocytes Relative: 5 % (ref 3–12)
NEUTROS ABS: 8.7 10*3/uL — AB (ref 1.7–7.7)
NEUTROS PCT: 82 % — AB (ref 43–77)
Platelets: 228 10*3/uL (ref 150–400)
RBC: 4.86 MIL/uL (ref 3.87–5.11)
RDW: 12.2 % (ref 11.5–15.5)
WBC: 10.5 10*3/uL (ref 4.0–10.5)

## 2014-12-12 LAB — COMPREHENSIVE METABOLIC PANEL
ALK PHOS: 82 U/L (ref 39–117)
ALT: 28 U/L (ref 0–35)
AST: 37 U/L (ref 0–37)
Albumin: 4.8 g/dL (ref 3.5–5.2)
Anion gap: 11 (ref 5–15)
BILIRUBIN TOTAL: 0.9 mg/dL (ref 0.3–1.2)
BUN: 30 mg/dL — ABNORMAL HIGH (ref 6–23)
CO2: 22 mmol/L (ref 19–32)
Calcium: 9.9 mg/dL (ref 8.4–10.5)
Chloride: 108 mmol/L (ref 96–112)
Creatinine, Ser: 1.46 mg/dL — ABNORMAL HIGH (ref 0.50–1.10)
GFR calc non Af Amer: 32 mL/min — ABNORMAL LOW (ref >90)
GFR, EST AFRICAN AMERICAN: 37 mL/min — AB (ref >90)
Glucose, Bld: 130 mg/dL — ABNORMAL HIGH (ref 70–99)
POTASSIUM: 4.2 mmol/L (ref 3.5–5.1)
Sodium: 141 mmol/L (ref 135–145)
TOTAL PROTEIN: 8.5 g/dL — AB (ref 6.0–8.3)

## 2014-12-12 LAB — LIPASE, BLOOD: Lipase: 25 U/L (ref 11–59)

## 2014-12-12 LAB — TROPONIN I

## 2014-12-12 MED ORDER — SODIUM CHLORIDE 0.9 % IV BOLUS (SEPSIS)
1000.0000 mL | Freq: Once | INTRAVENOUS | Status: AC
  Administered 2014-12-12: 1000 mL via INTRAVENOUS

## 2014-12-12 NOTE — ED Provider Notes (Signed)
CSN: [id number]     Arrival date & time 12/12/14  1517 History   First MD Initiated Contact with Patient 12/12/14 1640     Chief Complaint  Patient presents with  . Dysphagia     (Consider location/radiation/quality/duration/timing/severity/associated sxs/prior Treatment) The history is provided by the patient. No language interpreter was used.  Jacqueline Dodson is an 81 year old female with past medical history of anxiety, restless leg syndrome, primary cerebellar degeneration, macular degeneration, hypertension, left bundle branch block, duodenal ulcers, lumbar go presenting to the emergency department with dysphagia that has been ongoing since 12:00 PM. Patient reported that while she was at lunch with her family, while eating steak and a carrot casserole, she took a couple of bites and then all of a sudden was unable to swallow. Patient denied pain with swallowing, reported that there was difficulty with swallowing. Patient reported that suddenly she went into a coughing fit and later threw up the food. Stated there was no blood in the vomit. Patient reported that since then she has been unable to swallow her saliva or ginger ale-reported that she keeps it down for short period of time, but states that she then goes into a coughing fit resulting in spitting up the contents. Reported that her stomach feels distended. Reported that she's been having epigastric pain and some discomfort in her chest. Patient reported that she has similar episode last week that lasted for only approximately 3-4 minutes. Denied ever having endoscopy or barium swallow study. Denied having a gastric urologist. Denied hemoptysis, hematemesis, issues with passing gas, bowel movement changes, melena, hematochezia, dysuria, hematuria, decreased urine, shortness of breath, difficulty breathing, visual changes. PCP Dr. Nyoka Cowden  Past Medical History  Diagnosis Date  . Dermatophytosis of the body   . Anxiety   . Restless legs syndrome  (RLS)   . Primary cerebellar degeneration   . Unspecified hereditary and idiopathic peripheral neuropathy   . Macular degeneration (senile) of retina, unspecified   . Hypertension   . Left bundle branch hemiblock   . Acute, but ill-defined, cerebrovascular disease   . Hemorrhoids   . Orthostatic hypotension   . GERD (gastroesophageal reflux disease)   . Duodenal ulcer, unspecified as acute or chronic, without hemorrhage, perforation, or obstruction   . Unspecified constipation   . Chronic kidney disease, stage II (mild)   . Cystocele, midline   . Rectocele   . Postmenopausal atrophic vaginitis   . Lumbago   . Myalgia and myositis, unspecified   . Dizziness and giddiness   . Other malaise and fatigue   . Abnormality of gait   . Disturbance of skin sensation   . Edema   . Urgency of urination   . Other abnormal blood chemistry   . Elevated blood pressure reading without diagnosis of hypertension   . Personal history of fall   . Arthritis   . Polyneuropathy in other diseases classified elsewhere 02/16/2014   Past Surgical History  Procedure Laterality Date  . Tonsillectomy  1940  . Appendectomy  1945  . External ear surgery  2010    left outer ear basel cell removed  . Abdominal hysterectomy  1972  . Cataract extraction Bilateral    Family History  Problem Relation Age of Onset  . Aortic stenosis Mother   . Heart failure Mother   . Thrombosis Father   . Coronary artery disease Father   . Ataxia Sister   . Ataxia Brother   . Heart disease Brother   .  Cancer Brother     Prostate  . Stroke Brother    History  Substance Use Topics  . Smoking status: Never Smoker   . Smokeless tobacco: Never Used  . Alcohol Use: No   OB History    No data available     Review of Systems  Constitutional: Negative for fever and chills.  HENT: Positive for trouble swallowing.   Respiratory: Negative for chest tightness and shortness of breath.   Gastrointestinal: Positive for  nausea, vomiting and abdominal pain. Negative for diarrhea, constipation, blood in stool and anal bleeding.  Genitourinary: Negative for dysuria and hematuria.  Musculoskeletal: Negative for back pain, neck pain and neck stiffness.  Neurological: Negative for headaches.      Allergies  Amlodipine; Enalapril; Hctz; Penicillins; and Amoxicillin  Home Medications   Prior to Admission medications   Medication Sig Start Date End Date Taking? Authorizing Provider  calcium carbonate (TUMS - DOSED IN MG ELEMENTAL CALCIUM) 500 MG chewable tablet Chew 2 tablets by mouth 5 (five) times daily as needed for indigestion or heartburn.    Yes Historical Provider, MD  furosemide (LASIX) 20 MG tablet Take 20 mg by mouth daily as needed for edema.  07/29/14  Yes Historical Provider, MD  gabapentin (NEURONTIN) 100 MG capsule Take 1 tablet by mouth at breakfast and  2  tablets at bedtime. Patient taking differently: Take 100-200 mg by mouth 2 (two) times daily. Pt takes 1 capsule at breakfast and 2 at bedtime. 10/26/14  Yes Ward Givens, NP  labetalol (NORMODYNE) 100 MG tablet TAKE 1 TABLET TWICE A DAY TO CONTROL BLOOD PRESSURE 11/29/14  Yes Tiffany L Reed, DO  LORazepam (ATIVAN) 0.5 MG tablet TAKE 1 TABLET BY MOUTH EVERY 12 HOURS Patient taking differently: 1 tablet by mouth at bedtime. 06/10/14  Yes Tiffany L Reed, DO  Multiple Vitamins-Minerals (ICAPS) CAPS Take 1 capsule by mouth daily.    Yes Historical Provider, MD  NIFEdipine (PROCARDIA-XL/ADALAT CC) 60 MG 24 hr tablet TAKE 1 TABLET DAILY FOR BLOOD PRESSURE 02/01/14  Yes Estill Dooms, MD  psyllium (METAMUCIL) 58.6 % powder Take 1 packet by mouth daily.    Yes Historical Provider, MD  Ranibizumab (LUCENTIS) 0.3 MG/0.05ML SOLN Inject 1 Dose into the eye every 5 (five) weeks. Pt uses in right eye.   Yes Historical Provider, MD  telmisartan (MICARDIS) 80 MG tablet TAKE 1 TABLET BY MOUTH EVERY DAY FOR BLOOD PRESSURE 11/29/14  Yes Estill Dooms, MD  omeprazole  (PRILOSEC) 10 MG capsule Take one daily for 4 weeks the stop Patient not taking: Reported on 12/12/2014 11/18/14   Man Mast X, NP  omeprazole (PRILOSEC) 20 MG capsule Take one tablet daily for 8 weeks, then start 10mg  tablets Patient not taking: Reported on 12/12/2014 11/18/14   Man Mast X, NP   BP 181/66 mmHg  Pulse 80  Temp(Src) 98.3 F (36.8 C) (Oral)  Resp 16  SpO2 98% Physical Exam  Constitutional: She is oriented to person, place, and time. She appears well-developed and well-nourished. No distress.  HENT:  Head: Normocephalic and atraumatic.  Mouth/Throat: Oropharynx is clear and moist. No oropharyngeal exudate.  Eyes: Conjunctivae and EOM are normal. Pupils are equal, round, and reactive to light. Right eye exhibits no discharge. Left eye exhibits no discharge.  Neck: Normal range of motion. Neck supple. No tracheal deviation present.  Cardiovascular: Normal rate, regular rhythm and normal heart sounds.  Exam reveals no friction rub.   No murmur heard. Pulses:  Radial pulses are 2+ on the right side, and 2+ on the left side.  Pulmonary/Chest: Effort normal and breath sounds normal. No respiratory distress. She has no wheezes. She has no rales. She exhibits no tenderness.  Patient is able to speak in full sentences without difficulty Negative use of accessory muscles Negative stridor  Abdominal: Soft. Bowel sounds are normal. She exhibits no distension. There is tenderness in the epigastric area. There is no rebound and no guarding.  Musculoskeletal: Normal range of motion.  Lymphadenopathy:    She has no cervical adenopathy.  Neurological: She is alert and oriented to person, place, and time. No cranial nerve deficit. She exhibits normal muscle tone. Coordination normal. GCS eye subscore is 4. GCS verbal subscore is 5. GCS motor subscore is 6.  Skin: Skin is warm and dry. No rash noted. She is not diaphoretic. No erythema.  Psychiatric: She has a normal mood and affect. Her  behavior is normal. Thought content normal.  Nursing note and vitals reviewed.   ED Course  Procedures (including critical care time)  Results for orders placed or performed during the hospital encounter of 12/12/14  CBC with Differential/Platelet  Result Value Ref Range   WBC 10.5 4.0 - 10.5 K/uL   RBC 4.86 3.87 - 5.11 MIL/uL   Hemoglobin 15.1 (H) 12.0 - 15.0 g/dL   HCT 42.8 36.0 - 46.0 %   MCV 88.1 78.0 - 100.0 fL   MCH 31.1 26.0 - 34.0 pg   MCHC 35.3 30.0 - 36.0 g/dL   RDW 12.2 11.5 - 15.5 %   Platelets 228 150 - 400 K/uL   Neutrophils Relative % 82 (H) 43 - 77 %   Neutro Abs 8.7 (H) 1.7 - 7.7 K/uL   Lymphocytes Relative 12 12 - 46 %   Lymphs Abs 1.2 0.7 - 4.0 K/uL   Monocytes Relative 5 3 - 12 %   Monocytes Absolute 0.5 0.1 - 1.0 K/uL   Eosinophils Relative 0 0 - 5 %   Eosinophils Absolute 0.0 0.0 - 0.7 K/uL   Basophils Relative 1 0 - 1 %   Basophils Absolute 0.1 0.0 - 0.1 K/uL    Labs Review Labs Reviewed  CBC WITH DIFFERENTIAL/PLATELET - Abnormal; Notable for the following:    Hemoglobin 15.1 (*)    Neutrophils Relative % 82 (*)    Neutro Abs 8.7 (*)    All other components within normal limits  COMPREHENSIVE METABOLIC PANEL  TROPONIN I  LIPASE, BLOOD    Imaging Review Dg Abd Acute W/chest  12/12/2014   CLINICAL DATA:  Dysphagia.  Choking episode this evening with state.  EXAM: ACUTE ABDOMEN SERIES (ABDOMEN 2 VIEW & CHEST 1 VIEW)  COMPARISON:  01/22/2013  FINDINGS: Atherosclerotic aortic arch. Heart size within normal limits. Minimal subsegmental atelectasis or scarring peripherally in the right mid lung.  No free intraperitoneal gas beneath the hemidiaphragms. Degenerative spurring of both acetabula. Small amount of gas noted in the proximal colon with stool in the rectal vault and descending colon. No dilated bowel identified. No significant abnormal air-fluid levels observed, although there is a paucity of gas in the small bowel.  Dextroconvex lumbar scoliosis  with rotary component, degenerative disc disease, and spondylosis.  IMPRESSION: 1. No acute findings. 2. Lumbar spondylosis, scoliosis, and degenerative disc disease. 3. Atherosclerosis. 4. Atelectasis or scarring in the right mid lung.   Electronically Signed   By: Van Clines M.D.   On: 12/12/2014 18:26     EKG  Interpretation   Date/Time:  Sunday December 12 2014 17:28:30 EST Ventricular Rate:  78 PR Interval:  172 QRS Duration: 141 QT Interval:  436 QTC Calculation: 497 R Axis:   -18 Text Interpretation:  Sinus rhythm Left bundle branch block Baseline  wander in lead(s) V3 V5 Confirmed by HARRISON  MD, FORREST ([id number]) on  12/12/2014 5:43:48 PM      5:41 PM This provider administered water to the patient. This provider was at bedside washing the patient swallow. Negative abnormalities identified while the patient swallowed. Patient is able to speak in full sentences without difficulty after swallowing fluids. Patient has been able to keep fluids down for approximately 5 minutes and to hold a conversation. Negative signs of respiratory distress. Negative cyanosis.  8:59 PM Labs are down. Spoke with [place], from lab, regarding labs for Troponin, CMP, and Lipase. Reported that he will look into this and that the results with be tubed up as soon as possible.   MDM   Final diagnoses:  Dysphagia    Medications  sodium chloride 0.9 % bolus 1,000 mL (0 mLs Intravenous Stopped 12/12/14 1952)    Filed Vitals:   12/12/14 1543 12/12/14 1603 12/12/14 1950  BP: 186/76  181/66  Pulse: 70  80  Temp: 98.1 F (36.7 C) 98.1 F (36.7 C) 98.3 F (36.8 C)  TempSrc: Oral  Oral  Resp: 18  16  SpO2: 99%  98%   EKG noted sinus rhythm with left bundle branch block, heart rate 78 bpm. Troponin negative elevation. CBC negative elevated leukocytosis. Hemoglobin 15.1, hematocrit 42.8. CMP unremarkable. Lipase negative elevation. Plain film of acute abdomen no acute abnormalities identified. Suspicion  to be possible food bolus that patient had but resolved on her own after the episode of emesis. Labs reassuring as well as imaging. Negative findings of blood in the emesis to think [employer] Tear or complete tear. Patient seen and assessed by attending physician, Dr. Hampton Abbot, agreed to plan of discharge - recommended GI follow up. Patient able to keep down fluids in ED setting with no episodes of emesis or coughing up the fluids. Patient stable, afebrile. Patient not septic appearing. Discharged patient. Referred patient to PCP. Referred patient to GI-recommended barium swallow versus endoscopy to be done in the future if this continues. Discussed with patient to closely monitor symptoms and if symptoms are to worsen or change to report back to the ED - strict return instructions given.  Patient agreed to plan of care, understood, all questions answered.   Jamse Mead, PA-C 12/12/14 2207  Pamella Pert, MD 12/14/14 [phone]

## 2014-12-12 NOTE — ED Notes (Signed)
Called lab in regards to pt labs pending in process. Lab tech stated that she would re run the labs and send them up. PA made aware.

## 2014-12-12 NOTE — Discharge Instructions (Signed)
Please call your doctor for a followup appointment within 24-48 hours. When you talk to your doctor please let them know that you were seen in the emergency department and have them acquire all of your records so that they can discuss the findings with you and formulate a treatment plan to fully care for your new and ongoing problems. Please follow-up with her primary care provider Please rest and stay hydrated Please avoid any physical or shortness activity Please avoid meats such as steak, chicken for the next couple of days. Please stick with a soft diet such as mashed potatoes, soft carrots, mashed peas, yogurt. Please follow-up with gastroenterology - may need further workup in the near future Please continue to monitor symptoms closely and if symptoms are to worsen or change (fever greater than 101, chills, sweating, nausea, vomiting, chest pain, shortness of breathe, difficulty breathing, weakness, numbness, tingling, worsening or changes to pain pattern, coughing up blood, inability to keep food or fluids down, stomach pain) please report back to the Emergency Department immediately.   Dysphagia Swallowing problems (dysphagia) occur when solids and liquids seem to stick in your throat on the way down to your stomach, or the food takes longer to get to the stomach. Other symptoms include regurgitating food, noises coming from the throat, chest discomfort with swallowing, and a feeling of fullness or the feeling of something being stuck in your throat when swallowing. When blockage in your throat is complete, it may be associated with drooling. CAUSES  Problems with swallowing may occur because of problems with the muscles. The food cannot be propelled in the usual manner into your stomach. You may have ulcers, scar tissue, or inflammation in the tube down which food travels from your mouth to your stomach (esophagus), which blocks food from passing normally into the stomach. Causes of inflammation  include:  Acid reflux from your stomach into your esophagus.  Infection.  Radiation treatment for cancer.  Medicines taken without enough fluids to wash them down into your stomach. You may have nerve problems that prevent signals from being sent to the muscles of your esophagus to contract and move your food down to your stomach. Globus pharyngeus is a relatively common problem in which there is a sense of an obstruction or difficulty in swallowing, without any physical abnormalities of the swallowing passages being found. This problem usually improves over time with reassurance and testing to rule out other causes. DIAGNOSIS Dysphagia can be diagnosed and its cause can be determined by tests in which you swallow a white substance that helps illuminate the inside of your throat (contrast medium) while X-rays are taken. Sometimes a flexible telescope that is inserted down your throat (endoscopy) to look at your esophagus and stomach is used. TREATMENT   If the dysphagia is caused by acid reflux or infection, medicines may be used.  If the dysphagia is caused by problems with your swallowing muscles, swallowing therapy may be used to help you strengthen your swallowing muscles.  If the dysphagia is caused by a blockage or mass, procedures to remove the blockage may be done. HOME CARE INSTRUCTIONS  Try to eat soft food that is easier to swallow and check your weight on a daily basis to be sure that it is not decreasing.  Be sure to drink liquids when sitting upright (not lying down). SEEK MEDICAL CARE IF:  You are losing weight because you are unable to swallow.  You are coughing when you drink liquids (aspiration).  You  are coughing up partially digested food. SEEK IMMEDIATE MEDICAL CARE IF:  You are unable to swallow your own saliva .  You are having shortness of breath or a fever, or both.  You have a hoarse voice along with difficulty swallowing. MAKE SURE YOU:  Understand  these instructions.  Will watch your condition.  Will get help right away if you are not doing well or get worse. Document Released: 09/21/2000 Document Revised: 02/08/2014 Document Reviewed: 03/13/2013 [facility] Patient Information 2015 [place], [place]. This information is not intended to replace advice given to you by your health care provider. Make sure you discuss any questions you have with your health care provider.

## 2014-12-12 NOTE — ED Notes (Addendum)
Pt reports she was eating and started having difficulty swallowing and started coughing. She reports the difficulty swallowing still presents. Denies weakness or headache. She is talking in full sentences.

## 2014-12-16 ENCOUNTER — Non-Acute Institutional Stay: Payer: Medicare Other | Admitting: Nurse Practitioner

## 2014-12-16 ENCOUNTER — Encounter: Payer: Self-pay | Admitting: Nurse Practitioner

## 2014-12-16 VITALS — BP 128/80 | HR 68 | Temp 97.7°F | Wt 151.0 lb

## 2014-12-16 DIAGNOSIS — K219 Gastro-esophageal reflux disease without esophagitis: Secondary | ICD-10-CM | POA: Diagnosis not present

## 2014-12-16 DIAGNOSIS — G629 Polyneuropathy, unspecified: Secondary | ICD-10-CM | POA: Diagnosis not present

## 2014-12-16 DIAGNOSIS — I10 Essential (primary) hypertension: Secondary | ICD-10-CM

## 2014-12-16 DIAGNOSIS — F411 Generalized anxiety disorder: Secondary | ICD-10-CM

## 2014-12-16 DIAGNOSIS — K5900 Constipation, unspecified: Secondary | ICD-10-CM | POA: Diagnosis not present

## 2014-12-16 DIAGNOSIS — G4700 Insomnia, unspecified: Secondary | ICD-10-CM

## 2014-12-16 DIAGNOSIS — R1310 Dysphagia, unspecified: Secondary | ICD-10-CM | POA: Diagnosis not present

## 2014-12-16 DIAGNOSIS — R609 Edema, unspecified: Secondary | ICD-10-CM

## 2014-12-16 NOTE — Assessment & Plan Note (Signed)
Stated she had nerve conduction study. Has off and on prickling and burning sensation, spasm, shooting pain. [facility] referral. Didn't desire any pain management.  02/2014 saw Dr Jannifer Franklin: restless leg and neuropathy.  --no significant change.

## 2014-12-16 NOTE — Assessment & Plan Note (Signed)
Controlled on Nifedipine 60mg , Labetalol 100mg , and Telmisartan 80mg .

## 2014-12-16 NOTE — Assessment & Plan Note (Signed)
Able to reduce Ativan form 0.5mg  bid to 0.25mg  qod.  -- stable.

## 2014-12-16 NOTE — Assessment & Plan Note (Signed)
Stable. Takes Psyllium tid.

## 2014-12-16 NOTE — Progress Notes (Signed)
Patient ID: Jacqueline Dodson, female   DOB: December 05, 1926, 81 y.o.   MRN: [id number]   Code Status: full code  Allergies  Allergen Reactions  . Amlodipine Other (See Comments)    Reaction:  Critically decreased sodium and potassium levels.    . Enalapril Other (See Comments)    Reaction:  Unknown   . Hctz [Hydrochlorothiazide] Other (See Comments)    Reaction:  Unknown   . Penicillins Other (See Comments)    Reaction:  Unknown   . Amoxicillin Swelling and Rash    Chief Complaint  Patient presents with  . Hospitalization Follow-up    went o ER 12/12/14 for dysphagia    HPI: Patient is a 81 y.o. female seen in the clinic at [facility] today for ED eval for dysphagia and other chronic medical conditions.    12/12/14 ED eval for dysphagia-resolved w/o intervention. Unremarkable CXR and labs.  Problem List Items Addressed This Visit    Peripheral neuropathy    Stated she had nerve conduction study. Has off and on prickling and burning sensation, spasm, shooting pain. [facility] referral. Didn't desire any pain management.  02/2014 saw Dr Jannifer Franklin: restless leg and neuropathy.  --no significant change.        Insomnia    Stopped taking Temazepam--then Ativan 0.5mg  bid then 0.25mg  qod-successful dose reduction.  --sleeps well.        HTN (hypertension) (Chronic)    Controlled on Nifedipine 60mg , Labetalol 100mg , and Telmisartan 80mg .        GERD (gastroesophageal reflux disease)    Continue Omeprazole 20mg  daily      Generalized anxiety disorder (Chronic)    Able to reduce Ativan form 0.5mg  bid to 0.25mg  qod.  -- stable.         Edema    New since Gabapentin started May 2015-better since reduced to 100mg  tid(no longer needing Furosemide 20mg  daily prn)       Dysphagia - Primary    Hx of GERD and recently stopped taking Omeprazole-will resume Omeprazole 20mg , purred meat, GI referral.       Constipation    Stable. Takes Psyllium tid.           Review  of Systems:  Review of Systems  Constitutional: Negative for fever, chills and diaphoresis.  HENT: Negative for congestion, ear pain, hearing loss and sore throat.   Eyes: Negative for pain, discharge and redness.  Respiratory: Negative for cough, shortness of breath and wheezing.   Cardiovascular: Positive for leg swelling. Negative for chest pain and palpitations.       BLE R trace  Gastrointestinal: Negative for nausea, vomiting, abdominal pain, diarrhea and constipation.       Bloated since Gabapentin(Omeprazole helped in the past)  Endocrine: Negative for polydipsia.  Genitourinary: Positive for frequency. Negative for dysuria, urgency and flank pain.       Cystocele stage III out side her body now.   Musculoskeletal: Positive for back pain (right SIJ-reoslved. ). Negative for myalgias and neck pain.  Skin: Negative for rash.  Allergic/Immunologic: Negative for environmental allergies.  Neurological: Negative for dizziness, tremors, seizures, weakness and headaches.       Tingling and burning sensation, muscle spasm, shooting pain in BLE on and off.   Hematological: Does not bruise/bleed easily.  Psychiatric/Behavioral: Negative for hallucinations. The patient is nervous/anxious (better managed with Ativan. ).       Past Medical History  Diagnosis Date  . Dermatophytosis of the body   .  Anxiety   . Restless legs syndrome (RLS)   . Primary cerebellar degeneration   . Unspecified hereditary and idiopathic peripheral neuropathy   . Macular degeneration (senile) of retina, unspecified   . Hypertension   . Left bundle branch hemiblock   . Acute, but ill-defined, cerebrovascular disease   . Hemorrhoids   . Orthostatic hypotension   . GERD (gastroesophageal reflux disease)   . Duodenal ulcer, unspecified as acute or chronic, without hemorrhage, perforation, or obstruction   . Unspecified constipation   . Chronic kidney disease, stage II (mild)   . Cystocele, midline   .  Rectocele   . Postmenopausal atrophic vaginitis   . Lumbago   . Myalgia and myositis, unspecified   . Dizziness and giddiness   . Other malaise and fatigue   . Abnormality of gait   . Disturbance of skin sensation   . Edema   . Urgency of urination   . Other abnormal blood chemistry   . Elevated blood pressure reading without diagnosis of hypertension   . Personal history of fall   . Arthritis   . Polyneuropathy in other diseases classified elsewhere 02/16/2014   Past Surgical History  Procedure Laterality Date  . Tonsillectomy  1940  . Appendectomy  1945  . External ear surgery  2010    left outer ear basel cell removed  . Abdominal hysterectomy  1972  . Cataract extraction Bilateral    Social History:   reports that she has never smoked. She has never used smokeless tobacco. She reports that she does not drink alcohol or use illicit drugs.  Family History  Problem Relation Age of Onset  . Aortic stenosis Mother   . Heart failure Mother   . Thrombosis Father   . Coronary artery disease Father   . Ataxia Sister   . Ataxia Brother   . Heart disease Brother   . Cancer Brother     Prostate  . Stroke Brother     Medications: Patient's Medications  New Prescriptions   No medications on file  Previous Medications   CALCIUM CARBONATE (TUMS - DOSED IN MG ELEMENTAL CALCIUM) 500 MG CHEWABLE TABLET    Chew 2 tablets by mouth 5 (five) times daily as needed for indigestion or heartburn.    FUROSEMIDE (LASIX) 20 MG TABLET    Take 20 mg by mouth daily as needed for edema.    GABAPENTIN (NEURONTIN) 100 MG CAPSULE    Take 1 tablet by mouth at breakfast and  2  tablets at bedtime.   LABETALOL (NORMODYNE) 100 MG TABLET    TAKE 1 TABLET TWICE A DAY TO CONTROL BLOOD PRESSURE   LORAZEPAM (ATIVAN) 0.5 MG TABLET    TAKE 1 TABLET BY MOUTH EVERY 12 HOURS   MULTIPLE VITAMINS-MINERALS (ICAPS) CAPS    Take 1 capsule by mouth daily.    NIFEDIPINE (PROCARDIA-XL/ADALAT CC) 60 MG 24 HR TABLET     TAKE 1 TABLET DAILY FOR BLOOD PRESSURE   OMEPRAZOLE (PRILOSEC) 10 MG CAPSULE    Take one daily for 4 weeks the stop   PSYLLIUM (METAMUCIL) 58.6 % POWDER    Take 1 packet by mouth daily.    RANIBIZUMAB (LUCENTIS) 0.3 MG/0.05ML SOLN    Inject 1 Dose into the eye every 5 (five) weeks. Pt uses in right eye.   TELMISARTAN (MICARDIS) 80 MG TABLET    TAKE 1 TABLET BY MOUTH EVERY DAY FOR BLOOD PRESSURE  Modified Medications   No medications on file  Discontinued Medications   OMEPRAZOLE (PRILOSEC) 20 MG CAPSULE    Take one tablet daily for 8 weeks, then start 10mg  tablets     Physical Exam: Physical Exam  Constitutional: She is oriented to person, place, and time. She appears well-developed and well-nourished. No distress.  HENT:  Head: Normocephalic and atraumatic.  Eyes: EOM are normal. Pupils are equal, round, and reactive to light. Right eye exhibits no discharge.  Neck: Normal range of motion. Neck supple. No JVD present. No thyromegaly present.  Cardiovascular: Normal rate, regular rhythm and normal heart sounds.   No murmur heard. Pulmonary/Chest: Effort normal and breath sounds normal. She has no wheezes. She has no rales.  Abdominal: Soft. Bowel sounds are normal. She exhibits no distension. There is no tenderness.  Musculoskeletal: Normal range of motion. She exhibits edema. She exhibits no tenderness (right SIJ, hip, knee tenderness, resolved. ).  RLE trace  Lymphadenopathy:    She has no cervical adenopathy.  Neurological: She is alert and oriented to person, place, and time. She has normal reflexes. No cranial nerve deficit. She exhibits normal muscle tone. Coordination normal.  Skin: Skin is warm and dry. No rash noted. She is not diaphoretic. No erythema.  Psychiatric: She has a normal mood and affect. Her behavior is normal. Judgment and thought content normal.    Filed Vitals:   12/16/14 1640  BP: 128/80  Pulse: 68  Temp: 97.7 F (36.5 C)  TempSrc: Oral  Weight: 151 lb  (68.493 kg)      Labs reviewed: Basic Metabolic Panel:  Recent Labs  12/12/14 1726  NA 141  K 4.2  CL 108  CO2 22  GLUCOSE 130*  BUN 30*  CREATININE 1.46*  CALCIUM 9.9   Liver Function Tests:  Recent Labs  12/12/14 1726  AST 37  ALT 28  ALKPHOS 82  BILITOT 0.9  PROT 8.5*  ALBUMIN 4.8    Recent Labs  12/12/14 1726  LIPASE 25   No results for input(s): AMMONIA in the last 8760 hours. CBC:  Recent Labs  12/12/14 1726  WBC 10.5  NEUTROABS 8.7*  HGB 15.1*  HCT 42.8  MCV 88.1  PLT 228   Lipid Panel: No results for input(s): CHOL, HDL, LDLCALC, TRIG, CHOLHDL, LDLDIRECT in the last 8760 hours. Anemia Panel: No results for input(s): FOLATE, IRON, VITAMINB12 in the last 8760 hours.  Past Procedures:  12/12/14 CXR  IMPRESSION: 1. No acute findings. 2. Lumbar spondylosis, scoliosis, and degenerative disc disease. 3. Atherosclerosis. 4. Atelectasis or scarring in the right mid lung.  Assessment/Plan Dysphagia Hx of GERD and recently stopped taking Omeprazole-will resume Omeprazole 20mg , purred meat, GI referral.    Edema New since Gabapentin started May 2015-better since reduced to 100mg  tid(no longer needing Furosemide 20mg  daily prn)    Peripheral neuropathy Stated she had nerve conduction study. Has off and on prickling and burning sensation, spasm, shooting pain. [facility] referral. Didn't desire any pain management.  02/2014 saw Dr Jannifer Franklin: restless leg and neuropathy.  --no significant change.     Insomnia Stopped taking Temazepam--then Ativan 0.5mg  bid then 0.25mg  qod-successful dose reduction.  --sleeps well.     Constipation Stable. Takes Psyllium tid.     Generalized anxiety disorder Able to reduce Ativan form 0.5mg  bid to 0.25mg  qod.  -- stable.      HTN (hypertension) Controlled on Nifedipine 60mg , Labetalol 100mg , and Telmisartan 80mg .     GERD (gastroesophageal reflux disease) Continue Omeprazole 20mg   daily  Family/ Staff Communication: observe the patient. GI referral.   Goals of Care: [place]  Labs/tests ordered: none

## 2014-12-16 NOTE — Assessment & Plan Note (Signed)
Hx of GERD and recently stopped taking Omeprazole-will resume Omeprazole 20mg , purred meat, GI referral.

## 2014-12-16 NOTE — Assessment & Plan Note (Signed)
New since Gabapentin started May 2015-better since reduced to 100mg  tid(no longer needing Furosemide 20mg  daily prn)

## 2014-12-16 NOTE — Assessment & Plan Note (Signed)
Continue Omeprazole 20mg daily.   

## 2014-12-16 NOTE — Assessment & Plan Note (Signed)
Stopped taking Temazepam--then Ativan 0.5mg  bid then 0.25mg  qod-successful dose reduction.  --sleeps well.

## 2014-12-17 ENCOUNTER — Other Ambulatory Visit: Payer: Self-pay | Admitting: *Deleted

## 2014-12-17 MED ORDER — LORAZEPAM 0.5 MG PO TABS: ORAL_TABLET | ORAL | Status: DC

## 2014-12-17 NOTE — Telephone Encounter (Signed)
Patient requested to be faxed to pharmacy 

## 2014-12-23 ENCOUNTER — Encounter: Payer: Self-pay | Admitting: Internal Medicine

## 2014-12-28 ENCOUNTER — Ambulatory Visit (INDEPENDENT_AMBULATORY_CARE_PROVIDER_SITE_OTHER): Payer: Medicare Other | Admitting: Physician Assistant

## 2014-12-28 ENCOUNTER — Encounter: Payer: Self-pay | Admitting: Physician Assistant

## 2014-12-28 VITALS — BP 144/62 | HR 70 | Ht 65.0 in | Wt 151.6 lb

## 2014-12-28 DIAGNOSIS — R1314 Dysphagia, pharyngoesophageal phase: Secondary | ICD-10-CM | POA: Diagnosis not present

## 2014-12-28 NOTE — Progress Notes (Addendum)
Patient ID: Jacqueline Dodson, female   DOB: 21-Oct-1926, 81 y.o.   MRN: [id number]   Subjective:    Patient ID: Jacqueline Dodson, female    DOB: 1926/12/25, 81 y.o.   MRN: [id number]  HPI Jacqueline Dodson is a pleasant 81 yo female new to GI today, referred by Jacqueline Soda NP/[facility], for evaluation of dysphagia. Patient has not had any prior GI evaluation. She has history of hypertension, peripheral neuropathy anxiety and GERD. She states she had started taking omeprazole a few months ago for heartburn and that was working well but she stopped it because the package insert red that she shouldn't stay on it more than a couple of weeks. She says after she stopped the omeprazole she had an episode of dysphagia. She feels as if the food stopped and she had to stop eating for several minutes until at one on down. She says this happened again on 12/11/2013 while she was eating steak. Says it was associated with some coughing and then several hours later she brought up a small piece of meat. She has since gone back on the omeprazole. She says that she has not had any episodes since that time. She has changed her diet somewhat and has been eating very soft foods. Eating chopped meats only. Prior to those 2 episodes she had very sporadically had a mild episode of dysphagia over the past couple of years but nothing on a regular basis. Patient says that she was having some nocturnal reflux symptoms originally for which she started the omeprazole and that is well controlled.  Review of Systems Pertinent positive and negative review of systems were noted in the above HPI section.  All other review of systems was otherwise negative.  Outpatient Encounter Prescriptions as of 12/28/2014  Medication Sig  . calcium carbonate (TUMS - DOSED IN MG ELEMENTAL CALCIUM) 500 MG chewable tablet Chew 2 tablets by mouth 5 (five) times daily as needed for indigestion or heartburn.   . furosemide (LASIX) 20 MG tablet Take 20 mg by mouth daily as  needed for edema.   . gabapentin (NEURONTIN) 100 MG capsule Take 1 tablet by mouth at breakfast and  2  tablets at bedtime. (Patient taking differently: Take 100-200 mg by mouth 2 (two) times daily. Pt takes 1 capsule at breakfast and 2 at bedtime.)  . labetalol (NORMODYNE) 100 MG tablet TAKE 1 TABLET TWICE A DAY TO CONTROL BLOOD PRESSURE  . LORazepam (ATIVAN) 0.5 MG tablet Take one tablet by mouth at bedtime as needed for rest  . Multiple Vitamins-Minerals (ICAPS) CAPS Take 1 capsule by mouth daily.   Marland Kitchen NIFEdipine (PROCARDIA-XL/ADALAT CC) 60 MG 24 hr tablet TAKE 1 TABLET DAILY FOR BLOOD PRESSURE  . omeprazole (PRILOSEC OTC) 20 MG tablet Take 20 mg by mouth daily.  . psyllium (METAMUCIL) 58.6 % powder Take 1 packet by mouth daily.   . Ranibizumab (LUCENTIS) 0.3 MG/0.05ML SOLN Inject 1 Dose into the eye every 5 (five) weeks. Pt uses in right eye.  . telmisartan (MICARDIS) 80 MG tablet TAKE 1 TABLET BY MOUTH EVERY DAY FOR BLOOD PRESSURE  . [DISCONTINUED] omeprazole (PRILOSEC) 10 MG capsule Take one daily for 4 weeks the stop (Patient not taking: Reported on 12/12/2014)   Allergies  Allergen Reactions  . Amlodipine Other (See Comments)    Reaction:  Critically decreased sodium and potassium levels.    . Enalapril Other (See Comments)    Reaction:  Unknown   . Hctz [Hydrochlorothiazide] Other (See Comments)  Reaction:  Unknown   . Penicillins Other (See Comments)    Reaction:  Unknown   . Amoxicillin Swelling and Rash   Patient Active Problem List   Diagnosis Date Noted  . Dysphagia 12/16/2014  . GERD (gastroesophageal reflux disease) 09/09/2014  . Edema 09/09/2014  . Polyneuropathy in other diseases classified elsewhere 02/16/2014  . Abnormality of gait 02/16/2014  . Restless legs syndrome (RLS) 02/16/2014  . Constipation 12/20/2013  . Peripheral neuropathy 12/20/2013  . Rectal mucosa prolapse 04/13/2013  . Osteoarthritis of right knee 01/15/2013  . Osteoarthritis of right hip  01/15/2013  . HTN (hypertension) 01/15/2013  . Insomnia 01/15/2013  . Generalized anxiety disorder 01/15/2013  . Bladder cystocele 01/15/2013   History   Social History  . Marital Status: Widowed    Spouse Name: N/A  . Number of Children: 5  . Years of Education: college   Occupational History  . Retired    Social History Main Topics  . Smoking status: Never Smoker   . Smokeless tobacco: Never Used  . Alcohol Use: No  . Drug Use: No  . Sexual Activity: No   Other Topics Concern  . Not on file   Social History Narrative   Patient is widowed, with 5 children.   Patient is right handed.   Patient has college education.   Patient does not drink caffeine.    [name] family history includes Aortic stenosis in her mother; Ataxia in her brother and sister; Cancer in her brother; Coronary artery disease in her father; Heart disease in her brother; Heart failure in her mother; Stroke in her brother; Thrombosis in her father.      Objective:    Filed Vitals:   12/28/14 0921  BP: 144/62  Pulse: 70    Physical Exam   well-developed elderly white female in no acute distress, accompanied by her daughter patient ambulates with a walker, blood pressure 144/62 pulse 70 height 5 foot 5 weight 151. HEENT; nontraumatic normocephalic EOMI PERRLA sclera anicteric, Supple; no JVD, Cardiovascular ;regular rate and rhythm with S1-S2, Pulmonary; clear bilaterally, Abdomen; soft nontender nondistended bowel sounds are active there is no palpable mass or hepatosplenomegaly, Extremities ;no clubbing cyanosis or edema skin warm and dry, Psych; mood and affect appropriate       Assessment & Plan:   #1 81 yo female with 2 recent episodes of dysphagia,symptoms are intermittent. R/O motility disorder vs stricture. #2 HTN #3 Anxiety #4 GERD #5 peripheral neuropathy  Plan;Due to advanced age will pursue Ba swallow first and if stricture found the will need EGD with Dilation with Dr.  Hilarie Dodson. Continue Omeprazole 20 mg po daily Pt advised not to change diet until we review Ba Swallow    Jacqueline Dodson S Jacqueline Jian PA-C 12/28/2014   Cc: Jacqueline Dodson, Jacqueline X, NP  Addendum: Reviewed and agree with initial management. Jerene Bears, MD

## 2014-12-28 NOTE — Patient Instructions (Signed)
You have been scheduled for a Barium Esophogram at [facility] (1st floor of the hospital) on Monday 01-03-2015 at 11:00 am  Please arrive at 10:45 am prior to your appointment for registration. Make certain not to have anything to eat or drink 4 hours prior to your test. If you need to reschedule for any reason, please contact radiology at [phone] to do so.  Continue Omeprazole 20 mg , 1 tab daily. __________________________________________________________________ A barium swallow is an examination that concentrates on views of the esophagus. This tends to be a double contrast exam (barium and two liquids which, when combined, create a gas to distend the wall of the oesophagus) or single contrast (non-ionic iodine based). The study is usually tailored to your symptoms so a good history is essential. Attention is paid during the study to the form, structure and configuration of the esophagus, looking for functional disorders (such as aspiration, dysphagia, achalasia, motility and reflux) EXAMINATION You may be asked to change into a gown, depending on the type of swallow being performed. A radiologist and radiographer will perform the procedure. The radiologist will advise you of the type of contrast selected for your procedure and direct you during the exam. You will be asked to stand, sit or lie in several different positions and to hold a small amount of fluid in your mouth before being asked to swallow while the imaging is performed .In some instances you may be asked to swallow barium coated marshmallows to assess the motility of a solid food bolus. The exam can be recorded as a digital or video fluoroscopy procedure. POST PROCEDURE It will take 1-2 days for the barium to pass through your system. To facilitate this, it is important, unless otherwise directed, to increase your fluids for the next 24-48hrs and to resume your normal diet.  This test typically takes about 30 minutes to  perform. __________________________________________________________________________________

## 2014-12-29 DIAGNOSIS — H3532 Exudative age-related macular degeneration: Secondary | ICD-10-CM | POA: Diagnosis not present

## 2015-01-03 ENCOUNTER — Ambulatory Visit (HOSPITAL_COMMUNITY)
Admission: RE | Admit: 2015-01-03 | Discharge: 2015-01-03 | Disposition: A | Discharge status: Home / Self Care | Payer: Medicare Other | Source: Ambulatory Visit | Attending: Physician Assistant | Admitting: Physician Assistant

## 2015-01-03 ENCOUNTER — Encounter: Payer: Self-pay | Admitting: Internal Medicine

## 2015-01-03 DIAGNOSIS — K449 Diaphragmatic hernia without obstruction or gangrene: Secondary | ICD-10-CM | POA: Diagnosis not present

## 2015-01-03 DIAGNOSIS — R1314 Dysphagia, pharyngoesophageal phase: Secondary | ICD-10-CM

## 2015-01-03 DIAGNOSIS — K219 Gastro-esophageal reflux disease without esophagitis: Secondary | ICD-10-CM | POA: Diagnosis not present

## 2015-01-03 DIAGNOSIS — R1310 Dysphagia, unspecified: Secondary | ICD-10-CM | POA: Diagnosis not present

## 2015-01-03 DIAGNOSIS — K224 Dyskinesia of esophagus: Secondary | ICD-10-CM | POA: Insufficient documentation

## 2015-01-06 ENCOUNTER — Encounter: Payer: Self-pay | Admitting: Internal Medicine

## 2015-01-24 ENCOUNTER — Other Ambulatory Visit: Payer: Self-pay | Admitting: Internal Medicine

## 2015-02-13 ENCOUNTER — Other Ambulatory Visit: Payer: Self-pay | Admitting: Adult Health

## 2015-02-16 DIAGNOSIS — H3532 Exudative age-related macular degeneration: Secondary | ICD-10-CM | POA: Diagnosis not present

## 2015-03-14 ENCOUNTER — Telehealth: Payer: Self-pay

## 2015-03-14 NOTE — Telephone Encounter (Signed)
Jacqueline Dodson has had dizzy for couple of weeks, schedule appt with [facility] 03/30/15, bp 112/60. Asked if she could have lab. Get BMP I10 on Tues 03/15/15.

## 2015-03-15 DIAGNOSIS — I10 Essential (primary) hypertension: Secondary | ICD-10-CM | POA: Diagnosis not present

## 2015-03-15 LAB — BASIC METABOLIC PANEL
BUN: 29 mg/dL — AB (ref 4–21)
CREATININE: 1.3 mg/dL — AB (ref 0.5–1.1)
Glucose: 101 mg/dL
Potassium: 4.2 mmol/L (ref 3.4–5.3)
Sodium: 139 mmol/L (ref 137–147)

## 2015-03-18 ENCOUNTER — Other Ambulatory Visit: Payer: Self-pay | Admitting: Internal Medicine

## 2015-03-18 NOTE — Telephone Encounter (Signed)
Patient called asking about her Rx for Lorazepam, had called the pharmacy Wednesday, they haven't heard back. Told patient I would take care of it. To [facility] [employer] [employer]. [employer]

## 2015-03-24 ENCOUNTER — Encounter: Payer: Self-pay | Admitting: Nurse Practitioner

## 2015-03-24 ENCOUNTER — Non-Acute Institutional Stay: Payer: Medicare Other | Admitting: Nurse Practitioner

## 2015-03-24 VITALS — BP 136/66 | HR 69 | Temp 98.0°F | Wt 149.0 lb

## 2015-03-24 DIAGNOSIS — R42 Dizziness and giddiness: Secondary | ICD-10-CM

## 2015-03-24 DIAGNOSIS — K219 Gastro-esophageal reflux disease without esophagitis: Secondary | ICD-10-CM

## 2015-03-24 DIAGNOSIS — G4700 Insomnia, unspecified: Secondary | ICD-10-CM | POA: Diagnosis not present

## 2015-03-24 DIAGNOSIS — F411 Generalized anxiety disorder: Secondary | ICD-10-CM

## 2015-03-24 DIAGNOSIS — G63 Polyneuropathy in diseases classified elsewhere: Secondary | ICD-10-CM | POA: Diagnosis not present

## 2015-03-24 DIAGNOSIS — I10 Essential (primary) hypertension: Secondary | ICD-10-CM

## 2015-03-24 DIAGNOSIS — R609 Edema, unspecified: Secondary | ICD-10-CM

## 2015-03-24 DIAGNOSIS — G629 Polyneuropathy, unspecified: Secondary | ICD-10-CM

## 2015-03-24 DIAGNOSIS — K5900 Constipation, unspecified: Secondary | ICD-10-CM

## 2015-03-24 DIAGNOSIS — R1310 Dysphagia, unspecified: Secondary | ICD-10-CM

## 2015-03-27 DIAGNOSIS — R42 Dizziness and giddiness: Secondary | ICD-10-CM | POA: Insufficient documentation

## 2015-03-27 NOTE — Assessment & Plan Note (Signed)
Controlled on Nifedipine 60mg , Labetalol 100mg , and Telmisartan 80mg .

## 2015-03-27 NOTE — Assessment & Plan Note (Signed)
Stable. Takes Psyllium tid.

## 2015-03-27 NOTE — Assessment & Plan Note (Signed)
Trace edema on and off. Takes prn Furosemide only a few times.

## 2015-03-27 NOTE — Assessment & Plan Note (Signed)
No focal neurological deficit, not positional, will decrease Neurontin to 100mg  bid to eliminate possible side effects. May consider carotid doppler and CT head to evaluate further is no better.

## 2015-03-27 NOTE — Assessment & Plan Note (Signed)
Stated she had nerve conduction study. Has off and on prickling and burning sensation, spasm, shooting pain. [facility] referral. Didn't desire any pain management.  02/2014 saw Dr Jannifer Franklin: restless leg and neuropathy.  --no significant change.  --Gabapentin 100mg  bid.

## 2015-03-27 NOTE — Progress Notes (Signed)
Patient ID: Jacqueline Dodson, female   DOB: 1927-09-10, 81 y.o.   MRN: [id number]   Code Status: full code  Allergies  Allergen Reactions  . Amlodipine Other (See Comments)    Reaction:  Critically decreased sodium and potassium levels.    . Enalapril Other (See Comments)    Reaction:  Unknown   . Hctz [Hydrochlorothiazide] Other (See Comments)    Reaction:  Unknown   . Penicillins Other (See Comments)    Reaction:  Unknown   . Amoxicillin Swelling and Rash    Chief Complaint  Patient presents with  . Weakness    and light headness since March. BP was 112/60 the other day    HPI: Patient is a 81 y.o. female seen in the clinic at [facility] today for chronic medical conditions.    12/12/14 ED eval for dysphagia-resolved w/o intervention. Unremarkable CXR and labs.  Problem List Items Addressed This Visit    HTN (hypertension) (Chronic)    Controlled on Nifedipine 60mg , Labetalol 100mg , and Telmisartan 80mg .         Generalized anxiety disorder (Chronic)    Able to reduce Ativan form 0.5mg  bid to 0.25mg  qod.  -- stable.         Polyneuropathy in other diseases classified elsewhere (Chronic)    Stated she had nerve conduction study. Has off and on prickling and burning sensation, spasm, shooting pain. [facility] referral. Didn't desire any pain management.  02/2014 saw Dr Jannifer Franklin: restless leg and neuropathy.  --no significant change.  --Gabapentin 100mg  bid.         Insomnia    Stopped taking Temazepam--then Ativan 0.5mg  bid then 0.25mg  qod-successful dose reduction.  --sleeps well.        Constipation    Stable. Takes Psyllium tid.         Peripheral neuropathy    Stated she had nerve conduction study. Has off and on prickling and burning sensation, spasm, shooting pain. [facility] referral. Didn't desire any pain management.  02/2014 saw Dr Jannifer Franklin: restless leg and neuropathy.  --no significant change.  --decrease Gabapentin to 100mg  bid           GERD (gastroesophageal reflux disease)    Continue Omeprazole 20mg  daily       Edema    Trace edema on and off. Takes prn Furosemide only a few times.       Dysphagia    Hx of GERD, better since resumed Omeprazole 20mg        Lightheadedness - Primary    No focal neurological deficit, not positional, will decrease Neurontin to 100mg  bid to eliminate possible side effects. May consider carotid doppler and CT head to evaluate further is no better.          Review of Systems:  Review of Systems  Constitutional: Negative for fever, chills and diaphoresis.  HENT: Negative for congestion, ear pain, hearing loss and sore throat.   Eyes: Negative for pain, discharge and redness.  Respiratory: Negative for cough, shortness of breath and wheezing.   Cardiovascular: Positive for leg swelling. Negative for chest pain and palpitations.       BLE R trace  Gastrointestinal: Negative for nausea, vomiting, abdominal pain, diarrhea and constipation.       Bloated since Gabapentin(Omeprazole helped in the past)  Endocrine: Negative for polydipsia.  Genitourinary: Positive for frequency. Negative for dysuria, urgency and flank pain.       Cystocele stage III out side her body now.  Musculoskeletal: Positive for back pain (right SIJ-reoslved. ). Negative for myalgias and neck pain.  Skin: Negative for rash.  Allergic/Immunologic: Negative for environmental allergies.  Neurological: Negative for dizziness, tremors, seizures, weakness and headaches.       Tingling and burning sensation, muscle spasm, shooting pain in BLE on and off.   Hematological: Does not bruise/bleed easily.  Psychiatric/Behavioral: Negative for hallucinations. The patient is nervous/anxious (better managed with Ativan. ).       Past Medical History  Diagnosis Date  . Dermatophytosis of the body   . Anxiety   . Restless legs syndrome (RLS)   . Primary cerebellar degeneration   . Unspecified hereditary and  idiopathic peripheral neuropathy   . Macular degeneration (senile) of retina, unspecified   . Hypertension   . Left bundle branch hemiblock   . Acute, but ill-defined, cerebrovascular disease   . Hemorrhoids   . Orthostatic hypotension   . GERD (gastroesophageal reflux disease)   . Duodenal ulcer, unspecified as acute or chronic, without hemorrhage, perforation, or obstruction   . Unspecified constipation   . Chronic kidney disease, stage II (mild)   . Cystocele, midline   . Rectocele   . Postmenopausal atrophic vaginitis   . Lumbago   . Myalgia and myositis, unspecified   . Dizziness and giddiness   . Other malaise and fatigue   . Abnormality of gait   . Disturbance of skin sensation   . Edema   . Urgency of urination   . Other abnormal blood chemistry   . Elevated blood pressure reading without diagnosis of hypertension   . Personal history of fall   . Arthritis   . Polyneuropathy in other diseases classified elsewhere 02/16/2014   Past Surgical History  Procedure Laterality Date  . Tonsillectomy  1940  . Appendectomy  1945  . External ear surgery  2010    left outer ear basel cell removed  . Abdominal hysterectomy  1972  . Cataract extraction Bilateral    Social History:   reports that she has never smoked. She has never used smokeless tobacco. She reports that she does not drink alcohol or use illicit drugs.  Family History  Problem Relation Age of Onset  . Aortic stenosis Mother   . Heart failure Mother   . Thrombosis Father   . Coronary artery disease Father   . Ataxia Sister   . Ataxia Brother   . Heart disease Brother   . Cancer Brother     Prostate  . Stroke Brother     Medications: Patient's Medications  New Prescriptions   No medications on file  Previous Medications   CALCIUM CARBONATE (TUMS - DOSED IN MG ELEMENTAL CALCIUM) 500 MG CHEWABLE TABLET    Chew 1 tablet by mouth. Take one after each meal and at bedtime for indigestion or heartburn    FUROSEMIDE (LASIX) 20 MG TABLET    Take 20 mg by mouth daily as needed for edema.    GABAPENTIN (NEURONTIN) 100 MG CAPSULE    Take 1 tablet by mouth at breakfast and  2  tablets at bedtime.   LABETALOL (NORMODYNE) 100 MG TABLET    TAKE 1 TABLET TWICE A DAY TO CONTROL BLOOD PRESSURE   LORAZEPAM (ATIVAN) 0.5 MG TABLET    TAKE 1 TABLET BY MOUTH AT BEDTIME AS NEEDED FOR REST   MULTIPLE VITAMINS-MINERALS (ICAPS) CAPS    Take 1 capsule by mouth daily.    NIFEDIPINE (PROCARDIA-XL/ADALAT CC) 60 MG 24 HR TABLET  TAKE 1 TABLET DAILY FOR BLOOD PRESSURE   OMEPRAZOLE (PRILOSEC OTC) 20 MG TABLET    Take 20 mg by mouth daily.   PSYLLIUM (METAMUCIL) 58.6 % POWDER    Take 1 packet by mouth daily.    RANIBIZUMAB (LUCENTIS) 0.3 MG/0.05ML SOLN    Inject 1 Dose into the eye every 5 (five) weeks. Pt uses in right eye.   TELMISARTAN (MICARDIS) 80 MG TABLET    TAKE 1 TABLET BY MOUTH EVERY DAY FOR BLOOD PRESSURE  Modified Medications   No medications on file  Discontinued Medications   GABAPENTIN (NEURONTIN) 100 MG CAPSULE    Take 1 tablet by mouth at breakfast and 2 tablets at bedtime.     Physical Exam: Physical Exam  Constitutional: She is oriented to person, place, and time. She appears well-developed and well-nourished. No distress.  HENT:  Head: Normocephalic and atraumatic.  Eyes: EOM are normal. Pupils are equal, round, and reactive to light. Right eye exhibits no discharge.  Neck: Normal range of motion. Neck supple. No JVD present. No thyromegaly present.  Cardiovascular: Normal rate, regular rhythm and normal heart sounds.   No murmur heard. Pulmonary/Chest: Effort normal and breath sounds normal. She has no wheezes. She has no rales.  Abdominal: Soft. Bowel sounds are normal. She exhibits no distension. There is no tenderness.  Musculoskeletal: Normal range of motion. She exhibits edema. She exhibits no tenderness (right SIJ, hip, knee tenderness, resolved. ).  RLE trace  Lymphadenopathy:    She  has no cervical adenopathy.  Neurological: She is alert and oriented to person, place, and time. She has normal reflexes. No cranial nerve deficit. She exhibits normal muscle tone. Coordination normal.  Skin: Skin is warm and dry. No rash noted. She is not diaphoretic. No erythema.  Psychiatric: She has a normal mood and affect. Her behavior is normal. Judgment and thought content normal.    Filed Vitals:   03/24/15 1552  BP: 136/66  Pulse: 69  Temp: 98 F (36.7 C)  TempSrc: Oral  Weight: 149 lb (67.586 kg)  SpO2: 98%      Labs reviewed: Basic Metabolic Panel:  Recent Labs  12/12/14 1726 03/15/15  NA 141 139  K 4.2 4.2  CL 108  --   CO2 22  --   GLUCOSE 130*  --   BUN 30* 29*  CREATININE 1.46* 1.3*  CALCIUM 9.9  --    Liver Function Tests:  Recent Labs  12/12/14 1726  AST 37  ALT 28  ALKPHOS 82  BILITOT 0.9  PROT 8.5*  ALBUMIN 4.8    Recent Labs  12/12/14 1726  LIPASE 25   No results for input(s): AMMONIA in the last 8760 hours. CBC:  Recent Labs  12/12/14 1726  WBC 10.5  NEUTROABS 8.7*  HGB 15.1*  HCT 42.8  MCV 88.1  PLT 228   Lipid Panel: No results for input(s): CHOL, HDL, LDLCALC, TRIG, CHOLHDL, LDLDIRECT in the last 8760 hours. Anemia Panel: No results for input(s): FOLATE, IRON, VITAMINB12 in the last 8760 hours.  Past Procedures:  12/12/14 CXR  IMPRESSION: 1. No acute findings. 2. Lumbar spondylosis, scoliosis, and degenerative disc disease. 3. Atherosclerosis. 4. Atelectasis or scarring in the right mid lung.  Assessment/Plan Lightheadedness No focal neurological deficit, not positional, will decrease Neurontin to 100mg  bid to eliminate possible side effects. May consider carotid doppler and CT head to evaluate further is no better.   Edema Trace edema on and off. Takes prn Furosemide only  a few times.   GERD (gastroesophageal reflux disease) Continue Omeprazole 20mg  daily   Constipation Stable. Takes Psyllium tid.      HTN (hypertension) Controlled on Nifedipine 60mg , Labetalol 100mg , and Telmisartan 80mg .     Polyneuropathy in other diseases classified elsewhere Stated she had nerve conduction study. Has off and on prickling and burning sensation, spasm, shooting pain. [facility] referral. Didn't desire any pain management.  02/2014 saw Dr Jannifer Franklin: restless leg and neuropathy.  --no significant change.  --Gabapentin 100mg  bid.     Generalized anxiety disorder Able to reduce Ativan form 0.5mg  bid to 0.25mg  qod.  -- stable.     Insomnia Stopped taking Temazepam--then Ativan 0.5mg  bid then 0.25mg  qod-successful dose reduction.  --sleeps well.    Peripheral neuropathy Stated she had nerve conduction study. Has off and on prickling and burning sensation, spasm, shooting pain. [facility] referral. Didn't desire any pain management.  02/2014 saw Dr Jannifer Franklin: restless leg and neuropathy.  --no significant change.  --decrease Gabapentin to 100mg  bid     Dysphagia Hx of GERD, better since resumed Omeprazole 20mg      Family/ Staff Communication: observe the patient.   Goals of Care: IL  Labs/tests ordered: none

## 2015-03-27 NOTE — Assessment & Plan Note (Signed)
Continue Omeprazole 20mg daily.   

## 2015-03-28 NOTE — Assessment & Plan Note (Signed)
Stopped taking Temazepam--then Ativan 0.5mg  bid then 0.25mg  qod-successful dose reduction.  --sleeps well.

## 2015-03-28 NOTE — Assessment & Plan Note (Signed)
Stated she had nerve conduction study. Has off and on prickling and burning sensation, spasm, shooting pain. [facility] referral. Didn't desire any pain management.  02/2014 saw Dr Jannifer Franklin: restless leg and neuropathy.  --no significant change.  --decrease Gabapentin to 100mg  bid

## 2015-03-28 NOTE — Assessment & Plan Note (Signed)
Able to reduce Ativan form 0.5mg  bid to 0.25mg  qod.  -- stable.

## 2015-03-28 NOTE — Assessment & Plan Note (Signed)
Hx of GERD, better since resumed Omeprazole 20mg 

## 2015-03-29 DIAGNOSIS — H40013 Open angle with borderline findings, low risk, bilateral: Secondary | ICD-10-CM | POA: Diagnosis not present

## 2015-03-29 DIAGNOSIS — H35361 Drusen (degenerative) of macula, right eye: Secondary | ICD-10-CM | POA: Diagnosis not present

## 2015-03-29 DIAGNOSIS — H3532 Exudative age-related macular degeneration: Secondary | ICD-10-CM | POA: Diagnosis not present

## 2015-03-29 DIAGNOSIS — H31012 Macula scars of posterior pole (postinflammatory) (post-traumatic), left eye: Secondary | ICD-10-CM | POA: Diagnosis not present

## 2015-03-29 DIAGNOSIS — H3531 Nonexudative age-related macular degeneration: Secondary | ICD-10-CM | POA: Diagnosis not present

## 2015-03-29 DIAGNOSIS — Z961 Presence of intraocular lens: Secondary | ICD-10-CM | POA: Diagnosis not present

## 2015-04-05 DIAGNOSIS — R29898 Other symptoms and signs involving the musculoskeletal system: Secondary | ICD-10-CM | POA: Diagnosis not present

## 2015-04-05 DIAGNOSIS — R29818 Other symptoms and signs involving the nervous system: Secondary | ICD-10-CM | POA: Diagnosis not present

## 2015-04-07 DIAGNOSIS — R29898 Other symptoms and signs involving the musculoskeletal system: Secondary | ICD-10-CM | POA: Diagnosis not present

## 2015-04-07 DIAGNOSIS — R29818 Other symptoms and signs involving the nervous system: Secondary | ICD-10-CM | POA: Diagnosis not present

## 2015-04-12 DIAGNOSIS — N3946 Mixed incontinence: Secondary | ICD-10-CM | POA: Diagnosis not present

## 2015-04-12 DIAGNOSIS — R29818 Other symptoms and signs involving the nervous system: Secondary | ICD-10-CM | POA: Diagnosis not present

## 2015-04-14 DIAGNOSIS — H3532 Exudative age-related macular degeneration: Secondary | ICD-10-CM | POA: Diagnosis not present

## 2015-04-18 DIAGNOSIS — N3946 Mixed incontinence: Secondary | ICD-10-CM | POA: Diagnosis not present

## 2015-04-18 DIAGNOSIS — R29818 Other symptoms and signs involving the nervous system: Secondary | ICD-10-CM | POA: Diagnosis not present

## 2015-04-19 DIAGNOSIS — N3946 Mixed incontinence: Secondary | ICD-10-CM | POA: Diagnosis not present

## 2015-04-19 DIAGNOSIS — R29818 Other symptoms and signs involving the nervous system: Secondary | ICD-10-CM | POA: Diagnosis not present

## 2015-04-25 DIAGNOSIS — R29818 Other symptoms and signs involving the nervous system: Secondary | ICD-10-CM | POA: Diagnosis not present

## 2015-04-25 DIAGNOSIS — N3946 Mixed incontinence: Secondary | ICD-10-CM | POA: Diagnosis not present

## 2015-04-28 DIAGNOSIS — R29818 Other symptoms and signs involving the nervous system: Secondary | ICD-10-CM | POA: Diagnosis not present

## 2015-04-28 DIAGNOSIS — N3946 Mixed incontinence: Secondary | ICD-10-CM | POA: Diagnosis not present

## 2015-05-02 DIAGNOSIS — R29818 Other symptoms and signs involving the nervous system: Secondary | ICD-10-CM | POA: Diagnosis not present

## 2015-05-02 DIAGNOSIS — N3946 Mixed incontinence: Secondary | ICD-10-CM | POA: Diagnosis not present

## 2015-05-05 DIAGNOSIS — R29818 Other symptoms and signs involving the nervous system: Secondary | ICD-10-CM | POA: Diagnosis not present

## 2015-05-05 DIAGNOSIS — N3946 Mixed incontinence: Secondary | ICD-10-CM | POA: Diagnosis not present

## 2015-05-06 ENCOUNTER — Other Ambulatory Visit: Payer: Self-pay | Admitting: Adult Health

## 2015-05-10 ENCOUNTER — Ambulatory Visit (INDEPENDENT_AMBULATORY_CARE_PROVIDER_SITE_OTHER): Payer: Medicare Other | Admitting: Neurology

## 2015-05-10 ENCOUNTER — Encounter: Payer: Self-pay | Admitting: Neurology

## 2015-05-10 VITALS — BP 116/57 | HR 63 | Ht 65.0 in | Wt 150.4 lb

## 2015-05-10 DIAGNOSIS — G63 Polyneuropathy in diseases classified elsewhere: Secondary | ICD-10-CM

## 2015-05-10 DIAGNOSIS — G2581 Restless legs syndrome: Secondary | ICD-10-CM | POA: Diagnosis not present

## 2015-05-10 DIAGNOSIS — R269 Unspecified abnormalities of gait and mobility: Secondary | ICD-10-CM

## 2015-05-10 NOTE — Progress Notes (Signed)
Reason for visit: Peripheral neuropathy  Jacqueline Dodson is an 81 y.o. female  History of present illness:  Jacqueline Dodson is an 81 year old right-handed white female with a history of a peripheral neuropathy associated with a gait disorder. The patient has dysesthesias involving the feet and lower legs that is present throughout the day, and at nighttime. She is very sensitive to medications, she has been unable to tolerate doses of gabapentin higher than 100 mg twice daily. When she took 200 mg at night, she was getting dizziness and ankle swelling. The patient has not had any falls, she uses a walker for ambulation at all times. She was seen in the emergency room for problems with choking, she was found to have mild reflux problems, she currently is on omeprazole. The patient indicates that most nights that she will sleep fairly well. She does report some restless leg syndrome symptoms.  Past Medical History  Diagnosis Date  . Dermatophytosis of the body   . Anxiety   . Restless legs syndrome (RLS)   . Primary cerebellar degeneration   . Unspecified hereditary and idiopathic peripheral neuropathy   . Macular degeneration (senile) of retina, unspecified   . Hypertension   . Left bundle branch hemiblock   . Acute, but ill-defined, cerebrovascular disease   . Hemorrhoids   . Orthostatic hypotension   . GERD (gastroesophageal reflux disease)   . Duodenal ulcer, unspecified as acute or chronic, without hemorrhage, perforation, or obstruction   . Unspecified constipation   . Chronic kidney disease, stage II (mild)   . Cystocele, midline   . Rectocele   . Postmenopausal atrophic vaginitis   . Lumbago   . Myalgia and myositis, unspecified   . Dizziness and giddiness   . Other malaise and fatigue   . Abnormality of gait   . Disturbance of skin sensation   . Edema   . Urgency of urination   . Other abnormal blood chemistry   . Elevated blood pressure reading without diagnosis of hypertension    . Personal history of fall   . Arthritis   . Polyneuropathy in other diseases classified elsewhere 02/16/2014    Past Surgical History  Procedure Laterality Date  . Tonsillectomy  1940  . Appendectomy  1945  . External ear surgery  2010    left outer ear basel cell removed  . Abdominal hysterectomy  1972  . Cataract extraction Bilateral     Family History  Problem Relation Age of Onset  . Aortic stenosis Mother   . Heart failure Mother   . Thrombosis Father   . Coronary artery disease Father   . Ataxia Sister   . Ataxia Brother   . Heart disease Brother   . Cancer Brother     Prostate  . Stroke Brother     Social history:  reports that she has never smoked. She has never used smokeless tobacco. She reports that she does not drink alcohol or use illicit drugs.    Allergies  Allergen Reactions  . Amlodipine Other (See Comments)    Reaction:  Critically decreased sodium and potassium levels.    . Enalapril Other (See Comments)    Reaction:  Unknown   . Hctz [Hydrochlorothiazide] Other (See Comments)    Reaction:  Unknown   . Penicillins Other (See Comments)    Reaction:  Unknown   . Amoxicillin Swelling and Rash    Medications:  Prior to Admission medications   Medication Sig Start Date End  Date Taking? Authorizing Provider  calcium carbonate (TUMS - DOSED IN MG ELEMENTAL CALCIUM) 500 MG chewable tablet Chew 1 tablet by mouth. Take one after each meal and at bedtime for indigestion or heartburn   Yes Historical Provider, MD  furosemide (LASIX) 20 MG tablet Take 20 mg by mouth daily as needed for edema.  07/29/14  Yes Historical Provider, MD  gabapentin (NEURONTIN) 100 MG capsule Take 1 tablet by mouth at breakfast and  2  tablets at bedtime. Patient taking differently: Take 100-200 mg by mouth 2 (two) times daily. Pt takes 1 capsule at breakfast and 1 at bedtime. 10/26/14  Yes Ward Givens, NP  labetalol (NORMODYNE) 100 MG tablet TAKE 1 TABLET TWICE A DAY TO CONTROL  BLOOD PRESSURE 11/29/14  Yes Tiffany L Reed, DO  LORazepam (ATIVAN) 0.5 MG tablet TAKE 1 TABLET BY MOUTH AT BEDTIME AS NEEDED FOR REST 03/18/15  Yes Tiffany L Reed, DO  Multiple Vitamins-Minerals (ICAPS) CAPS Take 1 capsule by mouth daily.    Yes Historical Provider, MD  NIFEdipine (PROCARDIA-XL/ADALAT CC) 60 MG 24 hr tablet TAKE 1 TABLET DAILY FOR BLOOD PRESSURE 01/24/15  Yes Tiffany L Reed, DO  omeprazole (PRILOSEC OTC) 20 MG tablet Take 20 mg by mouth daily.   Yes Historical Provider, MD  psyllium (METAMUCIL) 58.6 % powder Take 1 packet by mouth daily.    Yes Historical Provider, MD  Ranibizumab (LUCENTIS) 0.3 MG/0.05ML SOLN Inject 1 Dose into the eye every 5 (five) weeks. Pt uses in right eye.   Yes Historical Provider, MD  telmisartan (MICARDIS) 80 MG tablet TAKE 1 TABLET BY MOUTH EVERY DAY FOR BLOOD PRESSURE 11/29/14  Yes Estill Dooms, MD    ROS:  Out of a complete 14 system review of symptoms, the patient complains only of the following symptoms, and all other reviewed systems are negative.  Fatigue Eye itching, loss of vision, blurred vision Restless legs, insomnia Gait disturbance  Blood pressure 116/57, pulse 63, height 5\' 5"  (1.651 m), weight 150 lb 6.4 oz (68.221 kg).  Physical Exam  General: The patient is alert and cooperative at the time of the examination.  Skin: No significant peripheral edema is noted.   Neurologic Exam  Mental status: The patient is alert and oriented x 3 at the time of the examination. The patient has apparent normal recent and remote memory, with an apparently normal attention span and concentration ability.   Cranial nerves: Facial symmetry is present. Speech is normal, no aphasia or dysarthria is noted. Extraocular movements are full. Visual fields are full.  Motor: The patient has good strength in all 4 extremities.  Sensory examination: Soft touch sensation is symmetric on the face, arms, and legs. There is a stocking pattern pinprick  sensory deficit to just below the knees bilaterally.  Coordination: The patient has good finger-nose-finger and heel-to-shin bilaterally.  Gait and station: The patient has a wide-based, unsteady gait, she uses a walker for ambulation. Tandem gait was not attempted. Romberg is negative, but is unsteady. No drift is seen.  Reflexes: Deep tendon reflexes are symmetric, but are depressed.   Assessment/Plan:  1. Peripheral neuropathy  2. Gait instability  3. Restless leg syndrome  The patient will continue the low-dose gabapentin, we discussed potentially going to low-dose Lyrica to see if this works better, but the patient does not wish to change medications. We will follow-up in one year, or sooner if needed.  Jill Alexanders MD 05/10/2015 7:31 PM  [facility] [phone]  [address] [place], [place] [address]  Phone [phone] Fax [phone]

## 2015-05-10 NOTE — Patient Instructions (Signed)

## 2015-05-23 ENCOUNTER — Other Ambulatory Visit: Payer: Self-pay | Admitting: Internal Medicine

## 2015-06-09 DIAGNOSIS — H3532 Exudative age-related macular degeneration: Secondary | ICD-10-CM | POA: Diagnosis not present

## 2015-06-14 ENCOUNTER — Other Ambulatory Visit: Payer: Self-pay | Admitting: Internal Medicine

## 2015-06-30 ENCOUNTER — Non-Acute Institutional Stay: Payer: Medicare Other | Admitting: Nurse Practitioner

## 2015-06-30 ENCOUNTER — Encounter: Payer: Self-pay | Admitting: Nurse Practitioner

## 2015-06-30 VITALS — BP 138/74 | HR 68 | Temp 97.8°F | Wt 146.0 lb

## 2015-06-30 DIAGNOSIS — R609 Edema, unspecified: Secondary | ICD-10-CM | POA: Diagnosis not present

## 2015-06-30 DIAGNOSIS — G4700 Insomnia, unspecified: Secondary | ICD-10-CM

## 2015-06-30 DIAGNOSIS — I10 Essential (primary) hypertension: Secondary | ICD-10-CM

## 2015-06-30 DIAGNOSIS — G63 Polyneuropathy in diseases classified elsewhere: Secondary | ICD-10-CM

## 2015-06-30 DIAGNOSIS — F411 Generalized anxiety disorder: Secondary | ICD-10-CM | POA: Diagnosis not present

## 2015-06-30 DIAGNOSIS — K219 Gastro-esophageal reflux disease without esophagitis: Secondary | ICD-10-CM

## 2015-06-30 DIAGNOSIS — G629 Polyneuropathy, unspecified: Secondary | ICD-10-CM | POA: Diagnosis not present

## 2015-06-30 DIAGNOSIS — K5900 Constipation, unspecified: Secondary | ICD-10-CM | POA: Diagnosis not present

## 2015-06-30 DIAGNOSIS — R1310 Dysphagia, unspecified: Secondary | ICD-10-CM

## 2015-06-30 DIAGNOSIS — R42 Dizziness and giddiness: Secondary | ICD-10-CM

## 2015-06-30 NOTE — Assessment & Plan Note (Signed)
Controlled, continue Nifedipine 60mg , Labetalol 100mg , and Telmisartan 80mg .

## 2015-06-30 NOTE — Assessment & Plan Note (Signed)
OffTemazepam--then Ativan 0.25mg  qod-successful dose reduction.  --sleeps well.

## 2015-06-30 NOTE — Assessment & Plan Note (Signed)
Stable, no change of tx

## 2015-06-30 NOTE — Assessment & Plan Note (Signed)
Trace edema on and off. Takes prn Furosemide only a few times.

## 2015-06-30 NOTE — Progress Notes (Signed)
Patient ID: Jacqueline Dodson, female   DOB: 14-May-1927, 81 y.o.   MRN: [id number]  Location:  clinic [place] Provider:  Marlana Latus NP  Code Status:  DNR Goals of care: Advanced Directive information Does patient have an advance directive?: Yes, Type of Advance Directive: [place];Living will  Chief Complaint  Patient presents with  . Medical Management of Chronic Issues    blood pressure, anxiety, edema  . Diarrhea    cramping in abdominal, nausea after eating for 5 days, clears up then returns.     HPI: Patient is a 81 y.o. female seen in the clinic at [facility] today for evaluation of  C/o nausea after meals, but its getting better, denied constipation, diarrhea, not vomited, better w/o intervention, stated loose stools but only x 1 today while taking dietary fiber  Review of Systems:  Review of Systems  Constitutional: Negative for fever, chills and diaphoresis.  HENT: Negative for congestion, ear pain, hearing loss and sore throat.   Eyes: Negative for pain, discharge and redness.  Respiratory: Negative for cough, shortness of breath and wheezing.   Cardiovascular: Positive for leg swelling. Negative for chest pain and palpitations.       BLE R trace  Gastrointestinal: Positive for nausea. Negative for vomiting, abdominal pain, diarrhea and constipation.       Bloated since Gabapentin(Omeprazole helped in the past)  Genitourinary: Positive for frequency. Negative for dysuria, urgency and flank pain.       Cystocele stage III out side her body now.   Musculoskeletal: Positive for back pain (right SIJ-reoslved. ). Negative for myalgias and neck pain.  Skin: Negative for rash.  Neurological: Negative for dizziness, tremors, seizures, weakness and headaches.       Tingling and burning sensation, muscle spasm, shooting pain in BLE on and off-much better since Gabapentin.   Psychiatric/Behavioral: Negative for hallucinations. The patient is nervous/anxious  (better managed with Ativan. ).     Past Medical History  Diagnosis Date  . Dermatophytosis of the body   . Anxiety   . Restless legs syndrome (RLS)   . Primary cerebellar degeneration   . Unspecified hereditary and idiopathic peripheral neuropathy   . Macular degeneration (senile) of retina, unspecified   . Hypertension   . Left bundle branch hemiblock   . Acute, but ill-defined, cerebrovascular disease   . Hemorrhoids   . Orthostatic hypotension   . GERD (gastroesophageal reflux disease)   . Duodenal ulcer, unspecified as acute or chronic, without hemorrhage, perforation, or obstruction   . Unspecified constipation   . Chronic kidney disease, stage II (mild)   . Cystocele, midline   . Rectocele   . Postmenopausal atrophic vaginitis   . Lumbago   . Myalgia and myositis, unspecified   . Dizziness and giddiness   . Other malaise and fatigue   . Abnormality of gait   . Disturbance of skin sensation   . Edema   . Urgency of urination   . Other abnormal blood chemistry   . Elevated blood pressure reading without diagnosis of hypertension   . Personal history of fall   . Arthritis   . Polyneuropathy in other diseases classified elsewhere 02/16/2014    Patient Active Problem List   Diagnosis Date Noted  . Lightheadedness 03/27/2015  . Dysphagia 12/16/2014  . GERD (gastroesophageal reflux disease) 09/09/2014  . Edema 09/09/2014  . Polyneuropathy in other diseases classified elsewhere 02/16/2014  . Abnormality of gait 02/16/2014  . Restless legs  syndrome (RLS) 02/16/2014  . Constipation 12/20/2013  . Peripheral neuropathy 12/20/2013  . Rectal mucosa prolapse 04/13/2013  . Osteoarthritis of right knee 01/15/2013  . Osteoarthritis of right hip 01/15/2013  . HTN (hypertension) 01/15/2013  . Insomnia 01/15/2013  . Generalized anxiety disorder 01/15/2013  . Bladder cystocele 01/15/2013    Allergies  Allergen Reactions  . Amlodipine Other (See Comments)    Reaction:   Critically decreased sodium and potassium levels.    . Enalapril Other (See Comments)    Reaction:  Unknown   . Hctz [Hydrochlorothiazide] Other (See Comments)    Reaction:  Unknown   . Penicillins Other (See Comments)    Reaction:  Unknown   . Amoxicillin Swelling and Rash    Medications: Patient's Medications  New Prescriptions   No medications on file  Previous Medications   BISMUTH SUBSALICYLATE (PEPTO BISMOL) 262 MG CHEWABLE TABLET    Chew 524 mg by mouth. Take two tablets as needed for loose stools, abdominal cramping   CALCIUM CARBONATE (TUMS - DOSED IN MG ELEMENTAL CALCIUM) 500 MG CHEWABLE TABLET    Chew 1 tablet by mouth. Take one after each meal and at bedtime for indigestion or heartburn   FUROSEMIDE (LASIX) 20 MG TABLET    Take 20 mg by mouth daily as needed for edema.    GABAPENTIN (NEURONTIN) 100 MG CAPSULE    Take 1 tablet by mouth at breakfast and  2  tablets at bedtime.   LABETALOL (NORMODYNE) 100 MG TABLET    TAKE 1 TABLET TWICE A DAY TO CONTROL BLOOD PRESSURE   LORAZEPAM (ATIVAN) 0.5 MG TABLET    TAKE 1 TABLET BY MOUTH AT BEDTIME AS NEEDED FOR REST   MULTIPLE VITAMINS-MINERALS (ICAPS) CAPS    Take 1 capsule by mouth daily.    NIFEDIPINE (PROCARDIA-XL/ADALAT CC) 60 MG 24 HR TABLET    TAKE 1 TABLET DAILY FOR BLOOD PRESSURE   OMEPRAZOLE (PRILOSEC OTC) 20 MG TABLET    Take 20 mg by mouth daily.   PSYLLIUM (METAMUCIL) 58.6 % POWDER    Take 1 packet by mouth daily.    RANIBIZUMAB (LUCENTIS) 0.3 MG/0.05ML SOLN    Inject 1 Dose into the eye every 5 (five) weeks. Pt uses in right eye.   TELMISARTAN (MICARDIS) 80 MG TABLET    TAKE 1 TABLET BY MOUTH EVERY DAY FOR BLOOD PRESSURE  Modified Medications   No medications on file  Discontinued Medications   No medications on file    Physical Exam: Filed Vitals:   06/30/15 1438  BP: 138/74  Pulse: 68  Temp: 97.8 F (36.6 C)  TempSrc: Oral  Weight: 146 lb (66.225 kg)  SpO2: 98%   Body mass index is 24.3  kg/(m^2).  Physical Exam  Constitutional: She is oriented to person, place, and time. She appears well-developed and well-nourished. No distress.  HENT:  Head: Normocephalic and atraumatic.  Eyes: EOM are normal. Pupils are equal, round, and reactive to light. Right eye exhibits no discharge.  Neck: Normal range of motion. Neck supple. No thyromegaly present.  Cardiovascular: Normal rate, regular rhythm and normal heart sounds.   No murmur heard. Pulmonary/Chest: Effort normal and breath sounds normal. She has no wheezes. She has no rales.  Abdominal: Soft. Bowel sounds are normal. She exhibits no distension. There is no tenderness.  Musculoskeletal: Normal range of motion. She exhibits edema. She exhibits no tenderness (right SIJ, hip, knee tenderness, resolved. ).  RLE trace  Neurological: She is alert and oriented  to person, place, and time. She has normal reflexes. No cranial nerve deficit. She exhibits normal muscle tone. Coordination normal.  Skin: Skin is warm and dry. No rash noted. She is not diaphoretic. No erythema.  Psychiatric: She has a normal mood and affect. Her behavior is normal. Judgment and thought content normal.    Labs reviewed: Basic Metabolic Panel:  Recent Labs  12/12/14 1726 03/15/15  NA 141 139  K 4.2 4.2  CL 108  --   CO2 22  --   GLUCOSE 130*  --   BUN 30* 29*  CREATININE 1.46* 1.3*  CALCIUM 9.9  --     Liver Function Tests:  Recent Labs  12/12/14 1726  AST 37  ALT 28  ALKPHOS 82  BILITOT 0.9  PROT 8.5*  ALBUMIN 4.8    CBC:  Recent Labs  12/12/14 1726  WBC 10.5  NEUTROABS 8.7*  HGB 15.1*  HCT 42.8  MCV 88.1  PLT 228    Lab Results  Component Value Date   TSH 2.17 07/15/2012   No results found for: HGBA1C No results found for: CHOL, HDL, LDLCALC, LDLDIRECT, TRIG, CHOLHDL  Significant Diagnostic Results since last visit: none  Patient Care Team: Estill Dooms, MD as PCP - General (Internal Medicine) Estill Dooms,  MD as PCP - Internal Medicine (Genetics) Lavonna Monarch, MD as Consulting Physician (Dermatology) Clent Jacks, MD as Consulting Physician (Ophthalmology) Elmarie Shiley, MD as Consulting Physician (Nephrology) Hurman Horn, MD as Consulting Physician (Ophthalmology) [place] Man Mast X, NP as Nurse Practitioner (Nurse Practitioner)  Assessment/Plan Problem List Items Addressed This Visit    Polyneuropathy in other diseases classified elsewhere (Chronic)    Had onduction study. Has off and on prickling and burning sensation, spasm, shooting pain. [facility] referral. Didn't desire any pain management.  02/2014 saw Dr Jannifer Franklin: restless leg and neuropathy.  --decreased Gabapentin to 100mg  bid  --stable      Peripheral neuropathy    Stated she had nerve conduction study. Has off and on prickling and burning sensation, spasm, shooting pain. [facility] referral. Didn't desire any pain management.  02/2014 saw Dr Jannifer Franklin: restless leg and neuropathy.  --stable, continue Gabapentin100mg  bid       Lightheadedness    No focal neurological deficit, not positional, better since decreased Neurontin to 100mg  bid. May consider carotid doppler and CT head to evaluate further is no better.       Insomnia    OffTemazepam--then Ativan 0.25mg  qod-successful dose reduction.  --sleeps well.       HTN (hypertension) - Primary (Chronic)    Controlled, continue Nifedipine 60mg , Labetalol 100mg , and Telmisartan 80mg .      GERD (gastroesophageal reflux disease)    Stable, decrease Omeprazole to 20mg  qod      Relevant Medications   bismuth subsalicylate (PEPTO BISMOL) 262 MG chewable tablet   Generalized anxiety disorder (Chronic)    Stable, continue  Ativan 0.25mg  qod.        Edema    Trace edema on and off. Takes prn Furosemide only a few times.       Dysphagia    Stable, no change of tx      Constipation    Loose stools x 5 days, had one today, stated her nausea is  getting better, denied abd pain, she is afebrile, hold Psyllium tid. Continue to observe the patient. Encourage oral fluid intake.           Family/  staff Communication: observe the patient, encourage oral fluid intake.   Labs/tests ordered: none  ManXie Mast NP [facility] [address], [place] [address] On Call:  [phone] & follow prompts after 5pm & weekends Office Phone:  [phone] Office Fax:  [phone]

## 2015-06-30 NOTE — Assessment & Plan Note (Addendum)
Stable, decrease Omeprazole to 20mg  qod

## 2015-06-30 NOTE — Assessment & Plan Note (Signed)
No focal neurological deficit, not positional, better since decreased Neurontin to 100mg  bid. May consider carotid doppler and CT head to evaluate further is no better.

## 2015-06-30 NOTE — Assessment & Plan Note (Signed)
Stable, continue  Ativan 0.25mg  qod.

## 2015-06-30 NOTE — Assessment & Plan Note (Signed)
Stated she had nerve conduction study. Has off and on prickling and burning sensation, spasm, shooting pain. [facility] referral. Didn't desire any pain management.  02/2014 saw Dr Jannifer Franklin: restless leg and neuropathy.  --stable, continue Gabapentin100mg  bid

## 2015-06-30 NOTE — Assessment & Plan Note (Signed)
Had onduction study. Has off and on prickling and burning sensation, spasm, shooting pain. [facility] referral. Didn't desire any pain management.  02/2014 saw Dr Jannifer Franklin: restless leg and neuropathy.  --decreased Gabapentin to 100mg  bid  --stable

## 2015-06-30 NOTE — Assessment & Plan Note (Addendum)
Loose stools x 5 days, had one today, stated her nausea is getting better, denied abd pain, she is afebrile, hold Psyllium tid. Continue to observe the patient. Encourage oral fluid intake.

## 2015-07-02 DIAGNOSIS — Z23 Encounter for immunization: Secondary | ICD-10-CM | POA: Diagnosis not present

## 2015-07-11 ENCOUNTER — Other Ambulatory Visit: Payer: Self-pay | Admitting: Adult Health

## 2015-07-21 DIAGNOSIS — H353211 Exudative age-related macular degeneration, right eye, with active choroidal neovascularization: Secondary | ICD-10-CM | POA: Diagnosis not present

## 2015-08-25 DIAGNOSIS — H353211 Exudative age-related macular degeneration, right eye, with active choroidal neovascularization: Secondary | ICD-10-CM | POA: Diagnosis not present

## 2015-09-13 ENCOUNTER — Other Ambulatory Visit: Payer: Self-pay | Admitting: Nurse Practitioner

## 2015-09-21 DIAGNOSIS — N2581 Secondary hyperparathyroidism of renal origin: Secondary | ICD-10-CM | POA: Diagnosis not present

## 2015-09-21 DIAGNOSIS — R5383 Other fatigue: Secondary | ICD-10-CM | POA: Diagnosis not present

## 2015-09-21 DIAGNOSIS — N182 Chronic kidney disease, stage 2 (mild): Secondary | ICD-10-CM | POA: Diagnosis not present

## 2015-09-21 DIAGNOSIS — I129 Hypertensive chronic kidney disease with stage 1 through stage 4 chronic kidney disease, or unspecified chronic kidney disease: Secondary | ICD-10-CM | POA: Diagnosis not present

## 2015-09-29 DIAGNOSIS — H35352 Cystoid macular degeneration, left eye: Secondary | ICD-10-CM | POA: Diagnosis not present

## 2015-09-29 DIAGNOSIS — H353222 Exudative age-related macular degeneration, left eye, with inactive choroidal neovascularization: Secondary | ICD-10-CM | POA: Diagnosis not present

## 2015-09-29 DIAGNOSIS — H35351 Cystoid macular degeneration, right eye: Secondary | ICD-10-CM | POA: Diagnosis not present

## 2015-09-29 DIAGNOSIS — H353134 Nonexudative age-related macular degeneration, bilateral, advanced atrophic with subfoveal involvement: Secondary | ICD-10-CM | POA: Diagnosis not present

## 2015-09-29 DIAGNOSIS — H353211 Exudative age-related macular degeneration, right eye, with active choroidal neovascularization: Secondary | ICD-10-CM | POA: Diagnosis not present

## 2015-10-10 ENCOUNTER — Other Ambulatory Visit: Payer: Self-pay | Admitting: Nurse Practitioner

## 2015-10-21 ENCOUNTER — Other Ambulatory Visit: Payer: Self-pay | Admitting: *Deleted

## 2015-10-21 MED ORDER — NIFEDIPINE ER 60 MG PO TB24: ORAL_TABLET | ORAL | Status: DC

## 2015-10-21 MED ORDER — LABETALOL HCL 100 MG PO TABS: ORAL_TABLET | ORAL | Status: DC

## 2015-10-21 MED ORDER — TELMISARTAN 80 MG PO TABS: ORAL_TABLET | ORAL | Status: DC

## 2015-10-21 NOTE — Telephone Encounter (Signed)
[facility]

## 2015-10-24 ENCOUNTER — Other Ambulatory Visit: Payer: Self-pay

## 2015-10-24 ENCOUNTER — Other Ambulatory Visit: Payer: Self-pay | Admitting: *Deleted

## 2015-10-24 MED ORDER — NIFEDIPINE ER 60 MG PO TB24: ORAL_TABLET | ORAL | Status: DC

## 2015-10-24 MED ORDER — GABAPENTIN 100 MG PO CAPS: 100.0000 mg | ORAL_CAPSULE | Freq: Two times a day (BID) | ORAL | Status: DC

## 2015-10-24 NOTE — Telephone Encounter (Signed)
[facility]

## 2015-10-26 ENCOUNTER — Other Ambulatory Visit: Payer: Self-pay | Admitting: *Deleted

## 2015-10-26 MED ORDER — LORAZEPAM 0.5 MG PO TABS: ORAL_TABLET | ORAL | Status: DC

## 2015-10-26 NOTE — Telephone Encounter (Signed)
Patient requested Rx to be sent to [place]

## 2015-11-02 DIAGNOSIS — H353211 Exudative age-related macular degeneration, right eye, with active choroidal neovascularization: Secondary | ICD-10-CM | POA: Diagnosis not present

## 2015-12-07 DIAGNOSIS — H353222 Exudative age-related macular degeneration, left eye, with inactive choroidal neovascularization: Secondary | ICD-10-CM | POA: Diagnosis not present

## 2015-12-07 DIAGNOSIS — H353134 Nonexudative age-related macular degeneration, bilateral, advanced atrophic with subfoveal involvement: Secondary | ICD-10-CM | POA: Diagnosis not present

## 2015-12-07 DIAGNOSIS — H353211 Exudative age-related macular degeneration, right eye, with active choroidal neovascularization: Secondary | ICD-10-CM | POA: Diagnosis not present

## 2015-12-16 ENCOUNTER — Encounter: Payer: Self-pay | Admitting: *Deleted

## 2015-12-29 ENCOUNTER — Encounter: Payer: Self-pay | Admitting: Nurse Practitioner

## 2015-12-29 ENCOUNTER — Non-Acute Institutional Stay: Payer: Medicare Other | Admitting: Nurse Practitioner

## 2015-12-29 VITALS — BP 120/62 | HR 74 | Temp 97.5°F | Resp 20 | Ht 65.0 in | Wt 149.4 lb

## 2015-12-29 DIAGNOSIS — G4700 Insomnia, unspecified: Secondary | ICD-10-CM

## 2015-12-29 DIAGNOSIS — G63 Polyneuropathy in diseases classified elsewhere: Secondary | ICD-10-CM | POA: Diagnosis not present

## 2015-12-29 DIAGNOSIS — N3942 Incontinence without sensory awareness: Secondary | ICD-10-CM

## 2015-12-29 DIAGNOSIS — I10 Essential (primary) hypertension: Secondary | ICD-10-CM

## 2015-12-29 DIAGNOSIS — R159 Full incontinence of feces: Secondary | ICD-10-CM | POA: Diagnosis not present

## 2015-12-29 DIAGNOSIS — K219 Gastro-esophageal reflux disease without esophagitis: Secondary | ICD-10-CM

## 2015-12-29 DIAGNOSIS — R269 Unspecified abnormalities of gait and mobility: Secondary | ICD-10-CM

## 2015-12-29 DIAGNOSIS — F411 Generalized anxiety disorder: Secondary | ICD-10-CM

## 2015-12-29 DIAGNOSIS — R609 Edema, unspecified: Secondary | ICD-10-CM | POA: Diagnosis not present

## 2015-12-29 DIAGNOSIS — R32 Unspecified urinary incontinence: Secondary | ICD-10-CM | POA: Insufficient documentation

## 2015-12-29 NOTE — Assessment & Plan Note (Signed)
Loss control sometimes.

## 2015-12-29 NOTE — Assessment & Plan Note (Signed)
OffTemazepam--then Ativan 0.25mg  hs prn-successful dose reduction.  --sleeps well.

## 2015-12-29 NOTE — Assessment & Plan Note (Signed)
Kegel exercise helped.

## 2015-12-29 NOTE — Assessment & Plan Note (Addendum)
Trace edema on and off. Continue Furosemide 20mg  prn-not used since Gabapentin dose reduction,  update CMP

## 2015-12-29 NOTE — Assessment & Plan Note (Signed)
Weakness in BLE, peripheral neuropathy contributory.

## 2015-12-29 NOTE — Assessment & Plan Note (Addendum)
Stable, continue  Ativan 0.25mg  hs prn, update TSH

## 2015-12-29 NOTE — Assessment & Plan Note (Addendum)
Stable, decrease Omeprazole to 20mg  qod, update CBC

## 2015-12-29 NOTE — Assessment & Plan Note (Addendum)
Stated she had nerve conduction study. Has off and on prickling and burning sensation, spasm, shooting pain. [facility] referral. Didn't desire any pain management.  02/2014 saw Dr Jannifer Franklin: restless leg and neuropathy.  --stable, continue Gabapentin100mg  bid

## 2015-12-29 NOTE — Progress Notes (Signed)
Patient ID: Jacqueline Dodson, female   DOB: 05/18/1927, 82 y.o.   MRN: [id number]   Location:  [facility] (12)   Provider: Marlana Latus NP  Code Status: DNR Goals of Care:  Advanced Directives 06/30/2015  Does patient have an advance directive? Yes  Type of Paramedic of [place];Living will  Copy of advanced directive(s) in chart? Yes  Would patient like information on creating an advanced directive? -     Chief Complaint  Patient presents with  . Medical Management of Chronic Issues    HPI: Patient is a 82 y.o. female seen today for medical management of chronic diseases.  Hx of HTN, blood pressure is controlled while on Nifedipine 60mg  daily, Micardis 80mg  daily, Labetalol 100mg  bid.  Chronic BLE edema is managed with Furosemide 20mg  prn, last Bun/creat 29/1.3 03/15/15. Peripheral neuropathy, pain is managed with Gabapentin 100mg  bid. GERD stable, taking Omeprazole 20mg  qod.  Prn Lorazepam 0.5mg  available to her for night rest, no negative side effects seen.    Past Medical History  Diagnosis Date  . Dermatophytosis of the body   . Anxiety   . Restless legs syndrome (RLS)   . Primary cerebellar degeneration ([place])   . Unspecified hereditary and idiopathic peripheral neuropathy   . Macular degeneration (senile) of retina, unspecified   . Hypertension   . Left bundle branch hemiblock   . Acute, but ill-defined, cerebrovascular disease ([place])   . Hemorrhoids   . Orthostatic hypotension   . GERD (gastroesophageal reflux disease)   . Duodenal ulcer, unspecified as acute or chronic, without hemorrhage, perforation, or obstruction   . Unspecified constipation   . Chronic kidney disease, stage II (mild)   . Cystocele, midline   . Rectocele   . Postmenopausal atrophic vaginitis   . Lumbago   . Myalgia and myositis, unspecified   . Dizziness and giddiness   . Other malaise and fatigue   . Abnormality of gait   . Disturbance  of skin sensation   . Edema   . Urgency of urination   . Other abnormal blood chemistry   . Elevated blood pressure reading without diagnosis of hypertension   . Personal history of fall   . Arthritis   . Polyneuropathy in other diseases classified elsewhere [facility]) 02/16/2014    Past Surgical History  Procedure Laterality Date  . Tonsillectomy  1940  . Appendectomy  1945  . External ear surgery  2010    left outer ear basel cell removed  . Abdominal hysterectomy  1972  . Cataract extraction Bilateral     Allergies  Allergen Reactions  . Amlodipine Other (See Comments)    Reaction:  Critically decreased sodium and potassium levels.    . Enalapril Other (See Comments)    Reaction:  Unknown   . Hctz [Hydrochlorothiazide] Other (See Comments)    Reaction:  Unknown   . Penicillins Other (See Comments)    Reaction:  Unknown   . Amoxicillin Swelling and Rash      Medication List       This list is accurate as of: 12/29/15  2:36 PM.  Always use your most recent med list.               bismuth subsalicylate 99991111 MG chewable tablet  Commonly known as:  PEPTO BISMOL  Chew 524 mg by mouth. Take two tablets as needed for loose stools, abdominal cramping     calcium  carbonate 500 MG chewable tablet  Commonly known as:  TUMS - dosed in mg elemental calcium  Chew 1 tablet by mouth. Take one after each meal and at bedtime for indigestion or heartburn     furosemide 20 MG tablet  Commonly known as:  LASIX  Take 20 mg by mouth daily as needed for edema.     gabapentin 100 MG capsule  Commonly known as:  NEURONTIN  Take 1 tablet by mouth at breakfast and  2  tablets at bedtime.     gabapentin 100 MG capsule  Commonly known as:  NEURONTIN  Take 1 capsule (100 mg total) by mouth 2 (two) times daily.     ICAPS Caps  Take 1 capsule by mouth daily.     labetalol 100 MG tablet  Commonly known as:  NORMODYNE  Take one tablet by mouth twice daily to control blood pressure      LORazepam 0.5 MG tablet  Commonly known as:  ATIVAN  Take one tablet by mouth at bedtime as needed for rest     LUCENTIS 0.3 MG/0.05ML Soln  Generic drug:  Ranibizumab  Inject 1 Dose into the eye every 5 (five) weeks. Pt uses in right eye.     NIFEdipine 60 MG 24 hr tablet  Commonly known as:  PROCARDIA-XL/ADALAT CC  Take one tablet by mouth once daily for blood pressure     omeprazole 20 MG tablet  Commonly known as:  PRILOSEC OTC  Take 20 mg by mouth daily.     psyllium 58.6 % powder  Commonly known as:  METAMUCIL  Take 1 packet by mouth daily.     telmisartan 80 MG tablet  Commonly known as:  MICARDIS  Take one tablet by mouth once daily for blood pressure        Review of Systems:  Review of Systems  Constitutional: Negative for fever, chills and diaphoresis.  HENT: Negative for congestion, ear pain, hearing loss and sore throat.   Eyes: Negative for pain, discharge and redness.  Respiratory: Negative for cough, shortness of breath and wheezing.   Cardiovascular: Positive for leg swelling. Negative for chest pain and palpitations.       BLE R trace  Gastrointestinal: Positive for nausea. Negative for vomiting, abdominal pain, diarrhea and constipation.       Bloated since Gabapentin(Omeprazole helped in the past)  Genitourinary: Positive for frequency. Negative for dysuria, urgency and flank pain.       Cystocele stage III out side her body now. Incontinent B+B sometimes  Musculoskeletal: Positive for back pain (right SIJ-reoslved. ). Negative for myalgias and neck pain.  Skin: Negative for rash.  Neurological: Positive for weakness. Negative for dizziness, tremors, seizures and headaches.       Tingling and burning sensation, muscle spasm, shooting pain in BLE on and off-much better since Gabapentin.  BLE weakness sometimes. Ambulates with walker.   Psychiatric/Behavioral: Negative for hallucinations. The patient is nervous/anxious (better managed with Ativan. ).      Health Maintenance  Topic Date Due  . DEXA SCAN  05/04/1994  . PNA vac Low Risk Adult (2 of 2 - PCV13) 10/09/1995  . TETANUS/TDAP  10/08/2005  . INFLUENZA VACCINE  05/08/2016  . ZOSTAVAX  Completed    Physical Exam: Filed Vitals:   12/29/15 1400  BP: 120/62  Pulse: 74  Temp: 97.5 F (36.4 C)  TempSrc: Oral  Resp: 20  Height: 5\' 5"  (1.651 m)  Weight: 149 lb 6.4 oz (  67.767 kg)  SpO2: 98%   Body mass index is 24.86 kg/(m^2). Physical Exam  Constitutional: She is oriented to person, place, and time. She appears well-developed and well-nourished. No distress.  HENT:  Head: Normocephalic and atraumatic.  Eyes: EOM are normal. Pupils are equal, round, and reactive to light. Right eye exhibits no discharge.  Neck: Normal range of motion. Neck supple. No thyromegaly present.  Cardiovascular: Normal rate and regular rhythm.   Murmur heard. 2-3/6 ejection murmurs left sternal border  Pulmonary/Chest: Effort normal and breath sounds normal. She has no wheezes. She has no rales.  Abdominal: Soft. Bowel sounds are normal. She exhibits no distension. There is no tenderness.  Musculoskeletal: Normal range of motion. She exhibits edema. She exhibits no tenderness (right SIJ, hip, knee tenderness, resolved. ).  RLE trace  Neurological: She is alert and oriented to person, place, and time. She has normal reflexes. No cranial nerve deficit. She exhibits normal muscle tone. Coordination normal.  Skin: Skin is warm and dry. No rash noted. She is not diaphoretic. No erythema.  Psychiatric: She has a normal mood and affect. Her behavior is normal. Judgment and thought content normal.    Labs reviewed: Basic Metabolic Panel:  Recent Labs  03/15/15  NA 139  K 4.2  BUN 29*  CREATININE 1.3*   Liver Function Tests: No results for input(s): AST, ALT, ALKPHOS, BILITOT, PROT, ALBUMIN in the last 8760 hours. No results for input(s): LIPASE, AMYLASE in the last 8760 hours. No results for  input(s): AMMONIA in the last 8760 hours. CBC: No results for input(s): WBC, NEUTROABS, HGB, HCT, MCV, PLT in the last 8760 hours. Lipid Panel: No results for input(s): CHOL, HDL, LDLCALC, TRIG, CHOLHDL, LDLDIRECT in the last 8760 hours. No results found for: HGBA1C  Procedures since last visit: No results found.  Assessment/Plan  Edema Trace edema on and off. Continue Furosemide 20mg  prn-not used since Gabapentin dose reduction,  update CMP  Generalized anxiety disorder Stable, continue  Ativan 0.25mg  hs prn, update TSH  GERD (gastroesophageal reflux disease) Stable, decrease Omeprazole to 20mg  qod, update CBC  HTN (hypertension) Controlled, continue Nifedipine 60mg , Labetalol 100mg , and Telmisartan 80mg .  Insomnia OffTemazepam--then Ativan 0.25mg  hs prn-successful dose reduction.  --sleeps well.   Polyneuropathy in other diseases classified elsewhere Stated she had nerve conduction study. Has off and on prickling and burning sensation, spasm, shooting pain. [facility] referral. Didn't desire any pain management.  02/2014 saw Dr Jannifer Franklin: restless leg and neuropathy.  --stable, continue Gabapentin100mg  bid  Abnormality of gait Weakness in BLE, peripheral neuropathy contributory.   Incontinent of urine Kegel exercise helped.   Bowel incontinence Loss control sometimes.     Labs/tests ordered:  @ORDERS @ CBC, CMP, TSH Next appt:  Visit date not found

## 2015-12-29 NOTE — Assessment & Plan Note (Signed)
Controlled, continue Nifedipine 60mg , Labetalol 100mg , and Telmisartan 80mg .

## 2016-01-18 DIAGNOSIS — H353211 Exudative age-related macular degeneration, right eye, with active choroidal neovascularization: Secondary | ICD-10-CM | POA: Diagnosis not present

## 2016-01-24 ENCOUNTER — Other Ambulatory Visit: Payer: Self-pay | Admitting: *Deleted

## 2016-01-24 MED ORDER — LORAZEPAM 0.5 MG PO TABS: ORAL_TABLET | ORAL | Status: DC

## 2016-01-26 ENCOUNTER — Other Ambulatory Visit: Payer: Self-pay | Admitting: *Deleted

## 2016-01-26 MED ORDER — LORAZEPAM 0.5 MG PO TABS: ORAL_TABLET | ORAL | Status: DC

## 2016-03-01 DIAGNOSIS — H353222 Exudative age-related macular degeneration, left eye, with inactive choroidal neovascularization: Secondary | ICD-10-CM | POA: Diagnosis not present

## 2016-03-01 DIAGNOSIS — H353211 Exudative age-related macular degeneration, right eye, with active choroidal neovascularization: Secondary | ICD-10-CM | POA: Diagnosis not present

## 2016-03-01 DIAGNOSIS — H353134 Nonexudative age-related macular degeneration, bilateral, advanced atrophic with subfoveal involvement: Secondary | ICD-10-CM | POA: Diagnosis not present

## 2016-03-24 ENCOUNTER — Other Ambulatory Visit: Payer: Self-pay | Admitting: Neurology

## 2016-04-04 DIAGNOSIS — H26491 Other secondary cataract, right eye: Secondary | ICD-10-CM | POA: Diagnosis not present

## 2016-04-04 DIAGNOSIS — H01022 Squamous blepharitis right lower eyelid: Secondary | ICD-10-CM | POA: Diagnosis not present

## 2016-04-04 DIAGNOSIS — H01024 Squamous blepharitis left upper eyelid: Secondary | ICD-10-CM | POA: Diagnosis not present

## 2016-04-04 DIAGNOSIS — H353124 Nonexudative age-related macular degeneration, left eye, advanced atrophic with subfoveal involvement: Secondary | ICD-10-CM | POA: Diagnosis not present

## 2016-04-04 DIAGNOSIS — Z961 Presence of intraocular lens: Secondary | ICD-10-CM | POA: Diagnosis not present

## 2016-04-04 DIAGNOSIS — H353211 Exudative age-related macular degeneration, right eye, with active choroidal neovascularization: Secondary | ICD-10-CM | POA: Diagnosis not present

## 2016-04-04 DIAGNOSIS — H01025 Squamous blepharitis left lower eyelid: Secondary | ICD-10-CM | POA: Diagnosis not present

## 2016-04-04 DIAGNOSIS — H01021 Squamous blepharitis right upper eyelid: Secondary | ICD-10-CM | POA: Diagnosis not present

## 2016-04-04 DIAGNOSIS — H10413 Chronic giant papillary conjunctivitis, bilateral: Secondary | ICD-10-CM | POA: Diagnosis not present

## 2016-04-04 DIAGNOSIS — H40013 Open angle with borderline findings, low risk, bilateral: Secondary | ICD-10-CM | POA: Diagnosis not present

## 2016-04-12 DIAGNOSIS — H35351 Cystoid macular degeneration, right eye: Secondary | ICD-10-CM | POA: Diagnosis not present

## 2016-04-12 DIAGNOSIS — H35352 Cystoid macular degeneration, left eye: Secondary | ICD-10-CM | POA: Diagnosis not present

## 2016-04-12 DIAGNOSIS — H353222 Exudative age-related macular degeneration, left eye, with inactive choroidal neovascularization: Secondary | ICD-10-CM | POA: Diagnosis not present

## 2016-04-12 DIAGNOSIS — H353211 Exudative age-related macular degeneration, right eye, with active choroidal neovascularization: Secondary | ICD-10-CM | POA: Diagnosis not present

## 2016-04-12 DIAGNOSIS — H353134 Nonexudative age-related macular degeneration, bilateral, advanced atrophic with subfoveal involvement: Secondary | ICD-10-CM | POA: Diagnosis not present

## 2016-04-18 ENCOUNTER — Other Ambulatory Visit: Payer: Self-pay

## 2016-04-18 MED ORDER — LORAZEPAM 0.5 MG PO TABS: ORAL_TABLET | ORAL | Status: DC

## 2016-04-18 NOTE — Telephone Encounter (Signed)
Refill request [facility]

## 2016-05-08 ENCOUNTER — Encounter: Payer: Self-pay | Admitting: Neurology

## 2016-05-08 ENCOUNTER — Ambulatory Visit (INDEPENDENT_AMBULATORY_CARE_PROVIDER_SITE_OTHER): Payer: Medicare Other | Admitting: Neurology

## 2016-05-08 VITALS — BP 138/69 | HR 63 | Ht 65.0 in | Wt 143.0 lb

## 2016-05-08 DIAGNOSIS — R269 Unspecified abnormalities of gait and mobility: Secondary | ICD-10-CM

## 2016-05-08 DIAGNOSIS — G6289 Other specified polyneuropathies: Secondary | ICD-10-CM | POA: Diagnosis not present

## 2016-05-08 MED ORDER — GABAPENTIN 100 MG PO CAPS: 100.0000 mg | ORAL_CAPSULE | Freq: Two times a day (BID) | ORAL | 3 refills | Status: DC

## 2016-05-08 NOTE — Patient Instructions (Addendum)
Fall Prevention in the Home  Falls can cause injuries and can affect people from all age groups. There are many simple things that you can do to make your home safe and to help prevent falls. WHAT CAN I DO ON THE OUTSIDE OF MY HOME?  Regularly repair the edges of walkways and driveways and fix any cracks.  Remove high doorway thresholds.  Trim any shrubbery on the main path into your home.  Use bright outdoor lighting.  Clear walkways of debris and clutter, including tools and rocks.  Regularly check that handrails are securely fastened and in good repair. Both sides of any steps should have handrails.  Install guardrails along the edges of any raised decks or porches.  Have leaves, snow, and ice cleared regularly.  Use sand or salt on walkways during winter months.  In the garage, clean up any spills right away, including grease or oil spills. WHAT CAN I DO IN THE BATHROOM?  Use night lights.  Install grab bars by the toilet and in the tub and shower. Do not use towel bars as grab bars.  Use non-skid mats or decals on the floor of the tub or shower.  If you need to sit down while you are in the shower, use a plastic, non-slip stool..  Keep the floor dry. Immediately clean up any water that spills on the floor.  Remove soap buildup in the tub or shower on a regular basis.  Attach bath mats securely with double-sided non-slip rug tape.  Remove throw rugs and other tripping hazards from the floor. WHAT CAN I DO IN THE BEDROOM?  Use night lights.  Make sure that a bedside light is easy to reach.  Do not use oversized bedding that drapes onto the floor.  Have a firm chair that has side arms to use for getting dressed.  Remove throw rugs and other tripping hazards from the floor. WHAT CAN I DO IN THE KITCHEN?   Clean up any spills right away.  Avoid walking on wet floors.  Place frequently used items in easy-to-reach places.  If you need to reach for something  above you, use a sturdy step stool that has a grab bar.  Keep electrical cables out of the way.  Do not use floor polish or wax that makes floors slippery. If you have to use wax, make sure that it is non-skid floor wax.  Remove throw rugs and other tripping hazards from the floor. WHAT CAN I DO IN THE STAIRWAYS?  Do not leave any items on the stairs.  Make sure that there are handrails on both sides of the stairs. Fix handrails that are broken or loose. Make sure that handrails are as long as the stairways.  Check any carpeting to make sure that it is firmly attached to the stairs. Fix any carpet that is loose or worn.  Avoid having throw rugs at the top or bottom of stairways, or secure the rugs with carpet tape to prevent them from moving.  Make sure that you have a light switch at the top of the stairs and the bottom of the stairs. If you do not have them, have them installed. WHAT ARE SOME OTHER FALL PREVENTION TIPS?  Wear closed-toe shoes that fit well and support your feet. Wear shoes that have rubber soles or low heels.  When you use a stepladder, make sure that it is completely opened and that the sides are firmly locked. Have someone hold the ladder while you   are using it. Do not climb a closed stepladder.  Add color or contrast paint or tape to grab bars and handrails in your home. Place contrasting color strips on the first and last steps.  Use mobility aids as needed, such as canes, walkers, scooters, and crutches.  Turn on lights if it is dark. Replace any light bulbs that burn out.  Set up furniture so that there are clear paths. Keep the furniture in the same spot.  Fix any uneven floor surfaces.  Choose a carpet design that does not hide the edge of steps of a stairway.  Be aware of any and all pets.  Review your medicines with your healthcare provider. Some medicines can cause dizziness or changes in blood pressure, which increase your risk of falling. Talk  with your health care provider about other ways that you can decrease your risk of falls. This may include working with a physical therapist or trainer to improve your strength, balance, and endurance.   This information is not intended to replace advice given to you by your health care provider. Make sure you discuss any questions you have with your health care provider.   Document Released: 09/14/2002 Document Revised: 02/08/2015 Document Reviewed: 10/29/2014 Elsevier Interactive Patient Education 2016 [employer] Inc[employer]  

## 2016-05-08 NOTE — Progress Notes (Signed)
Reason for visit: Peripheral neuropathy  Jacqueline Dodson is an 82 y.o. female  History of present illness:  Jacqueline Dodson is an 82 year old right-handed white female with a history of a peripheral neuropathy. The patient has had some gait instability associated with this. The patient walks with a walker, she denies any falls since last seen. The patient is on gabapentin for the paresthesias associated with the peripheral neuropathy, she takes 100 mg twice daily. In the past, she has not been able tolerate higher doses of gabapentin secondary to ankle swelling. She did not tolerate Cymbalta. She has some issues with restless leg syndrome as well. The patient has not wanted to alter her medication dosing in the past. She returns to this office for an evaluation.  Past Medical History:  Diagnosis Date  . Abnormality of gait   . Acute, but ill-defined, cerebrovascular disease ([place])   . Anxiety   . Arthritis   . Chronic kidney disease, stage II (mild)   . Cystocele, midline   . Dermatophytosis of the body   . Disturbance of skin sensation   . Dizziness and giddiness   . Duodenal ulcer, unspecified as acute or chronic, without hemorrhage, perforation, or obstruction   . Edema   . Elevated blood pressure reading without diagnosis of hypertension   . GERD (gastroesophageal reflux disease)   . Hemorrhoids   . Hypertension   . Left bundle branch hemiblock   . Lumbago   . Macular degeneration (senile) of retina, unspecified   . Myalgia and myositis, unspecified   . Orthostatic hypotension   . Other abnormal blood chemistry   . Other malaise and fatigue   . Personal history of fall   . Polyneuropathy in other diseases classified elsewhere ([place]) 02/16/2014  . Postmenopausal atrophic vaginitis   . 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 Rectocele   . Restless legs syndrome (RLS)   . Unspecified constipation   . Unspecified hereditary and idiopathic peripheral neuropathy   . Urgency of urination      Past Surgical History:  Procedure Laterality Date  . ABDOMINAL HYSTERECTOMY  1972  . APPENDECTOMY  1945  . CATARACT EXTRACTION Bilateral   . EXTERNAL EAR SURGERY  2010   left outer ear basel cell removed  . TONSILLECTOMY  1940    Family History  Problem Relation Age of Onset  . Aortic stenosis Mother   . Heart failure Mother   . Thrombosis Father   . Coronary artery disease Father   . Ataxia Sister   . Ataxia Brother   . Heart disease Brother   . Cancer Brother     Prostate  . Stroke Brother     Social history:  reports that she has never smoked. She has never used smokeless tobacco. She reports that she does not drink alcohol or use drugs.    Allergies  Allergen Reactions  . Amlodipine Other (See Comments)    Reaction:  Critically decreased sodium and potassium levels.    . Enalapril Other (See Comments)    Reaction:  Unknown   . Hctz [Hydrochlorothiazide] Other (See Comments)    Reaction:  Unknown   . Penicillins Other (See Comments)    Reaction:  Unknown   . Amoxicillin Swelling and Rash    Medications:  Prior to Admission medications   Medication Sig Start Date End Date Taking? Authorizing Provider  bismuth subsalicylate (PEPTO BISMOL) 262 MG chewable tablet Chew 524 mg by mouth. Take two tablets as needed  for loose stools, abdominal cramping    Historical Provider, MD  calcium carbonate (TUMS - DOSED IN MG ELEMENTAL CALCIUM) 500 MG chewable tablet Chew 1 tablet by mouth. Take one after each meal and at bedtime for indigestion or heartburn    Historical Provider, MD  furosemide (LASIX) 20 MG tablet Take 20 mg by mouth daily as needed for edema.  07/29/14   Historical Provider, MD  gabapentin (NEURONTIN) 100 MG capsule TAKE 1 CAPSULE TWICE DAILY 03/26/16   Kathrynn Ducking, MD  labetalol (NORMODYNE) 100 MG tablet Take one tablet by mouth twice daily to control blood pressure 10/21/15   Estill Dooms, MD  LORazepam (ATIVAN) 0.5 MG tablet Take one tablet by  mouth at bedtime as needed for rest 04/18/16   Estill Dooms, MD  Multiple Vitamins-Minerals (ICAPS) CAPS Take 1 capsule by mouth daily.     Historical Provider, MD  NIFEdipine (PROCARDIA-XL/ADALAT CC) 60 MG 24 hr tablet Take one tablet by mouth once daily for blood pressure 10/24/15   Estill Dooms, MD  omeprazole (PRILOSEC OTC) 20 MG tablet Take 20 mg by mouth daily.    Historical Provider, MD  psyllium (METAMUCIL) 58.6 % powder Take 1 packet by mouth daily.     Historical Provider, MD  Ranibizumab (LUCENTIS) 0.3 MG/0.05ML SOLN Inject 1 Dose into the eye every 5 (five) weeks. Pt uses in right eye.    Historical Provider, MD  telmisartan (MICARDIS) 80 MG tablet Take one tablet by mouth once daily for blood pressure 10/21/15   Estill Dooms, MD    ROS:  Out of a complete 14 system review of symptoms, the patient complains only of the following symptoms, and all other reviewed systems are negative.  Loss of vision, blurred vision Incontinence of the bowels Insomnia Numbness, weakness  There were no vitals taken for this visit.  Physical Exam  General: The patient is alert and cooperative at the time of the examination.  Skin: No significant peripheral edema is noted.   Neurologic Exam  Mental status: The patient is alert and oriented x 3 at the time of the examination. The patient has apparent normal recent and remote memory, with an apparently normal attention span and concentration ability.   Cranial nerves: Facial symmetry is present. Speech is normal, no aphasia or dysarthria is noted. Extraocular movements are full. Visual fields are full.  Motor: The patient has good strength in all 4 extremities.  Sensory examination: Soft touch sensation is symmetric on the face, arms, and legs. The patient has a stocking pattern pinprick sensory deficit up to the knees bilaterally.  Coordination: The patient has good finger-nose-finger and heel-to-shin bilaterally.  Gait and station:  The patient has a wide-based gait, the patient walks with a walker. With the walker, the patient has good stride, good turns, good stability. Tandem gait was not attempted. Romberg is negative, but is unsteady. No drift is seen.  Reflexes: Deep tendon reflexes are symmetric, but are depressed.   Assessment/Plan:  1. Peripheral neuropathy  2. Gait instability  3. Restless leg syndrome  The patient will continue the current dose of gabapentin, a prescription was called in for her. She will follow-up in one year, sooner if needed. We discussed potentially adding low-dose Mirapex for the restless leg syndrome, the patient will call me if she desires to go on this medication.  Jill Alexanders MD 05/08/2016 10:01 AM  [facility] [address] [place], [place] [address]  Phone [phone] Fax [phone]  

## 2016-05-24 DIAGNOSIS — H353211 Exudative age-related macular degeneration, right eye, with active choroidal neovascularization: Secondary | ICD-10-CM | POA: Diagnosis not present

## 2016-05-29 DIAGNOSIS — G629 Polyneuropathy, unspecified: Secondary | ICD-10-CM | POA: Diagnosis not present

## 2016-05-29 DIAGNOSIS — I10 Essential (primary) hypertension: Secondary | ICD-10-CM | POA: Diagnosis not present

## 2016-05-29 LAB — HEPATIC FUNCTION PANEL
ALT: 16 U/L (ref 7–35)
AST: 17 U/L (ref 13–35)
Alkaline Phosphatase: 57 U/L (ref 25–125)
Bilirubin, Total: 0.3 mg/dL

## 2016-05-29 LAB — CBC AND DIFFERENTIAL
HCT: 36 % (ref 36–46)
Hemoglobin: 12.4 g/dL (ref 12.0–16.0)
Neutrophils Absolute: 2552 /uL
PLATELETS: 212 10*3/uL (ref 150–399)
WBC: 5.8 10*3/mL

## 2016-05-29 LAB — BASIC METABOLIC PANEL
BUN: 26 mg/dL — AB (ref 4–21)
CREATININE: 1.3 mg/dL — AB (ref 0.5–1.1)
Glucose: 110 mg/dL
Potassium: 4.3 mmol/L (ref 3.4–5.3)
Sodium: 141 mmol/L (ref 137–147)

## 2016-05-29 LAB — TSH: TSH: 2.6 u[IU]/mL (ref 0.41–5.90)

## 2016-05-31 ENCOUNTER — Non-Acute Institutional Stay: Payer: Medicare Other | Admitting: Nurse Practitioner

## 2016-05-31 ENCOUNTER — Encounter: Payer: Self-pay | Admitting: Nurse Practitioner

## 2016-05-31 DIAGNOSIS — K219 Gastro-esophageal reflux disease without esophagitis: Secondary | ICD-10-CM

## 2016-05-31 DIAGNOSIS — I10 Essential (primary) hypertension: Secondary | ICD-10-CM | POA: Diagnosis not present

## 2016-05-31 DIAGNOSIS — G4700 Insomnia, unspecified: Secondary | ICD-10-CM | POA: Diagnosis not present

## 2016-05-31 DIAGNOSIS — N181 Chronic kidney disease, stage 1: Secondary | ICD-10-CM

## 2016-05-31 DIAGNOSIS — G609 Hereditary and idiopathic neuropathy, unspecified: Secondary | ICD-10-CM | POA: Diagnosis not present

## 2016-05-31 DIAGNOSIS — K5900 Constipation, unspecified: Secondary | ICD-10-CM

## 2016-05-31 DIAGNOSIS — F411 Generalized anxiety disorder: Secondary | ICD-10-CM

## 2016-05-31 DIAGNOSIS — N184 Chronic kidney disease, stage 4 (severe): Secondary | ICD-10-CM | POA: Insufficient documentation

## 2016-05-31 DIAGNOSIS — N183 Chronic kidney disease, stage 3 (moderate): Secondary | ICD-10-CM

## 2016-05-31 NOTE — Assessment & Plan Note (Signed)
Stated she had nerve conduction study. Has off and on prickling and burning sensation, spasm, shooting pain. [facility] referral. Didn't desire any pain management.  02/2014 saw Dr Jannifer Franklin: restless leg and neuropathy.  --stable, continue Gabapentin100mg  bid

## 2016-05-31 NOTE — Assessment & Plan Note (Signed)
Stable, decrease Omeprazole to 20mg  qod

## 2016-05-31 NOTE — Assessment & Plan Note (Signed)
Ativan 0.25mg hs  

## 2016-05-31 NOTE — Assessment & Plan Note (Signed)
29/1.3 03/15/15, 26/1.33 05/29/16.  

## 2016-05-31 NOTE — Assessment & Plan Note (Signed)
Stable, continue  Ativan 0.25mg hs  

## 2016-05-31 NOTE — Assessment & Plan Note (Signed)
Loss control sometimes. Diet controlled constipation 

## 2016-05-31 NOTE — Assessment & Plan Note (Signed)
Controlled, continue Nifedipine 60mg , Labetalol 100mg , and Telmisartan 80mg . 05/29/16 Hgb 12.4, Na 141, K 4.3, Bun 26, creat 1.33, TSH 2.6

## 2016-05-31 NOTE — Progress Notes (Signed)
Patient ID: Jacqueline Dodson, female   DOB: 1927/04/13, 82 y.o.   MRN: [id number]   Location:   [place]:  Clinic (12)   Provider: Marlana Latus NP  Code Status: DNR Goals of Care: [place] 05/31/2016  Does patient have an advance directive? Yes  Type of Paramedic of [place];Living will  Does patient want to make changes to advanced directive? No - Patient declined  Copy of advanced directive(s) in chart? Yes  Would patient like information on creating an advanced directive? -     Chief Complaint  Patient presents with  . Medical Management of Chronic Issues    fu/ labs    HPI: Patient is a 82 y.o. female seen today for medical management of chronic diseases.  Hx of HTN, blood pressure is controlled while on Nifedipine 60mg  daily, Micardis 80mg  daily, Labetalol 100mg  bid.  Chronic BLE edema is resolved after Gabapentin down to 100mg  bid, no longer neededFurosemide 20mg  prn, last Bun/creat 29/1.3 03/15/15, 26/1.33 05/29/16. Peripheral neuropathy, pain is managed with Gabapentin 100mg  bid. GERD stable, taking Omeprazole 20mg  qod.  Prn Lorazepam 0.25mg  available to her for night rest, no negative side effects seen.    Past Medical History:  Diagnosis Date  . Abnormality of gait   . Acute, but ill-defined, cerebrovascular disease ([place])   . Anxiety   . Arthritis   . Chronic kidney disease, stage II (mild)   . Cystocele, midline   . Dermatophytosis of the body   . Disturbance of skin sensation   . Dizziness and giddiness   . Duodenal ulcer, unspecified as acute or chronic, without hemorrhage, perforation, or obstruction   . Edema   . Elevated blood pressure reading without diagnosis of hypertension   . GERD (gastroesophageal reflux disease)   . Hemorrhoids   . Hypertension   . Left bundle branch hemiblock   . Lumbago   . Macular degeneration (senile) of retina, unspecified   . Myalgia and myositis, unspecified   . Orthostatic  hypotension   . Other abnormal blood chemistry   . Other malaise and fatigue   . Personal history of fall   . Polyneuropathy in other diseases classified elsewhere ([place]) 02/16/2014  . Postmenopausal atrophic vaginitis   . 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 Rectocele   . Restless legs syndrome (RLS)   . Unspecified constipation   . Unspecified hereditary and idiopathic peripheral neuropathy   . Urgency of urination     Past Surgical History:  Procedure Laterality Date  . ABDOMINAL HYSTERECTOMY  1972  . APPENDECTOMY  1945  . CATARACT EXTRACTION Bilateral   . EXTERNAL EAR SURGERY  2010   left outer ear basel cell removed  . TONSILLECTOMY  1940    Allergies  Allergen Reactions  . Amlodipine Other (See Comments)    Reaction:  Critically decreased sodium and potassium levels.    . Enalapril Other (See Comments)    Reaction:  Unknown   . Hctz [Hydrochlorothiazide] Other (See Comments)    Reaction:  Unknown   . Penicillins Other (See Comments)    Reaction:  Unknown   . Amoxicillin Swelling and Rash      Medication List       Accurate as of 05/31/16  2:10 PM. Always use your most recent med list.          bismuth subsalicylate 99991111 MG chewable tablet Commonly known as:  PEPTO BISMOL Chew 524 mg by mouth.  Take two tablets as needed for loose stools, abdominal cramping   calcium carbonate 500 MG chewable tablet Commonly known as:  TUMS - dosed in mg elemental calcium Chew 1 tablet by mouth. Take one after each meal and at bedtime for indigestion or heartburn   gabapentin 100 MG capsule Commonly known as:  NEURONTIN Take 1 capsule (100 mg total) by mouth 2 (two) times daily.   ICAPS Caps Take 1 capsule by mouth daily.   labetalol 100 MG tablet Commonly known as:  NORMODYNE Take one tablet by mouth twice daily to control blood pressure   LORazepam 0.5 MG tablet Commonly known as:  ATIVAN Take one tablet by mouth at bedtime as needed for rest   LUCENTIS 0.3  MG/0.05ML Soln Generic drug:  Ranibizumab Inject 1 Dose into the eye every 5 (five) weeks. Pt uses in right eye.   NIFEdipine 60 MG 24 hr tablet Commonly known as:  PROCARDIA-XL/ADALAT CC Take one tablet by mouth once daily for blood pressure   omeprazole 20 MG tablet Commonly known as:  PRILOSEC OTC Take 20 mg by mouth every other day.   REFRESH DRY EYE THERAPY OP Apply to eye.   telmisartan 80 MG tablet Commonly known as:  MICARDIS Take one tablet by mouth once daily for blood pressure       Review of Systems:  Review of Systems  Constitutional: Negative for chills, diaphoresis and fever.  HENT: Negative for congestion, ear pain, hearing loss and sore throat.   Eyes: Negative for pain, discharge and redness.  Respiratory: Negative for cough, shortness of breath and wheezing.   Cardiovascular: Positive for leg swelling. Negative for chest pain and palpitations.       BLE resolved  Gastrointestinal: Positive for nausea. Negative for abdominal pain, constipation, diarrhea and vomiting.       Bloated since Gabapentin(Omeprazole helped in the past)  Genitourinary: Positive for frequency. Negative for dysuria, flank pain and urgency.       Cystocele stage III out side her body now. Incontinent B+B sometimes  Musculoskeletal: Positive for back pain (right SIJ-reoslved. ). Negative for myalgias and neck pain.  Skin: Negative for rash.  Neurological: Positive for weakness. Negative for dizziness, tremors, seizures and headaches.       Tingling and burning sensation, muscle spasm, shooting pain in BLE on and off-much better since Gabapentin.  BLE weakness sometimes. Ambulates with walker.   Psychiatric/Behavioral: Negative for hallucinations. The patient is nervous/anxious (better managed with Ativan. ).     Health Maintenance  Topic Date Due  . DEXA SCAN  05/04/1994  . PNA vac Low Risk Adult (2 of 2 - PCV13) 10/09/1995  . TETANUS/TDAP  10/08/2005  . INFLUENZA VACCINE   05/08/2016  . ZOSTAVAX  Completed    Physical Exam: Vitals:   05/31/16 1337  BP: (!) 118/58  Pulse: 72  Resp: 18  Temp: 98.6 F (37 C)  Weight: 151 lb 3.2 oz (68.6 kg)  Height: 5\' 5"  (1.651 m)   Body mass index is 25.16 kg/m. Physical Exam  Constitutional: She is oriented to person, place, and time. She appears well-developed and well-nourished. No distress.  HENT:  Head: Normocephalic and atraumatic.  Eyes: EOM are normal. Pupils are equal, round, and reactive to light. Right eye exhibits no discharge.  Neck: Normal range of motion. Neck supple. No thyromegaly present.  Cardiovascular: Normal rate and regular rhythm.   Murmur heard. 2-3/6 ejection murmurs left sternal border  Pulmonary/Chest: Effort normal and breath  sounds normal. She has no wheezes. She has no rales.  Abdominal: Soft. Bowel sounds are normal. She exhibits no distension. There is no tenderness.  Musculoskeletal: Normal range of motion. She exhibits no edema or tenderness (right SIJ, hip, knee tenderness, resolved. ).  Neurological: She is alert and oriented to person, place, and time. She has normal reflexes. No cranial nerve deficit. She exhibits normal muscle tone. Coordination normal.  Skin: Skin is warm and dry. No rash noted. She is not diaphoretic. No erythema.  Psychiatric: She has a normal mood and affect. Her behavior is normal. Judgment and thought content normal.    Labs reviewed: Basic Metabolic Panel:  Recent Labs  05/29/16  NA 141  K 4.3  BUN 26*  CREATININE 1.3*  TSH 2.60   Liver Function Tests:  Recent Labs  05/29/16  AST 17  ALT 16  ALKPHOS 57   No results for input(s): LIPASE, AMYLASE in the last 8760 hours. No results for input(s): AMMONIA in the last 8760 hours. CBC:  Recent Labs  05/29/16  WBC 5.8  NEUTROABS 2,552  HGB 12.4  HCT 36  PLT 212   Lipid Panel: No results for input(s): CHOL, HDL, LDLCALC, TRIG, CHOLHDL, LDLDIRECT in the last 8760 hours. No results  found for: HGBA1C  Procedures since last visit: No results found.  Assessment/Plan  HTN (hypertension) Controlled, continue Nifedipine 60mg , Labetalol 100mg , and Telmisartan 80mg . 05/29/16 Hgb 12.4, Na 141, K 4.3, Bun 26, creat 1.33, TSH 2.6   Constipation Loss control sometimes. Diet controlled constipation  GERD (gastroesophageal reflux disease) Stable, decrease Omeprazole to 20mg  qod  Peripheral neuropathy Stated she had nerve conduction study. Has off and on prickling and burning sensation, spasm, shooting pain. [facility] referral. Didn't desire any pain management.  02/2014 saw Dr Jannifer Franklin: restless leg and neuropathy.  --stable, continue Gabapentin100mg  bid    Generalized anxiety disorder Stable, continue  Ativan 0.25mg  hs   Insomnia Ativan 0.25mg  hs   Chronic renal disease 29/1.3 03/15/15, 26/1.33 05/29/16.    Labs/tests ordered:  @ORDERS @ CBC, CMP, TSH done 05/29/16 Next appt:  Visit date not found

## 2016-06-07 DIAGNOSIS — Z961 Presence of intraocular lens: Secondary | ICD-10-CM | POA: Diagnosis not present

## 2016-06-07 DIAGNOSIS — H26491 Other secondary cataract, right eye: Secondary | ICD-10-CM | POA: Diagnosis not present

## 2016-07-05 DIAGNOSIS — H353134 Nonexudative age-related macular degeneration, bilateral, advanced atrophic with subfoveal involvement: Secondary | ICD-10-CM | POA: Diagnosis not present

## 2016-07-05 DIAGNOSIS — H353211 Exudative age-related macular degeneration, right eye, with active choroidal neovascularization: Secondary | ICD-10-CM | POA: Diagnosis not present

## 2016-07-05 DIAGNOSIS — H353222 Exudative age-related macular degeneration, left eye, with inactive choroidal neovascularization: Secondary | ICD-10-CM | POA: Diagnosis not present

## 2016-07-10 ENCOUNTER — Other Ambulatory Visit: Payer: Self-pay | Admitting: *Deleted

## 2016-07-10 MED ORDER — LORAZEPAM 0.5 MG PO TABS: ORAL_TABLET | ORAL | 0 refills | Status: DC

## 2016-07-10 NOTE — Telephone Encounter (Signed)
[facility]

## 2016-07-19 DIAGNOSIS — Z23 Encounter for immunization: Secondary | ICD-10-CM | POA: Diagnosis not present

## 2016-08-16 DIAGNOSIS — H353211 Exudative age-related macular degeneration, right eye, with active choroidal neovascularization: Secondary | ICD-10-CM | POA: Diagnosis not present

## 2016-08-28 ENCOUNTER — Ambulatory Visit (INDEPENDENT_AMBULATORY_CARE_PROVIDER_SITE_OTHER): Payer: Medicare Other | Admitting: Family Medicine

## 2016-08-28 VITALS — BP 138/76 | HR 70 | Temp 98.3°F | Resp 16 | Ht 66.0 in | Wt 145.0 lb

## 2016-08-28 DIAGNOSIS — E860 Dehydration: Secondary | ICD-10-CM | POA: Diagnosis not present

## 2016-08-28 DIAGNOSIS — R42 Dizziness and giddiness: Secondary | ICD-10-CM

## 2016-08-28 LAB — COMPLETE METABOLIC PANEL WITH GFR
ALBUMIN: 4.2 g/dL (ref 3.6–5.1)
ALT: 19 U/L (ref 6–29)
AST: 25 U/L (ref 10–35)
Alkaline Phosphatase: 69 U/L (ref 33–130)
BILIRUBIN TOTAL: 0.5 mg/dL (ref 0.2–1.2)
BUN: 13 mg/dL (ref 7–25)
CALCIUM: 9.3 mg/dL (ref 8.6–10.4)
CHLORIDE: 99 mmol/L (ref 98–110)
CO2: 24 mmol/L (ref 20–31)
CREATININE: 1.35 mg/dL — AB (ref 0.60–0.88)
GFR, EST AFRICAN AMERICAN: 41 mL/min — AB (ref >=60)
GFR, Est Non African American: 35 mL/min — ABNORMAL LOW (ref >=60)
Glucose, Bld: 73 mg/dL (ref 65–99)
Potassium: 4.1 mmol/L (ref 3.5–5.3)
Sodium: 133 mmol/L — ABNORMAL LOW (ref 135–146)
TOTAL PROTEIN: 7.1 g/dL (ref 6.1–8.1)

## 2016-08-28 LAB — POCT URINALYSIS DIP (MANUAL ENTRY)
BILIRUBIN UA: NEGATIVE
GLUCOSE UA: NEGATIVE
Ketones, POC UA: NEGATIVE
LEUKOCYTES UA: NEGATIVE
NITRITE UA: NEGATIVE
Protein Ur, POC: NEGATIVE
Spec Grav, UA: 1.01
Urobilinogen, UA: 0.2
pH, UA: 5.5

## 2016-08-28 LAB — POCT CBC
Granulocyte percent: 63 %G (ref 37–80)
HCT, POC: 38.4 % (ref 37.7–47.9)
HEMOGLOBIN: 13.6 g/dL (ref 12.2–16.2)
LYMPH, POC: 2.1 (ref 0.6–3.4)
MCH, POC: 30.7 pg (ref 27–31.2)
MCHC: 35.3 g/dL (ref 31.8–35.4)
MCV: 87.1 fL (ref 80–97)
MID (CBC): 0.6 (ref 0–0.9)
MPV: 6.4 fL (ref 0–99.8)
PLATELET COUNT, POC: 247 10*3/uL (ref 142–424)
POC Granulocyte: 4.5 (ref 2–6.9)
POC LYMPH PERCENT: 28.8 %L (ref 10–50)
POC MID %: 8.2 % (ref 0–12)
RBC: 4.41 M/uL (ref 4.04–5.48)
RDW, POC: 11.7 %
WBC: 7.2 10*3/uL (ref 4.6–10.2)

## 2016-08-28 LAB — POC MICROSCOPIC URINALYSIS (UMFC): Mucus: ABSENT

## 2016-08-28 LAB — GLUCOSE, POCT (MANUAL RESULT ENTRY): POC Glucose: 70 mg/dl (ref 70–99)

## 2016-08-28 NOTE — Patient Instructions (Addendum)
Blood sugar is low. This may account for your flushing feeling and some of the morning dizziness. Increase intake of food higher in carbs such as fruits, nuts, and peanut butter. Increase take of water, Gatorade, and 6 oz of juice.  Return for follow-up if symptoms of dehydration and dizziness persists. I will follow-up with you regarding your lab results.  Carroll Sage. Lavell Supple, MSN, FNP-C [facility]   Hypoglycemia  Hypoglycemia is when the sugar (glucose) level in the blood is too low. Symptoms of low blood sugar may include:  Feeling:  Hungry.  Worried or nervous (anxious).  Sweaty and clammy.  Confused.  Dizzy.  Sleepy.  Sick to your stomach (nauseous).  Having:  A fast heartbeat.  A headache.  A change in your vision.  Jerky movements that you cannot control (seizure).  Nightmares.  Tingling or no feeling (numbness) around the mouth, lips, or tongue.  Having trouble with:  Talking.  Paying attention (concentrating).  Moving (coordination).  Sleeping.  Shaking.  Passing out (fainting).  Getting upset easily (irritability). Low blood sugar can happen to people who have diabetes and people who do not have diabetes. Low blood sugar can happen quickly, and it can be an emergency. Treating Low Blood Sugar  Low blood sugar is often treated by eating or drinking something sugary right away. If you can think clearly and swallow safely, follow the 15:15 rule:  Take 15 grams of a fast-acting carb (carbohydrate). Some fast-acting carbs are:  1 tube of glucose gel.  3 sugar tablets (glucose pills).  6-8 pieces of hard candy.  4 oz (120 mL) of fruit juice.  4 oz (120 mL) of regular (not diet) soda.  Check your blood sugar 15 minutes after you take the carb.  If your blood sugar is still at or below 70 mg/dL (3.9 mmol/L), take 15 grams of a carb again.  If your blood sugar does not go above 70 mg/dL (3.9  mmol/L) after 3 tries, get help right away.  After your blood sugar goes back to normal, eat a meal or a snack within 1 hour. Treating Very Low Blood Sugar  If your blood sugar is at or below 54 mg/dL (3 mmol/L), you have very low blood sugar (severe hypoglycemia). This is an emergency. Do not wait to see if the symptoms will go away. Get medical help right away. Call your local emergency services (911 in the [place] Do not drive yourself to the hospital. If you have very low blood sugar and you cannot eat or drink, you may need a glucagon shot (injection). A family member or friend should learn how to check your blood sugar and how to give you a glucagon shot. Ask your doctor if you need to have a glucagon shot kit at home. Follow these instructions at home: General instructions  Avoid any diets that cause you to not eat enough food. Talk with your doctor before you start any new diet.  Take over-the-counter and prescription medicines only as told by your doctor.  Limit alcohol to no more than 1 drink per day for nonpregnant women and 2 drinks per day for men. One drink equals 12 oz of beer, 5 oz of wine, or 1 oz of hard liquor.  Keep all follow-up visits as told by your doctor. This is important. If You Have Diabetes:   Make sure you know the symptoms of low blood sugar.  Always keep a source of  sugar with you, such as:  Sugar.  Sugar tablets.  Glucose gel.  Fruit juice.  Regular soda (not diet soda).  Milk.  Hard candy.  Honey.  Take your medicines as told.  Follow your exercise and meal plan.  Eat on time. Do not skip meals.  Follow your sick day plan when you cannot eat or drink normally. Make this plan ahead of time with your doctor.  Check your blood sugar as often as told by your doctor. Always check before and after exercise.  Share your diabetes care plan with:  Your work or school.  People you live with.  Check your pee (urine) for ketones:  When you  are sick.  As told by your doctor.  Carry a card or wear jewelry that says you have diabetes. If You Have Low Blood Sugar From Other Causes:   Check your blood sugar as often as told by your doctor.  Follow instructions from your doctor about what you cannot eat or drink. Contact a doctor if:  You have trouble keeping your blood sugar in your target range.  You have low blood sugar often. Get help right away if:  You still have symptoms after you eat or drink something sugary.  Your blood sugar is at or below 54 mg/dL (3 mmol/L).  You have jerky movements that you cannot control.  You pass out. These symptoms may be an emergency. Do not wait to see if the symptoms will go away. Get medical help right away. Call your local emergency services (911 in the [place] Do not drive yourself to the hospital.  This information is not intended to replace advice given to you by your health care provider. Make sure you discuss any questions you have with your health care provider. Document Released: 12/19/2009 Document Revised: 03/01/2016 Document Reviewed: 10/28/2015 Elsevier Interactive Patient Education  2017 [place].  Dehydration Dehydration is when there is not enough fluid or water in your body. This happens when you lose more fluids than you take in. People who are age 68 or older have a higher risk of getting dehydrated. Dehydration can range from mild to very bad. It should be treated right away to keep it from getting very bad. Symptoms of mild dehydration may include:  Thirst.  Dry lips.  Slightly dry mouth.  Dry, warm skin.  Dizziness. Symptoms of moderate dehydration may include:  Very dry mouth.  Muscle cramps.  Dark pee (urine). Pee may be the color of tea.  Your body making less pee.  Your eyes making fewer tears.  Heartbeat that is uneven or faster than normal (palpitations).  Headache.  Light-headedness, especially when you stand up from  sitting.  Fainting (syncope). Symptoms of very bad dehydration may include:  Changes in skin, such as:  Cold and clammy skin.  Blotchy (mottled) or pale skin.  Skin that does not quickly return to normal after being lightly pinched and let go (poor skin turgor).  Changes in body fluids, such as:  Feeling very thirsty.  Your eyes making fewer tears.  Not sweating when body temperature is high, such as in hot weather.  Your body making very little pee.  Changes in vital signs, such as:  Weak pulse.  Pulse that is more than 100 beats a minute when you are sitting still.  Fast breathing.  Low blood pressure.  Other changes, such as:  Sunken eyes.  Cold hands and feet.  Confusion.  Lack of energy (lethargy).  Trouble  waking up from sleep.  Short-term weight loss.  Unconsciousness. Follow these instructions at home:  If told by your doctor, drink an ORS:  Make an ORS by using instructions on the package.  Start by drinking small amounts, about  cup (120 mL) every 5-10 minutes.  Slowly drink more until you have had the amount that your doctor said to have.  Drink enough clear fluid to keep your pee clear or pale yellow. If you were told to drink an ORS, finish the ORS first, then start slowly drinking clear fluids. Drink fluids such as:  Water. Do not drink only water by itself. Doing that can make the salt (sodium) level in your body get too low (hyponatremia).  Ice chips.  Fruit juice that you have added water to (diluted).  Low-calorie sports drinks.  Avoid:  Alcohol.  Drinks that have a lot of sugar. These include high-calorie sports drinks, fruit juice that does not have water added, and soda.  Caffeine.  Foods that are greasy or have a lot of fat or sugar.  Take over-the-counter and prescription medicines only as told by your doctor.  Do not take salt tablets. Doing that can make the salt level in your body get too high  (hypernatremia).  Eat foods that have minerals (electrolytes). Examples include bananas, oranges, potatoes, tomatoes, and spinach.  Keep all follow-up visits as told by your doctor. This is important. Contact a doctor if:  You have belly (abdominal) pain that:  Gets worse.  Stays in one area (localizes).  You have a rash.  You have a stiff neck.  You get angry or annoyed more easily than normal (irritability).  You are more sleepy than normal.  You have a harder time waking up than normal.  You feel:  Weak.  Dizzy.  Very thirsty. Get help right away if:  You have symptoms of very bad dehydration.  You cannot drink fluids without throwing up (vomiting).  Your symptoms get worse with treatment.  You have a fever.  You have a very bad headache.  You are throwing up or having watery poop (diarrhea) and it:  Gets worse.  Does not go away.  You have diarrhea for more than 24 hours.  You have blood or something green (bile) in your throw-up.  You have blood in your poop (stool). This may cause poop to look black and tarry.  You have not peed in 6-8 hours.  You have peed (urinated) only a small amount of very dark pee during 6-8 hours.  You pass out (faint).  Your heart rate when you are sitting still is more than 100 beats a minute.  You have trouble breathing. This information is not intended to replace advice given to you by your health care provider. Make sure you discuss any questions you have with your health care provider. Document Released: 09/13/2011 Document Revised: 04/13/2016 Document Reviewed: 11/18/2015 Elsevier Interactive Patient Education  2017 [employer].

## 2016-08-28 NOTE — Progress Notes (Signed)
Patient ID: Calianne Larue, female    DOB: 1927-06-14, 82 y.o.   MRN: [id number]  PCP: Jeanmarie Hubert, MD  Chief Complaint  Patient presents with  . Dizziness    x 1 day  . DEHYDRATED    x 1 day   . lack of energy    x 1 day     Subjective:   HPI 82 year old female presents for evaluation of dizziness, dehydration, and decreased energy times 1 day. Reports last weekend experiencing vomiting and diarrhea on Saturday with abdominal pain. Illness resolved 3 days ago. Reports on yesterday she changes in position while in bed and experienced some dizziness. When up and walking around not bothered by dizziness. Less energy than normal and feels as if her face is flush. Drinking tea, Gatorade, and water. Eating crackers and oatmeal. Reports urinating ok feel it is less than usual.   Social History   Social History  . Marital status: Widowed    Spouse name: N/A  . Number of children: 5  . Years of education: college   Occupational History  . Retired    Social History Main Topics  . Smoking status: Never Smoker  . Smokeless tobacco: Never Used  . Alcohol use No  . Drug use: No  . Sexual activity: No   Other Topics Concern  . Not on file   Social History Narrative   Patient is widowed, with 5 children.   Patient is right handed.   Patient has college education.   Patient does not drink caffeine.   Family History  Problem Relation Age of Onset  . Aortic stenosis Mother   . Heart failure Mother   . Thrombosis Father   . Coronary artery disease Father   . Ataxia Sister   . Ataxia Brother   . Heart disease Brother   . Cancer Brother     Prostate  . Stroke Brother    Review of Systems See HPI   Patient Active Problem List   Diagnosis Date Noted  . Chronic renal disease 05/31/2016  . Incontinent of urine 12/29/2015  . Bowel incontinence 12/29/2015  . Lightheadedness 03/27/2015  . Dysphagia 12/16/2014  . GERD (gastroesophageal reflux disease) 09/09/2014  . Edema  09/09/2014  . Polyneuropathy in other diseases classified elsewhere ([place]) 02/16/2014  . Abnormality of gait 02/16/2014  . Restless legs syndrome (RLS) 02/16/2014  . Constipation 12/20/2013  . Peripheral neuropathy ([place]) 12/20/2013  . Rectal mucosa prolapse 04/13/2013  . Osteoarthritis of right knee 01/15/2013  . Osteoarthritis of right hip 01/15/2013  . HTN (hypertension) 01/15/2013  . Insomnia 01/15/2013  . Generalized anxiety disorder 01/15/2013  . Bladder cystocele 01/15/2013     Prior to Admission medications   Medication Sig Start Date End Date Taking? Authorizing Provider  bismuth subsalicylate (PEPTO BISMOL) 262 MG chewable tablet Chew 524 mg by mouth. Take two tablets as needed for loose stools, abdominal cramping   Yes Historical Provider, MD  calcium carbonate (TUMS - DOSED IN MG ELEMENTAL CALCIUM) 500 MG chewable tablet Chew 1 tablet by mouth. Take one after each meal and at bedtime for indigestion or heartburn   Yes Historical Provider, MD  gabapentin (NEURONTIN) 100 MG capsule Take 1 capsule (100 mg total) by mouth 2 (two) times daily. 05/08/16  Yes Kathrynn Ducking, MD  Glycerin-Polysorbate 80 (REFRESH DRY EYE THERAPY OP) Apply to eye.   Yes Historical Provider, MD  labetalol (NORMODYNE) 100 MG tablet Take one tablet by mouth twice daily  to control blood pressure 10/21/15  Yes Estill Dooms, MD  LORazepam (ATIVAN) 0.5 MG tablet Take one tablet by mouth at bedtime as needed for rest 07/10/16  Yes Estill Dooms, MD  Multiple Vitamins-Minerals (ICAPS) CAPS Take 1 capsule by mouth daily.    Yes Historical Provider, MD  NIFEdipine (PROCARDIA-XL/ADALAT CC) 60 MG 24 hr tablet Take one tablet by mouth once daily for blood pressure 10/24/15  Yes Estill Dooms, MD  omeprazole (PRILOSEC OTC) 20 MG tablet Take 20 mg by mouth every other day.    Yes Historical Provider, MD  Ranibizumab (LUCENTIS) 0.3 MG/0.05ML SOLN Inject 1 Dose into the eye every 5 (five) weeks. Pt uses in right eye.   Yes  Historical Provider, MD  telmisartan (MICARDIS) 80 MG tablet Take one tablet by mouth once daily for blood pressure 10/21/15  Yes Estill Dooms, MD    Allergies  Allergen Reactions  . Amlodipine Other (See Comments)    Reaction:  Critically decreased sodium and potassium levels.    . Enalapril Other (See Comments)    Reaction:  Unknown   . Hctz [Hydrochlorothiazide] Other (See Comments)    Reaction:  Unknown   . Penicillins Other (See Comments)    Reaction:  Unknown   . Amoxicillin Swelling and Rash     Objective:  Physical Exam  Constitutional: She is oriented to person, place, and time. She appears well-developed and well-nourished.  HENT:  Head: Normocephalic and atraumatic.  Right Ear: External ear normal.  Left Ear: External ear normal.  Eyes: Conjunctivae are normal. Pupils are equal, round, and reactive to light.  Neck: Normal range of motion. Neck supple.  Cardiovascular: Normal rate, regular rhythm, normal heart sounds and intact distal pulses.   Pulmonary/Chest: Effort normal and breath sounds normal.  Musculoskeletal: Normal range of motion.  Neurological: She is alert and oriented to person, place, and time.  Skin: Skin is warm and dry.  Psychiatric: She has a normal mood and affect. Her behavior is normal. Judgment and thought content normal.     Vitals:   08/28/16 1509  BP: 138/76  Pulse: 70  Resp: 16  Temp: 98.3 F (36.8 C)   Assessment & Plan:  1. Dehydration - POCT urinalysis dipstick - POCT Microscopic Urinalysis  - POCT CBC  2. Dizziness - POCT urinalysis dipstick - POCT Microscopic Urinalysis  - POCT glucose  - COMPLETE METABOLIC PANEL WITH GFR  82 year old female presents with a complaint of dizziness, fatigue, sensation of feeling flushed, and questionable dehydration following an acute GI illness that resolved without treatment. CBC and urinalysis were unremarkable. Glucose revealed a very low-normal level of 70 and patient admits to very  diminished appetite over the last few days. CMETpending. Patient advised to advance food as tolerated and include more complex carbohydrates to increase blood glucose level which likely the cause of dizziness.  Will follow-up regarding labs. Follow-up as needed.  Carroll Sage. [name], MSN, FNP-C [facility]

## 2016-08-29 ENCOUNTER — Telehealth: Payer: Self-pay | Admitting: Family Medicine

## 2016-08-29 NOTE — Telephone Encounter (Signed)
Called patient regarding lab results. Encouraged to rehydrate and follow-up as needed with her primary care provider.  Jacqueline Dodson. [name], MSN, FNP-C [facility]

## 2016-09-18 DIAGNOSIS — N182 Chronic kidney disease, stage 2 (mild): Secondary | ICD-10-CM | POA: Diagnosis not present

## 2016-09-18 DIAGNOSIS — N2581 Secondary hyperparathyroidism of renal origin: Secondary | ICD-10-CM | POA: Diagnosis not present

## 2016-09-20 DIAGNOSIS — H353222 Exudative age-related macular degeneration, left eye, with inactive choroidal neovascularization: Secondary | ICD-10-CM | POA: Diagnosis not present

## 2016-09-20 DIAGNOSIS — H353211 Exudative age-related macular degeneration, right eye, with active choroidal neovascularization: Secondary | ICD-10-CM | POA: Diagnosis not present

## 2016-09-25 DIAGNOSIS — I129 Hypertensive chronic kidney disease with stage 1 through stage 4 chronic kidney disease, or unspecified chronic kidney disease: Secondary | ICD-10-CM | POA: Diagnosis not present

## 2016-09-25 DIAGNOSIS — N2581 Secondary hyperparathyroidism of renal origin: Secondary | ICD-10-CM | POA: Diagnosis not present

## 2016-09-25 DIAGNOSIS — N182 Chronic kidney disease, stage 2 (mild): Secondary | ICD-10-CM | POA: Diagnosis not present

## 2016-10-04 ENCOUNTER — Other Ambulatory Visit: Payer: Self-pay | Admitting: Internal Medicine

## 2016-10-16 ENCOUNTER — Other Ambulatory Visit: Payer: Self-pay | Admitting: *Deleted

## 2016-10-16 MED ORDER — LABETALOL HCL 100 MG PO TABS: ORAL_TABLET | ORAL | 3 refills | Status: DC

## 2016-10-29 ENCOUNTER — Other Ambulatory Visit: Payer: Self-pay | Admitting: *Deleted

## 2016-10-29 MED ORDER — LORAZEPAM 0.5 MG PO TABS: ORAL_TABLET | ORAL | 0 refills | Status: DC

## 2016-10-29 NOTE — Telephone Encounter (Signed)
[facility]

## 2016-11-01 DIAGNOSIS — H353211 Exudative age-related macular degeneration, right eye, with active choroidal neovascularization: Secondary | ICD-10-CM | POA: Diagnosis not present

## 2016-11-29 ENCOUNTER — Encounter: Payer: Self-pay | Admitting: Nurse Practitioner

## 2016-12-13 DIAGNOSIS — H353211 Exudative age-related macular degeneration, right eye, with active choroidal neovascularization: Secondary | ICD-10-CM | POA: Diagnosis not present

## 2016-12-20 ENCOUNTER — Other Ambulatory Visit: Payer: Self-pay

## 2016-12-20 MED ORDER — LORAZEPAM 0.5 MG PO TABS: ORAL_TABLET | ORAL | 0 refills | Status: DC

## 2017-01-19 ENCOUNTER — Other Ambulatory Visit: Payer: Self-pay | Admitting: Internal Medicine

## 2017-01-22 DIAGNOSIS — H353134 Nonexudative age-related macular degeneration, bilateral, advanced atrophic with subfoveal involvement: Secondary | ICD-10-CM | POA: Diagnosis not present

## 2017-01-22 DIAGNOSIS — H353211 Exudative age-related macular degeneration, right eye, with active choroidal neovascularization: Secondary | ICD-10-CM | POA: Diagnosis not present

## 2017-02-07 ENCOUNTER — Encounter: Payer: Self-pay | Admitting: Nurse Practitioner

## 2017-02-07 ENCOUNTER — Non-Acute Institutional Stay: Payer: Medicare Other | Admitting: Nurse Practitioner

## 2017-02-07 VITALS — BP 118/80 | HR 78 | Temp 98.5°F | Resp 20 | Ht 66.0 in | Wt 150.4 lb

## 2017-02-07 DIAGNOSIS — K219 Gastro-esophageal reflux disease without esophagitis: Secondary | ICD-10-CM | POA: Diagnosis not present

## 2017-02-07 DIAGNOSIS — I10 Essential (primary) hypertension: Secondary | ICD-10-CM

## 2017-02-07 DIAGNOSIS — R159 Full incontinence of feces: Secondary | ICD-10-CM

## 2017-02-07 DIAGNOSIS — N3942 Incontinence without sensory awareness: Secondary | ICD-10-CM

## 2017-02-07 DIAGNOSIS — Z1329 Encounter for screening for other suspected endocrine disorder: Secondary | ICD-10-CM

## 2017-02-07 DIAGNOSIS — G63 Polyneuropathy in diseases classified elsewhere: Secondary | ICD-10-CM

## 2017-02-07 DIAGNOSIS — R42 Dizziness and giddiness: Secondary | ICD-10-CM

## 2017-02-07 DIAGNOSIS — G609 Hereditary and idiopathic neuropathy, unspecified: Secondary | ICD-10-CM

## 2017-02-07 DIAGNOSIS — G4700 Insomnia, unspecified: Secondary | ICD-10-CM | POA: Diagnosis not present

## 2017-02-07 DIAGNOSIS — R7989 Other specified abnormal findings of blood chemistry: Secondary | ICD-10-CM

## 2017-02-07 DIAGNOSIS — K5900 Constipation, unspecified: Secondary | ICD-10-CM

## 2017-02-07 DIAGNOSIS — R946 Abnormal results of thyroid function studies: Secondary | ICD-10-CM

## 2017-02-07 DIAGNOSIS — E538 Deficiency of other specified B group vitamins: Secondary | ICD-10-CM | POA: Diagnosis not present

## 2017-02-07 DIAGNOSIS — F411 Generalized anxiety disorder: Secondary | ICD-10-CM | POA: Diagnosis not present

## 2017-02-07 NOTE — Assessment & Plan Note (Signed)
Ativan 0.25mg  hs

## 2017-02-07 NOTE — Assessment & Plan Note (Signed)
Loss control sometimes. Diet controlled constipation

## 2017-02-07 NOTE — Assessment & Plan Note (Addendum)
Controlled, continue Nifedipine 60mg , Labetalol 100mg , and Telmisartan 80mg . Update CBC CMP TSH UA C/S

## 2017-02-07 NOTE — Assessment & Plan Note (Signed)
Stable, decreased Omeprazole to 20mg  qod

## 2017-02-07 NOTE — Assessment & Plan Note (Signed)
Stable, continue  Ativan 0.25mg  hs

## 2017-02-07 NOTE — Assessment & Plan Note (Signed)
Loss control sometimes. Recommend diet fiber for bulking stools.

## 2017-02-07 NOTE — Progress Notes (Signed)
Patient ID: Jacqueline Dodson, female   DOB: 1927/01/25, 83 y.o.   MRN: [id number]   Location:   [facility]:  Clinic (12)   Provider: Marlana Latus NP  Code Status: DNR Goals of Care: [place] Advanced Directives 02/07/2017  Does Patient Have a Medical Advance Directive? Yes  Type of Paramedic of [place];Living will  Does patient want to make changes to medical advance directive? No - Patient declined  Copy of [place] in Chart? Yes  Would patient like information on creating a medical advance directive? -     Chief Complaint  Patient presents with  . Medical Management of Chronic Issues    HPI: Patient is a 83 y.o. female seen today for medical management of chronic diseases.   c/o positional vertigo at time, better w/o intervention, not associated with HA, change of vision, focal weakness, denied chest pain or palpitation.     Hx of HTN, blood pressure is controlled while on Nifedipine 60mg  daily, Micardis 80mg  daily, Labetalol 100mg  bid.  Chronic BLE edema is resolved after Gabapentin down to 100mg  bid, last Bun/creat 29/1.3 03/15/15, 26/1.33 05/29/16. Peripheral neuropathy, pain is managed with Gabapentin 100mg  bid. GERD stable, taking Omeprazole 20mg  qod.  Prn Lorazepam 0.25mg  available to her for night rest, no negative side effects seen.    Past Medical History:  Diagnosis Date  . Abnormality of gait   . Acute, but ill-defined, cerebrovascular disease   . Anxiety   . Arthritis   . Chronic kidney disease, stage II (mild)   . Cystocele, midline   . Dermatophytosis of the body   . Disturbance of skin sensation   . Dizziness and giddiness   . Duodenal ulcer, unspecified as acute or chronic, without hemorrhage, perforation, or obstruction   . Edema   . Elevated blood pressure reading without diagnosis of hypertension   . GERD (gastroesophageal reflux disease)   . Hemorrhoids   . Hypertension   . Left bundle branch hemiblock   .  Lumbago   . Macular degeneration (senile) of retina, unspecified   . Myalgia and myositis, unspecified   . Orthostatic hypotension   . Other abnormal blood chemistry   . Other malaise and fatigue   . Personal history of fall   . Polyneuropathy in other diseases classified elsewhere ([place]) 02/16/2014  . Postmenopausal atrophic vaginitis   . 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 Rectocele   . Restless legs syndrome (RLS)   . Unspecified constipation   . Unspecified hereditary and idiopathic peripheral neuropathy   . Urgency of urination     Past Surgical History:  Procedure Laterality Date  . ABDOMINAL HYSTERECTOMY  1972  . APPENDECTOMY  1945  . CATARACT EXTRACTION Bilateral   . EXTERNAL EAR SURGERY  2010   left outer ear basel cell removed  . TONSILLECTOMY  1940    Allergies  Allergen Reactions  . Amlodipine Other (See Comments)    Reaction:  Critically decreased sodium and potassium levels.    . Enalapril Other (See Comments)    Reaction:  Unknown   . Hctz [Hydrochlorothiazide] Other (See Comments)    Reaction:  Unknown   . Penicillins Other (See Comments)    Reaction:  Unknown   . Amoxicillin Swelling and Rash    Allergies as of 02/07/2017      Reactions   Amlodipine Other (See Comments)   Reaction:  Critically decreased sodium and potassium levels.  Enalapril Other (See Comments)   Reaction:  Unknown    Hctz [hydrochlorothiazide] Other (See Comments)   Reaction:  Unknown    Penicillins Other (See Comments)   Reaction:  Unknown    Amoxicillin Swelling, Rash      Medication List       Accurate as of 02/07/17  5:55 PM. Always use your most recent med list.          AVASTIN 100 MG/4ML Soln Generic drug:  Bevacizumab Inject into the vein every 5 (five) weeks. Injection into right eye.   bismuth subsalicylate 250 MG chewable tablet Commonly known as:  PEPTO BISMOL Chew 524 mg by mouth. Take two tablets as needed for loose stools, abdominal cramping     calcium carbonate 500 MG chewable tablet Commonly known as:  TUMS - dosed in mg elemental calcium Chew 1 tablet by mouth. Take one after each meal and at bedtime for indigestion or heartburn   gabapentin 100 MG capsule Commonly known as:  NEURONTIN Take 1 capsule (100 mg total) by mouth 2 (two) times daily.   ICAPS Caps Take 1 capsule by mouth daily.   labetalol 100 MG tablet Commonly known as:  NORMODYNE Take one tablet by mouth twice daily to control blood pressure   LORazepam 0.5 MG tablet Commonly known as:  ATIVAN APPOINTMENT DUE, Take one tablet by mouth at bedtime as needed for rest   LUCENTIS 0.3 MG/0.05ML Soln Generic drug:  Ranibizumab Inject 1 Dose into the eye every 5 (five) weeks. Pt uses in right eye.   NIFEdipine 60 MG 24 hr tablet Commonly known as:  PROCARDIA XL/ADALAT-CC Patient needs appointment for additional refills. Take 1 tablet by mouth daily for blood pressure.   omeprazole 20 MG tablet Commonly known as:  PRILOSEC OTC Take 20 mg by mouth every other day.   telmisartan 80 MG tablet Commonly known as:  MICARDIS TAKE 1 TABLET ONCE DAILY   FOR BLOOD PRESSURE       Review of Systems:  Review of Systems  Constitutional: Negative for chills, diaphoresis and fever.  HENT: Negative for congestion, ear pain, hearing loss and sore throat.   Eyes: Negative for pain, discharge and redness.  Respiratory: Negative for cough, shortness of breath and wheezing.   Cardiovascular: Positive for leg swelling. Negative for chest pain and palpitations.       BLE resolved  Gastrointestinal: Positive for nausea. Negative for abdominal pain, constipation, diarrhea and vomiting.       Bloated since Gabapentin(Omeprazole helped in the past)  Genitourinary: Positive for frequency. Negative for dysuria, flank pain and urgency.       Cystocele stage III out side her body now. Incontinent B+B sometimes  Musculoskeletal: Positive for back pain (right SIJ-reoslved. ).  Negative for myalgias and neck pain.  Skin: Negative for rash.  Neurological: Positive for dizziness, weakness and light-headedness. Negative for tremors, seizures and headaches.       Tingling and burning sensation, muscle spasm, shooting pain in BLE on and off-much better since Gabapentin.  BLE weakness sometimes. Ambulates with walker.   Psychiatric/Behavioral: Negative for hallucinations. The patient is nervous/anxious (better managed with Ativan. ).     Health Maintenance  Topic Date Due  . DEXA SCAN  05/04/1994  . PNA vac Low Risk Adult (2 of 2 - PCV13) 10/09/1995  . TETANUS/TDAP  10/08/2005  . INFLUENZA VACCINE  05/08/2017    Physical Exam: Vitals:   02/07/17 1351  BP: 118/80  Pulse: 78  Resp: 20  Temp: 98.5 F (36.9 C)  TempSrc: Oral  SpO2: 97%  Weight: 150 lb 6.4 oz (68.2 kg)  Height: 5\' 6"  (1.676 m)   Body mass index is 24.28 kg/m. Physical Exam  Constitutional: She is oriented to person, place, and time. She appears well-developed and well-nourished. No distress.  HENT:  Head: Normocephalic and atraumatic.  Eyes: EOM are normal. Pupils are equal, round, and reactive to light. Right eye exhibits no discharge.  Neck: Normal range of motion. Neck supple. No thyromegaly present.  Cardiovascular: Normal rate and regular rhythm.   Murmur heard. 2-3/6 ejection murmurs left sternal border  Pulmonary/Chest: Effort normal and breath sounds normal. She has no wheezes. She has no rales.  Abdominal: Soft. Bowel sounds are normal. She exhibits no distension. There is no tenderness.  Musculoskeletal: Normal range of motion. She exhibits no edema or tenderness (right SIJ, hip, knee tenderness, resolved. ).  Neurological: She is alert and oriented to person, place, and time. She has normal reflexes. No cranial nerve deficit. She exhibits normal muscle tone. Coordination normal.  Skin: Skin is warm and dry. No rash noted. She is not diaphoretic. No erythema.  Psychiatric: She  has a normal mood and affect. Her behavior is normal. Judgment and thought content normal.    Labs reviewed: Basic Metabolic Panel:  Recent Labs  05/29/16 08/28/16 1511  NA 141 133*  K 4.3 4.1  CL  --  99  CO2  --  24  GLUCOSE  --  73  BUN 26* 13  CREATININE 1.3* 1.35*  CALCIUM  --  9.3  TSH 2.60  --    Liver Function Tests:  Recent Labs  05/29/16 08/28/16 1511  AST 17 25  ALT 16 19  ALKPHOS 57 69  BILITOT  --  0.5  PROT  --  7.1  ALBUMIN  --  4.2   No results for input(s): LIPASE, AMYLASE in the last 8760 hours. No results for input(s): AMMONIA in the last 8760 hours. CBC:  Recent Labs  05/29/16 08/28/16 1558  WBC 5.8 7.2  NEUTROABS 2,552  --   HGB 12.4 13.6  HCT 36 38.4  MCV  --  87.1  PLT 212  --    Lipid Panel: No results for input(s): CHOL, HDL, LDLCALC, TRIG, CHOLHDL, LDLDIRECT in the last 8760 hours. No results found for: HGBA1C  Procedures since last visit: No results found.  Assessment/Plan  HTN (hypertension) Controlled, continue Nifedipine 60mg , Labetalol 100mg , and Telmisartan 80mg . Update CBC CMP TSH UA C/S  Lightheadedness Positional, PT to eval and tx. CBC CMP TSH Vit B12 UA C/S  Bowel incontinence Loss control sometimes. Recommend diet fiber for bulking stools.   Constipation Loss control sometimes. Diet controlled constipation  GERD (gastroesophageal reflux disease) Stable, decreased Omeprazole to 20mg  qod  Polyneuropathy in other diseases classified elsewhere Stated she had nerve conduction study. Has off and on prickling and burning sensation, spasm, shooting pain. [facility] referral. Didn't desire any pain management.  02/2014 saw Dr Jannifer Franklin: restless leg and neuropathy.  --stable, continue Gabapentin100mg  bid -update Vit B12   Generalized anxiety disorder Stable, continue  Ativan 0.25mg  hs   Insomnia Ativan 0.25mg  hs   Incontinent of urine Update UA C/S  Peripheral neuropathy Continue Gabapentin 100mg   bid, obtain Vit B12   Labs/tests ordered: CBC CMP TSH UA C/S Vit B12  Next appt:  6 months

## 2017-02-07 NOTE — Assessment & Plan Note (Signed)
Continue Gabapentin 100mg  bid, obtain Vit B12

## 2017-02-07 NOTE — Assessment & Plan Note (Signed)
Update UA C/S

## 2017-02-07 NOTE — Assessment & Plan Note (Addendum)
Positional, PT to eval and tx. CBC CMP TSH Vit B12 UA C/S

## 2017-02-07 NOTE — Assessment & Plan Note (Addendum)
Stated she had nerve conduction study. Has off and on prickling and burning sensation, spasm, shooting pain. [facility] referral. Didn't desire any pain management.  02/2014 saw Dr Jannifer Franklin: restless leg and neuropathy.  --stable, continue Gabapentin100mg  bid -update Vit B12

## 2017-02-09 ENCOUNTER — Other Ambulatory Visit: Payer: Self-pay | Admitting: Internal Medicine

## 2017-02-11 ENCOUNTER — Other Ambulatory Visit: Payer: Self-pay

## 2017-02-11 ENCOUNTER — Other Ambulatory Visit: Payer: Self-pay | Admitting: *Deleted

## 2017-02-11 DIAGNOSIS — E538 Deficiency of other specified B group vitamins: Secondary | ICD-10-CM

## 2017-02-11 DIAGNOSIS — G4700 Insomnia, unspecified: Secondary | ICD-10-CM

## 2017-02-11 DIAGNOSIS — N3942 Incontinence without sensory awareness: Secondary | ICD-10-CM

## 2017-02-11 DIAGNOSIS — I10 Essential (primary) hypertension: Secondary | ICD-10-CM | POA: Diagnosis not present

## 2017-02-11 DIAGNOSIS — G609 Hereditary and idiopathic neuropathy, unspecified: Secondary | ICD-10-CM | POA: Diagnosis not present

## 2017-02-11 NOTE — Addendum Note (Signed)
Addended by: Eilene Ghazi on: 02/11/2017 10:31 AM   Modules accepted: Orders

## 2017-02-12 LAB — COMPREHENSIVE METABOLIC PANEL
ALT: 14 U/L (ref 6–29)
AST: 18 U/L (ref 10–35)
Albumin: 3.9 g/dL (ref 3.6–5.1)
Alkaline Phosphatase: 55 U/L (ref 33–130)
BUN: 26 mg/dL — AB (ref 7–25)
CHLORIDE: 106 mmol/L (ref 98–110)
CO2: 24 mmol/L (ref 20–31)
CREATININE: 1.29 mg/dL — AB (ref 0.60–0.88)
Calcium: 9.2 mg/dL (ref 8.6–10.4)
Glucose, Bld: 106 mg/dL — ABNORMAL HIGH (ref 65–99)
POTASSIUM: 4.3 mmol/L (ref 3.5–5.3)
SODIUM: 140 mmol/L (ref 135–146)
TOTAL PROTEIN: 6.6 g/dL (ref 6.1–8.1)
Total Bilirubin: 0.4 mg/dL (ref 0.2–1.2)

## 2017-02-12 LAB — URINALYSIS
Bilirubin Urine: NEGATIVE
Glucose, UA: NEGATIVE
Hgb urine dipstick: NEGATIVE
Ketones, ur: NEGATIVE
LEUKOCYTES UA: NEGATIVE
NITRITE: NEGATIVE
PROTEIN: NEGATIVE
SPECIFIC GRAVITY, URINE: 1.009 (ref 1.001–1.035)
pH: 5 (ref 5.0–8.0)

## 2017-02-12 LAB — TSH: TSH: 2.46 m[IU]/L

## 2017-02-13 LAB — URINE CULTURE: Organism ID, Bacteria: NO GROWTH

## 2017-02-15 ENCOUNTER — Telehealth: Payer: Self-pay | Admitting: *Deleted

## 2017-02-15 NOTE — Telephone Encounter (Signed)
-----   Message from Man Otho Darner, NP sent at 02/12/2017  5:17 PM EDT ----- Would you let Mrs Starry to know her TSH and CMP unremarkable, there is no significant change from previous test. Thank you

## 2017-02-15 NOTE — Telephone Encounter (Signed)
Call patient to inform her of lab results, she stated that she understood and had no questions at this time.

## 2017-02-16 LAB — VITAMIN B1: Vitamin B1 (Thiamine): 20 nmol/L (ref 8–30)

## 2017-02-20 DIAGNOSIS — R29898 Other symptoms and signs involving the musculoskeletal system: Secondary | ICD-10-CM | POA: Diagnosis not present

## 2017-02-20 DIAGNOSIS — R2681 Unsteadiness on feet: Secondary | ICD-10-CM | POA: Diagnosis not present

## 2017-02-20 DIAGNOSIS — H8110 Benign paroxysmal vertigo, unspecified ear: Secondary | ICD-10-CM | POA: Diagnosis not present

## 2017-02-27 DIAGNOSIS — R2681 Unsteadiness on feet: Secondary | ICD-10-CM | POA: Diagnosis not present

## 2017-02-27 DIAGNOSIS — R29898 Other symptoms and signs involving the musculoskeletal system: Secondary | ICD-10-CM | POA: Diagnosis not present

## 2017-02-27 DIAGNOSIS — H8110 Benign paroxysmal vertigo, unspecified ear: Secondary | ICD-10-CM | POA: Diagnosis not present

## 2017-02-28 DIAGNOSIS — H353113 Nonexudative age-related macular degeneration, right eye, advanced atrophic without subfoveal involvement: Secondary | ICD-10-CM | POA: Diagnosis not present

## 2017-02-28 DIAGNOSIS — H353124 Nonexudative age-related macular degeneration, left eye, advanced atrophic with subfoveal involvement: Secondary | ICD-10-CM | POA: Diagnosis not present

## 2017-02-28 DIAGNOSIS — H353222 Exudative age-related macular degeneration, left eye, with inactive choroidal neovascularization: Secondary | ICD-10-CM | POA: Diagnosis not present

## 2017-02-28 DIAGNOSIS — H353212 Exudative age-related macular degeneration, right eye, with inactive choroidal neovascularization: Secondary | ICD-10-CM | POA: Diagnosis not present

## 2017-03-01 DIAGNOSIS — H8110 Benign paroxysmal vertigo, unspecified ear: Secondary | ICD-10-CM | POA: Diagnosis not present

## 2017-03-01 DIAGNOSIS — R29898 Other symptoms and signs involving the musculoskeletal system: Secondary | ICD-10-CM | POA: Diagnosis not present

## 2017-03-01 DIAGNOSIS — R2681 Unsteadiness on feet: Secondary | ICD-10-CM | POA: Diagnosis not present

## 2017-03-06 DIAGNOSIS — R2681 Unsteadiness on feet: Secondary | ICD-10-CM | POA: Diagnosis not present

## 2017-03-06 DIAGNOSIS — H8110 Benign paroxysmal vertigo, unspecified ear: Secondary | ICD-10-CM | POA: Diagnosis not present

## 2017-03-06 DIAGNOSIS — R29898 Other symptoms and signs involving the musculoskeletal system: Secondary | ICD-10-CM | POA: Diagnosis not present

## 2017-03-08 DIAGNOSIS — R2681 Unsteadiness on feet: Secondary | ICD-10-CM | POA: Diagnosis not present

## 2017-03-08 DIAGNOSIS — R29898 Other symptoms and signs involving the musculoskeletal system: Secondary | ICD-10-CM | POA: Diagnosis not present

## 2017-03-08 DIAGNOSIS — H8110 Benign paroxysmal vertigo, unspecified ear: Secondary | ICD-10-CM | POA: Diagnosis not present

## 2017-03-15 DIAGNOSIS — R29898 Other symptoms and signs involving the musculoskeletal system: Secondary | ICD-10-CM | POA: Diagnosis not present

## 2017-03-15 DIAGNOSIS — R2681 Unsteadiness on feet: Secondary | ICD-10-CM | POA: Diagnosis not present

## 2017-03-15 DIAGNOSIS — H8110 Benign paroxysmal vertigo, unspecified ear: Secondary | ICD-10-CM | POA: Diagnosis not present

## 2017-03-18 DIAGNOSIS — H8110 Benign paroxysmal vertigo, unspecified ear: Secondary | ICD-10-CM | POA: Diagnosis not present

## 2017-03-18 DIAGNOSIS — R2681 Unsteadiness on feet: Secondary | ICD-10-CM | POA: Diagnosis not present

## 2017-03-18 DIAGNOSIS — R29898 Other symptoms and signs involving the musculoskeletal system: Secondary | ICD-10-CM | POA: Diagnosis not present

## 2017-03-20 DIAGNOSIS — Z85828 Personal history of other malignant neoplasm of skin: Secondary | ICD-10-CM | POA: Diagnosis not present

## 2017-03-20 DIAGNOSIS — L814 Other melanin hyperpigmentation: Secondary | ICD-10-CM | POA: Diagnosis not present

## 2017-03-20 DIAGNOSIS — L821 Other seborrheic keratosis: Secondary | ICD-10-CM | POA: Diagnosis not present

## 2017-04-01 ENCOUNTER — Other Ambulatory Visit: Payer: Self-pay | Admitting: *Deleted

## 2017-04-01 MED ORDER — LORAZEPAM 0.5 MG PO TABS: ORAL_TABLET | ORAL | 0 refills | Status: DC

## 2017-04-01 NOTE — Telephone Encounter (Signed)
[facility]

## 2017-04-11 DIAGNOSIS — H353222 Exudative age-related macular degeneration, left eye, with inactive choroidal neovascularization: Secondary | ICD-10-CM | POA: Diagnosis not present

## 2017-04-11 DIAGNOSIS — H353113 Nonexudative age-related macular degeneration, right eye, advanced atrophic without subfoveal involvement: Secondary | ICD-10-CM | POA: Diagnosis not present

## 2017-04-11 DIAGNOSIS — H353212 Exudative age-related macular degeneration, right eye, with inactive choroidal neovascularization: Secondary | ICD-10-CM | POA: Diagnosis not present

## 2017-04-11 DIAGNOSIS — H353124 Nonexudative age-related macular degeneration, left eye, advanced atrophic with subfoveal involvement: Secondary | ICD-10-CM | POA: Diagnosis not present

## 2017-04-30 ENCOUNTER — Encounter: Payer: Self-pay | Admitting: Internal Medicine

## 2017-04-30 ENCOUNTER — Non-Acute Institutional Stay: Payer: Medicare Other | Admitting: Internal Medicine

## 2017-04-30 VITALS — BP 110/62 | HR 62 | Temp 97.9°F | Resp 16 | Ht 66.0 in | Wt 148.6 lb

## 2017-04-30 DIAGNOSIS — M25561 Pain in right knee: Secondary | ICD-10-CM

## 2017-04-30 DIAGNOSIS — M533 Sacrococcygeal disorders, not elsewhere classified: Secondary | ICD-10-CM

## 2017-04-30 MED ORDER — LIDOCAINE 5 % EX PTCH
1.0000 | MEDICATED_PATCH | CUTANEOUS | 0 refills | Status: DC
Start: 2017-04-30 — End: 2017-05-07

## 2017-04-30 MED ORDER — ACETAMINOPHEN 500 MG PO TABS: 1000.0000 mg | ORAL_TABLET | Freq: Two times a day (BID) | ORAL | 0 refills | Status: DC

## 2017-04-30 NOTE — Progress Notes (Signed)
Provider: Blanchie Serve MD   Location:  [facility] Service:  Clinic (12)  PCP: Blanchie Serve, MD Patient Care Team: Blanchie Serve, MD as PCP - General (Internal Medicine) Estill Dooms, MD as PCP - Internal Medicine (Genetics) Lavonna Monarch, MD as Consulting Physician (Dermatology) Clent Jacks, MD as Consulting Physician (Ophthalmology) Elmarie Shiley, MD as Consulting Physician (Nephrology) Zadie Rhine Clent Demark, MD as Consulting Physician (Ophthalmology) [place], Man X, NP as Nurse Practitioner (Nurse Practitioner)  Extended Emergency Contact Information Primary Emergency Contact: Iovino,James Address: [place] [address] [address] of [place] Phone: [phone] Mobile Phone: [phone] Relation: Son Secondary Emergency Contact: Cherylann Ratel [place] of [place] Phone: [phone] Work Phone: [phone] Mobile Phone: [phone] Relation: Daughter   Goals of Care: Advanced Directive information Advanced Directives 02/07/2017  Does Patient Have a Medical Advance Directive? Yes  Type of Paramedic of [place];Living will  Does patient want to make changes to medical advance directive? No - Patient declined  Copy of [place] in Chart? Yes  Would patient like information on creating a medical advance directive? -      Chief Complaint  Patient presents with  . Acute Visit    Patient has been having right knee, hip and buttock pain for a week. Patient hasnt had a fall. Patient is curently taking Tylenol 500 mg QID for pain.   . Medication Refill    Patient doesnt need any refills at this time.    HPI: Patient is a 83 y.o. female seen today for acute visit. She complaints of pain to her right hip x 1 week. The pain starts at low back area in and around her buttock. She also has pain to right knee. She was pain free to these areas prior to  this. She has history of neuropathy to both legs. She denies chronic back pain. She had similar pain 3-4 years and massage and PT was helpful then. She denies any trauma, injury at present. The pain is constant and extension of leg tends to help relieve the pain at times. Tylenol 500 mg qid has been helping her some. Has occasional muscle spasm but this is chronic.  She uses a rolling walker with brake and seat for unsteady gait. No known history of back surgery. She has history of arthritis.   Past Medical History:  Diagnosis Date  . Abnormality of gait   . Acute, but ill-defined, cerebrovascular disease   . Anxiety   . Arthritis   . Chronic kidney disease, stage II (mild)   . Cystocele, midline   . Dermatophytosis of the body   . Disturbance of skin sensation   . Dizziness and giddiness   . Duodenal ulcer, unspecified as acute or chronic, without hemorrhage, perforation, or obstruction   . Edema   . Elevated blood pressure reading without diagnosis of hypertension   . GERD (gastroesophageal reflux disease)   . Hemorrhoids   . Hypertension   . Left bundle branch hemiblock   . Lumbago   . Macular degeneration (senile) of retina, unspecified   . Myalgia and myositis, unspecified   . Orthostatic hypotension   . Other abnormal blood chemistry   . Other malaise and fatigue   . Personal history of fall   . Polyneuropathy in other diseases classified elsewhere ([place]) 02/16/2014  . Postmenopausal atrophic vaginitis   .  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 Rectocele   . Restless legs syndrome (RLS)   . Unspecified constipation   . Unspecified hereditary and idiopathic peripheral neuropathy   . Urgency of urination    Past Surgical History:  Procedure Laterality Date  . ABDOMINAL HYSTERECTOMY  1972  . APPENDECTOMY  1945  . CATARACT EXTRACTION Bilateral   . EXTERNAL EAR SURGERY  2010   left outer ear basel cell removed  . TONSILLECTOMY  1940    reports that she has never  smoked. She has never used smokeless tobacco. She reports that she does not drink alcohol or use drugs. Social History   Social History  . Marital status: Widowed    Spouse name: N/A  . Number of children: 5  . Years of education: college   Occupational History  . Retired    Social History Main Topics  . Smoking status: Never Smoker  . Smokeless tobacco: Never Used  . Alcohol use No  . Drug use: No  . Sexual activity: No   Other Topics Concern  . Not on file   Social History Narrative   Patient is widowed, with 5 children.   Patient is right handed.   Patient has college education.   Patient does not drink caffeine.     Family History  Problem Relation Age of Onset  . Aortic stenosis Mother   . Heart failure Mother   . Thrombosis Father   . Coronary artery disease Father   . Ataxia Sister   . Ataxia Brother   . Heart disease Brother   . Cancer Brother        Prostate  . Stroke Brother     Health Maintenance  Topic Date Due  . DEXA SCAN  10/08/2017 (Originally 05/04/1994)  . PNA vac Low Risk Adult (2 of 2 - PCV13) 10/08/2017 (Originally 10/09/1995)  . TETANUS/TDAP  10/08/2018 (Originally 10/08/2005)  . INFLUENZA VACCINE  05/08/2017    Allergies  Allergen Reactions  . Amlodipine Other (See Comments)    Reaction:  Critically decreased sodium and potassium levels.    . Enalapril Other (See Comments)    Reaction:  Unknown   . Hctz [Hydrochlorothiazide] Other (See Comments)    Reaction:  Unknown   . Penicillins Other (See Comments)    Reaction:  Unknown   . Amoxicillin Swelling and Rash    Outpatient Encounter Prescriptions as of 04/30/2017  Medication Sig  . bismuth subsalicylate (PEPTO BISMOL) 262 MG chewable tablet Chew 524 mg by mouth. Take two tablets as needed for loose stools, abdominal cramping  . calcium carbonate (TUMS - DOSED IN MG ELEMENTAL CALCIUM) 500 MG chewable tablet Chew 1 tablet by mouth. Take one after each meal and at bedtime for  indigestion or heartburn  . gabapentin (NEURONTIN) 100 MG capsule Take 1 capsule (100 mg total) by mouth 2 (two) times daily.  Marland Kitchen labetalol (NORMODYNE) 100 MG tablet Take one tablet by mouth twice daily to control blood pressure  . LORazepam (ATIVAN) 0.5 MG tablet Take one tablet by mouth at bedtime as needed for rest  . Multiple Vitamins-Minerals (ICAPS) CAPS Take 1 capsule by mouth daily.   Marland Kitchen NIFEdipine (PROCARDIA XL/ADALAT-CC) 60 MG 24 hr tablet Take 1 tablet by mouth daily for blood pressure.  Marland Kitchen omeprazole (PRILOSEC OTC) 20 MG tablet Take 20 mg by mouth every other day.   . telmisartan (MICARDIS) 80 MG tablet TAKE 1 TABLET ONCE DAILY   FOR BLOOD PRESSURE  .  acetaminophen (TYLENOL) 500 MG tablet Take 2 tablets (1,000 mg total) by mouth 2 (two) times daily.  Marland Kitchen lidocaine (LIDODERM) 5 % Place 1 patch onto the skin daily. Remove & Discard patch within 12 hours or as directed by MD  . [DISCONTINUED] Bevacizumab (AVASTIN) 100 MG/4ML SOLN Inject into the vein every 5 (five) weeks. Injection into right eye.  . [DISCONTINUED] Ranibizumab (LUCENTIS) 0.3 MG/0.05ML SOLN Inject 1 Dose into the eye every 5 (five) weeks. Pt uses in right eye.   No facility-administered encounter medications on file as of 04/30/2017.     Review of Systems  Constitutional: Negative for chills, diaphoresis and fever.  HENT: Negative for congestion.   Eyes: Positive for visual disturbance.  Respiratory: Negative for shortness of breath.   Cardiovascular: Negative for chest pain, palpitations and leg swelling.  Gastrointestinal: Negative for abdominal pain, constipation, diarrhea, nausea and vomiting.  Genitourinary: Negative for dysuria and hematuria.  Musculoskeletal: Positive for arthralgias and gait problem. Negative for back pain and neck pain.  Skin: Negative for rash.  Neurological: Positive for tremors. Negative for dizziness, weakness, numbness and headaches.  Hematological: Does not bruise/bleed easily.    Psychiatric/Behavioral: Positive for sleep disturbance.    Vitals:   04/30/17 1130  BP: 110/62  Pulse: 62  Resp: 16  Temp: 97.9 F (36.6 C)  TempSrc: Oral  SpO2: 97%  Weight: 148 lb 9.6 oz (67.4 kg)  Height: 5\' 6"  (1.676 m)   Body mass index is 23.98 kg/m. Physical Exam  Constitutional: She is oriented to person, place, and time. She appears well-developed and well-nourished. No distress.  HENT:  Head: Normocephalic and atraumatic.  Mouth/Throat: Oropharynx is clear and moist.  Eyes: Pupils are equal, round, and reactive to light. Conjunctivae are normal.  Neck: Normal range of motion. Neck supple.  Cardiovascular: Normal rate and regular rhythm.   Pulmonary/Chest: Effort normal and breath sounds normal.  Abdominal: Soft. Bowel sounds are normal.  Musculoskeletal: She exhibits no edema.  Right paraspinal tenderness to sacral area, no spinal tenderness, good ROM with hip and knee joint, pain illicited to right sacral area with external rotation at hip joint, crepitus to right knee, no joint swelling noted  Lymphadenopathy:    She has no cervical adenopathy.  Neurological: She is alert and oriented to person, place, and time.  Skin: Skin is warm and dry. She is not diaphoretic.  Psychiatric: She has a normal mood and affect.    Labs reviewed: Basic Metabolic Panel:  Recent Labs  05/29/16 08/28/16 1511 02/11/17 1030  NA 141 133* 140  K 4.3 4.1 4.3  CL  --  99 106  CO2  --  24 24  GLUCOSE  --  73 106*  BUN 26* 13 26*  CREATININE 1.3* 1.35* 1.29*  CALCIUM  --  9.3 9.2   Liver Function Tests:  Recent Labs  05/29/16 08/28/16 1511 02/11/17 1030  AST 17 25 18   ALT 16 19 14   ALKPHOS 57 69 55  BILITOT  --  0.5 0.4  PROT  --  7.1 6.6  ALBUMIN  --  4.2 3.9   No results for input(s): LIPASE, AMYLASE in the last 8760 hours. No results for input(s): AMMONIA in the last 8760 hours. CBC:  Recent Labs  05/29/16 08/28/16 1558  WBC 5.8 7.2  NEUTROABS 2,552  --    HGB 12.4 13.6  HCT 36 38.4  MCV  --  87.1  PLT 212  --    Cardiac Enzymes: No  results for input(s): CKTOTAL, CKMB, CKMBINDEX, TROPONINI in the last 8760 hours. BNP: Invalid input(s): POCBNP No results found for: HGBA1C Lab Results  Component Value Date   TSH 2.46 02/11/2017   No results found for: VITAMINB12 No results found for: FOLATE No results found for: IRON, TIBC, FERRITIN  Lipid Panel: No results for input(s): CHOL, HDL, LDLCALC, TRIG, CHOLHDL, LDLDIRECT in the last 8760 hours. No results found for: HGBA1C  Procedures since last visit: No results found.  Assessment/Plan  1. Sacral back pain Tenderness to right sacral area on exam. Concern for musculoskeletal pain. Start tylenol 1000 mg bid and lidocaine patch 5% to affected area. PT consult. Reassess in a week if pain worsens or no improvement.   2. Acute pain of right knee No swelling or erythema noted. Normal ROM and crepitus on exam. Likely from OA. Start tylenol extra strength 1000 mg bid x 1 week and reassess if no improvement  Blanchie Serve, MD Internal Medicine [facility] [address] [place], [place] [address] Cell Phone (Monday-Friday 8 am - 5 pm): [phone] On Call: [phone] and follow prompts after 5 pm and on weekends Office Phone: [phone] Office Fax: [phone]

## 2017-05-01 DIAGNOSIS — M461 Sacroiliitis, not elsewhere classified: Secondary | ICD-10-CM | POA: Diagnosis not present

## 2017-05-01 DIAGNOSIS — R29898 Other symptoms and signs involving the musculoskeletal system: Secondary | ICD-10-CM | POA: Diagnosis not present

## 2017-05-01 DIAGNOSIS — M545 Low back pain: Secondary | ICD-10-CM | POA: Diagnosis not present

## 2017-05-02 DIAGNOSIS — M545 Low back pain: Secondary | ICD-10-CM | POA: Diagnosis not present

## 2017-05-02 DIAGNOSIS — R29898 Other symptoms and signs involving the musculoskeletal system: Secondary | ICD-10-CM | POA: Diagnosis not present

## 2017-05-02 DIAGNOSIS — M461 Sacroiliitis, not elsewhere classified: Secondary | ICD-10-CM | POA: Diagnosis not present

## 2017-05-06 ENCOUNTER — Telehealth: Payer: Self-pay | Admitting: *Deleted

## 2017-05-06 DIAGNOSIS — M545 Low back pain: Secondary | ICD-10-CM | POA: Diagnosis not present

## 2017-05-06 DIAGNOSIS — R29898 Other symptoms and signs involving the musculoskeletal system: Secondary | ICD-10-CM | POA: Diagnosis not present

## 2017-05-06 DIAGNOSIS — M461 Sacroiliitis, not elsewhere classified: Secondary | ICD-10-CM | POA: Diagnosis not present

## 2017-05-06 NOTE — Telephone Encounter (Signed)
Received fax from Cover[employer]eds regarding Prior Authorization for Lidocaine Patch. Initiated through website.  [id number] Submitted, 24-48 hour determination.

## 2017-05-06 NOTE — Telephone Encounter (Signed)
Received fax from [facility] [phone] and Lidocaine Patch was DENIED. Stated that Lidoderm criteria covers this drug when you have one of the following: -Pain associated with post herpetic neuralgia -pain associated with diabetic neuropathy -Pain associated with cancer-related neuropathy (including treatment related neuropathy)  Faxed [facility] fax to [facility] and placed in Dr. Jackolyn Confer box for review.

## 2017-05-07 ENCOUNTER — Telehealth: Payer: Self-pay

## 2017-05-07 ENCOUNTER — Encounter: Payer: BC Managed Care – PPO | Admitting: Internal Medicine

## 2017-05-07 ENCOUNTER — Ambulatory Visit: Payer: Self-pay | Admitting: Adult Health

## 2017-05-07 MED ORDER — LIDOCAINE 4 % EX PTCH: 1.0000 "application " | MEDICATED_PATCH | CUTANEOUS | Status: DC

## 2017-05-07 NOTE — Telephone Encounter (Signed)
Spoke with Jacqueline Dodson to let her know that she can get the 4% Lidocaine patch over the counter at [facility]. She was very thankful that we followed up with her in a timely manner. She stated that she would go pick some up today.

## 2017-05-07 NOTE — Telephone Encounter (Signed)
Noted. Discontinue current lidocaine patch order and to provide lidocaine patch 4% OTC patch to provide relief with her pain to sacral area- temporary attempt before reassessing. Before sending this script, verify with pharmacy.

## 2017-05-08 DIAGNOSIS — R29898 Other symptoms and signs involving the musculoskeletal system: Secondary | ICD-10-CM | POA: Diagnosis not present

## 2017-05-08 DIAGNOSIS — R29818 Other symptoms and signs involving the nervous system: Secondary | ICD-10-CM | POA: Diagnosis not present

## 2017-05-08 DIAGNOSIS — M545 Low back pain: Secondary | ICD-10-CM | POA: Diagnosis not present

## 2017-05-14 ENCOUNTER — Ambulatory Visit: Payer: Self-pay | Admitting: Adult Health

## 2017-05-20 ENCOUNTER — Ambulatory Visit (INDEPENDENT_AMBULATORY_CARE_PROVIDER_SITE_OTHER): Payer: Medicare Other | Admitting: Adult Health

## 2017-05-20 ENCOUNTER — Encounter: Payer: Self-pay | Admitting: Adult Health

## 2017-05-20 VITALS — BP 115/57 | HR 60 | Ht 66.0 in | Wt 147.6 lb

## 2017-05-20 DIAGNOSIS — G2581 Restless legs syndrome: Secondary | ICD-10-CM | POA: Diagnosis not present

## 2017-05-20 DIAGNOSIS — G609 Hereditary and idiopathic neuropathy, unspecified: Secondary | ICD-10-CM

## 2017-05-20 MED ORDER — PRAMIPEXOLE DIHYDROCHLORIDE 0.125 MG PO TABS: 0.1250 mg | ORAL_TABLET | Freq: Every day | ORAL | 5 refills | Status: DC

## 2017-05-20 NOTE — Progress Notes (Signed)
PATIENT: Jacqueline Dodson DOB: Feb 14, 1927  REASON FOR VISIT: follow up- peripheral neuropathy HISTORY FROM: patient  HISTORY OF PRESENT ILLNESS: Today 05/20/17 Jacqueline Dodson is an 83 year old female with a history of peripheral neuropathy. She returns today for follow-up. She continues on gabapentin 100 mg in the morning and 100 mg in the evenings. She states in the past she tried taking 300 mg daily but it caused ankle swelling. She states that she has a lot of restlessness in the evenings as well as tingling and burning in the legs. She states that the restlessness bothers her the most. She denies any changes in her gait or balance. Denies any falls. She is currently using a Rollator. She returns today for an evaluation.  HISTORY 05/08/16: Jacqueline Dodson is an 83 year old right-handed white female with a history of a peripheral neuropathy. The patient has had some gait instability associated with this. The patient walks with a walker, she denies any falls since last seen. The patient is on gabapentin for the paresthesias associated with the peripheral neuropathy, she takes 100 mg twice daily. In the past, she has not been able tolerate higher doses of gabapentin secondary to ankle swelling. She did not tolerate Cymbalta. She has some issues with restless leg syndrome as well. The patient has not wanted to alter her medication dosing in the past. She returns to this office for an evaluation.   REVIEW OF SYSTEMS: Out of a complete 14 system review of symptoms, the patient complains only of the following symptoms, and all other reviewed systems are negative.  Incontinence of bladder, joint pain, incontinence of bowels, restless leg, insomnia, frequent waking  ALLERGIES: Allergies  Allergen Reactions  . Amlodipine Other (See Comments)    Reaction:  Critically decreased sodium and potassium levels.    . Enalapril Other (See Comments)    Reaction:  Unknown   . Hctz [Hydrochlorothiazide] Other (See Comments)    Reaction:  Unknown   . Penicillins Other (See Comments)    Reaction:  Unknown   . Amoxicillin Swelling and Rash    HOME MEDICATIONS: Outpatient Medications Prior to Visit  Medication Sig Dispense Refill  . acetaminophen (TYLENOL) 500 MG tablet Take 2 tablets (1,000 mg total) by mouth 2 (two) times daily. 30 tablet 0  . bismuth subsalicylate (PEPTO BISMOL) 262 MG chewable tablet Chew 524 mg by mouth. Take two tablets as needed for loose stools, abdominal cramping    . calcium carbonate (TUMS - DOSED IN MG ELEMENTAL CALCIUM) 500 MG chewable tablet Chew 1 tablet by mouth. Take one after each meal and at bedtime for indigestion or heartburn    . gabapentin (NEURONTIN) 100 MG capsule Take 1 capsule (100 mg total) by mouth 2 (two) times daily. 180 capsule 3  . labetalol (NORMODYNE) 100 MG tablet Take one tablet by mouth twice daily to control blood pressure 180 tablet 3  . LORazepam (ATIVAN) 0.5 MG tablet Take one tablet by mouth at bedtime as needed for rest 90 tablet 0  . Multiple Vitamins-Minerals (ICAPS) CAPS Take 1 capsule by mouth daily.     Marland Kitchen NIFEdipine (PROCARDIA XL/ADALAT-CC) 60 MG 24 hr tablet Take 1 tablet by mouth daily for blood pressure. 90 tablet 1  . omeprazole (PRILOSEC OTC) 20 MG tablet Take 20 mg by mouth every other day.     . telmisartan (MICARDIS) 80 MG tablet TAKE 1 TABLET ONCE DAILY   FOR BLOOD PRESSURE 90 tablet 3  . Lidocaine 4 % PTCH Apply 1  application topically daily.     No facility-administered medications prior to visit.     PAST MEDICAL HISTORY: Past Medical History:  Diagnosis Date  . Abnormality of gait   . Acute, but ill-defined, cerebrovascular disease   . Anxiety   . Arthritis   . Chronic kidney disease, stage II (mild)   . Cystocele, midline   . Dermatophytosis of the body   . Disturbance of skin sensation   . Dizziness and giddiness   . Duodenal ulcer, unspecified as acute or chronic, without hemorrhage, perforation, or obstruction   . Edema     . Elevated blood pressure reading without diagnosis of hypertension   . GERD (gastroesophageal reflux disease)   . Hemorrhoids   . Hypertension   . Left bundle branch hemiblock   . Lumbago   . Macular degeneration (senile) of retina, unspecified   . Myalgia and myositis, unspecified   . Orthostatic hypotension   . Other abnormal blood chemistry   . Other malaise and fatigue   . Personal history of fall   . Polyneuropathy in other diseases classified elsewhere ([place]) 02/16/2014  . Postmenopausal atrophic vaginitis   . 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 Rectocele   . Restless legs syndrome (RLS)   . Unspecified constipation   . Unspecified hereditary and idiopathic peripheral neuropathy   . Urgency of urination     PAST SURGICAL HISTORY: Past Surgical History:  Procedure Laterality Date  . ABDOMINAL HYSTERECTOMY  1972  . APPENDECTOMY  1945  . CATARACT EXTRACTION Bilateral   . EXTERNAL EAR SURGERY  2010   left outer ear basel cell removed  . TONSILLECTOMY  1940    FAMILY HISTORY: Family History  Problem Relation Age of Onset  . Aortic stenosis Mother   . Heart failure Mother   . Thrombosis Father   . Coronary artery disease Father   . Ataxia Sister   . Ataxia Brother   . Heart disease Brother   . Cancer Brother        Prostate  . Stroke Brother     SOCIAL HISTORY: Social History   Social History  . Marital status: Widowed    Spouse name: N/A  . Number of children: 5  . Years of education: college   Occupational History  . Retired    Social History Main Topics  . Smoking status: Never Smoker  . Smokeless tobacco: Never Used  . Alcohol use No  . Drug use: No  . Sexual activity: No   Other Topics Concern  . Not on file   Social History Narrative   Patient is widowed, with 5 children.   Patient is right handed.   Patient has college education.   Patient does not drink caffeine.      PHYSICAL EXAM  Vitals:   05/20/17 0847  BP: (!)  115/57  Pulse: 60  Weight: 147 lb 9.6 oz (67 kg)  Height: 5\' 6"  (1.676 m)   Body mass index is 23.82 kg/m.  Generalized: Well developed, in no acute distress   Neurological examination  Mentation: Alert oriented to time, place, history taking. Follows all commands speech and language fluent Cranial nerve II-XII: Pupils were equal round reactive to light. Extraocular movements were full, visual field were full on confrontational test. Facial sensation and strength were normal. Uvula tongue midline. Head turning and shoulder shrug  were normal and symmetric. Motor: The motor testing reveals 5 over 5 strength of all 4 extremities. Good symmetric motor  tone is noted throughout.  Sensory: Sensory testing is intact to soft touch on all 4 extremities. No evidence of extinction is noted.  Coordination: Cerebellar testing reveals good finger-nose-finger and heel-to-shin bilaterally.  Gait and station: Uses a Rollator when ambulating. Reflexes: Deep tendon reflexes are symmetric and normal bilaterally.   DIAGNOSTIC DATA (LABS, IMAGING, TESTING) - I reviewed patient records, labs, notes, testing and imaging myself where available.  Lab Results  Component Value Date   WBC 7.2 08/28/2016   HGB 13.6 08/28/2016   HCT 38.4 08/28/2016   MCV 87.1 08/28/2016   PLT 212 05/29/2016      Component Value Date/Time   NA 140 02/11/2017 1030   NA 141 05/29/2016   K 4.3 02/11/2017 1030   CL 106 02/11/2017 1030   CO2 24 02/11/2017 1030   GLUCOSE 106 (H) 02/11/2017 1030   BUN 26 (H) 02/11/2017 1030   BUN 26 (A) 05/29/2016   CREATININE 1.29 (H) 02/11/2017 1030   CALCIUM 9.2 02/11/2017 1030   PROT 6.6 02/11/2017 1030   ALBUMIN 3.9 02/11/2017 1030   AST 18 02/11/2017 1030   ALT 14 02/11/2017 1030   ALKPHOS 55 02/11/2017 1030   BILITOT 0.4 02/11/2017 1030   GFRNONAA 35 (L) 08/28/2016 1511   GFRAA 41 (L) 08/28/2016 1511    Lab Results  Component Value Date   TSH 2.46 02/11/2017       ASSESSMENT AND PLAN 83 y.o. year old female  has a past medical history of Abnormality of gait; Acute, but ill-defined, cerebrovascular disease; Anxiety; Arthritis; Chronic kidney disease, stage II (mild); Cystocele, midline; Dermatophytosis of the body; Disturbance of skin sensation; Dizziness and giddiness; Duodenal ulcer, unspecified as acute or chronic, without hemorrhage, perforation, or obstruction; Edema; Elevated blood pressure reading without diagnosis of hypertension; GERD (gastroesophageal reflux disease); Hemorrhoids; Hypertension; Left bundle branch hemiblock; Lumbago; Macular degeneration (senile) of retina, unspecified; Myalgia and myositis, unspecified; Orthostatic hypotension; Other abnormal blood chemistry; Other malaise and fatigue; Personal history of fall; Polyneuropathy in other diseases classified elsewhere ([place]) (02/16/2014); Postmenopausal atrophic vaginitis; 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 here with:  1. Peripheral neuropathy 2. restless leg syndrome  The patient is having more restlessness at night.. She will continue on gabapentin 100 mg twice a day. . We will try low-dose Mirapex for restless legs taking 0.125 mg 1 hour before bedtime. I have reviewed side effects of this medication with the patient and given her a handout. She will follow-up in one year or sooner if needed.   Ward Givens, MSN, NP-C 05/20/2017, 9:04 AM [facility] [address], [place],  [address] [phone]

## 2017-05-20 NOTE — Patient Instructions (Signed)
Start Mirapex 0.125 mg at bedtime (1 hour before bedtime) Continue Gabapentin If your symptoms worsen or you develop new symptoms please let us know.   Pramipexole tablets What is this medicine? PRAMIPEXOLE (pra mi PEX ole) is used to treat symptoms of Parkinson's disease. It is also used to treat Restless Legs Syndrome. This medicine may be used for other purposes; ask your health care provider or pharmacist if you have questions. COMMON BRAND NAME(S): Mirapex What should I tell my health care provider before I take this medicine? They need to know if you have any of these conditions: -dizzy or fainting spells -heart disease -kidney disease -low blood pressure -schizophrenia -sleeping problems -an unusual or allergic reaction to pramipexole, other medicines, foods, dyes, or preservatives -pregnant or trying to get pregnant -breast-feeding How should I use this medicine? Take this medicine by mouth with a glass of water. Follow the directions on the prescription label. Take with food. Take your doses at regular intervals. Do not take your medicine more often than directed. Do not stop taking except on the advice of your doctor or health care professional. Talk to your pediatrician regarding the use of this medicine in children. Special care may be needed. Overdosage: If you think you have taken too much of this medicine contact a poison control center or emergency room at once. NOTE: This medicine is only for you. Do not share this medicine with others. What if I miss a dose? If you miss a dose, take it as soon as you can. If it is almost time for your next dose, take only that dose. Do not take double or extra doses. What may interact with this medicine? -amantadine -cimetidine -diltiazem -medicines for mental problems or psychotic disturbances -medicines for sleep -metoclopramide -quinidine or quinine -ranitidine -triamterene -verapamil This list may not describe all possible  interactions. Give your health care provider a list of all the medicines, herbs, non-prescription drugs, or dietary supplements you use. Also tell them if you smoke, drink alcohol, or use illegal drugs. Some items may interact with your medicine. What should I watch for while using this medicine? Visit your doctor or health care professional for regular checks on your progress. It may be several weeks or months before you feel the full effect of this medicine. Continue to take your medicine on a regular schedule. You may get drowsy or dizzy. Do not drive, use machinery, or do anything that needs mental alertness until you know how this drug affects you. Do not stand or sit up quickly, especially if you are an older patient. This reduces the risk of dizzy or fainting spells. If you find that you have sudden feelings of wanting to sleep during normal activities, like cooking, watching television, or while driving or riding in a car, you should contact your health care professional. Your mouth may get dry. Chewing sugarless gum or sucking hard candy, and drinking plenty of water may help. Contact your doctor if the problem does not go away or is severe. There have been reports of increased sexual urges or other strong urges such as gambling while taking some medicines for Parkinson's disease. If you experience any of these urges while taking this medicine, you should report it to your health care provider as soon as possible. You should check your skin often for changes to moles and new growths while taking this medicine. Call your doctor if you notice any of these changes. What side effects may I notice from receiving this  medicine? Side effects that you should report to your doctor or health care professional as soon as possible: -confusion -double vision or other vision problems -fainting spells -falling asleep during normal activities like driving -hallucination, loss of contact with reality -mental  changes -muscle pain or severe muscle weakness -uncontrollable movements of the arms, face, hands, head, mouth, shoulders, or upper body Side effects that usually do not require medical attention (report to your doctor or health care professional if they continue or are bothersome): -constipation -frequent urination -mild weakness -nausea This list may not describe all possible side effects. Call your doctor for medical advice about side effects. You may report side effects to FDA at 1-800-FDA-1088. Where should I keep my medicine? Keep out of the reach of children. Store at room temperature between 15 and 30 degrees C (59 and 86 degrees F). Protect from light. Throw away any unused medicine after the expiration date. NOTE: This sheet is a summary. It may not cover all possible information. If you have questions about this medicine, talk to your doctor, pharmacist, or health care provider.  2018 Elsevier/Gold Standard (2014-02-08 15:53:08)

## 2017-05-20 NOTE — Progress Notes (Signed)
I have read the note, and I agree with the clinical assessment and plan.  WILLIS,CHARLES KEITH   

## 2017-05-22 DIAGNOSIS — R29898 Other symptoms and signs involving the musculoskeletal system: Secondary | ICD-10-CM | POA: Diagnosis not present

## 2017-05-22 DIAGNOSIS — R29818 Other symptoms and signs involving the nervous system: Secondary | ICD-10-CM | POA: Diagnosis not present

## 2017-05-22 DIAGNOSIS — M545 Low back pain: Secondary | ICD-10-CM | POA: Diagnosis not present

## 2017-06-14 ENCOUNTER — Other Ambulatory Visit: Payer: Self-pay | Admitting: Neurology

## 2017-07-09 ENCOUNTER — Other Ambulatory Visit: Payer: Self-pay | Admitting: *Deleted

## 2017-07-09 MED ORDER — LORAZEPAM 0.5 MG PO TABS: ORAL_TABLET | ORAL | 0 refills | Status: DC

## 2017-07-09 NOTE — Telephone Encounter (Signed)
Patient requested refill to [facility].

## 2017-07-17 DIAGNOSIS — Z23 Encounter for immunization: Secondary | ICD-10-CM | POA: Diagnosis not present

## 2017-07-25 DIAGNOSIS — H353222 Exudative age-related macular degeneration, left eye, with inactive choroidal neovascularization: Secondary | ICD-10-CM | POA: Diagnosis not present

## 2017-07-25 DIAGNOSIS — H353113 Nonexudative age-related macular degeneration, right eye, advanced atrophic without subfoveal involvement: Secondary | ICD-10-CM | POA: Diagnosis not present

## 2017-07-25 DIAGNOSIS — H353124 Nonexudative age-related macular degeneration, left eye, advanced atrophic with subfoveal involvement: Secondary | ICD-10-CM | POA: Diagnosis not present

## 2017-07-25 DIAGNOSIS — H353212 Exudative age-related macular degeneration, right eye, with inactive choroidal neovascularization: Secondary | ICD-10-CM | POA: Diagnosis not present

## 2017-08-09 DIAGNOSIS — H35371 Puckering of macula, right eye: Secondary | ICD-10-CM | POA: Diagnosis not present

## 2017-08-09 DIAGNOSIS — H4321 Crystalline deposits in vitreous body, right eye: Secondary | ICD-10-CM | POA: Diagnosis not present

## 2017-08-09 DIAGNOSIS — H353132 Nonexudative age-related macular degeneration, bilateral, intermediate dry stage: Secondary | ICD-10-CM | POA: Diagnosis not present

## 2017-08-09 DIAGNOSIS — Z961 Presence of intraocular lens: Secondary | ICD-10-CM | POA: Diagnosis not present

## 2017-08-09 DIAGNOSIS — H40013 Open angle with borderline findings, low risk, bilateral: Secondary | ICD-10-CM | POA: Diagnosis not present

## 2017-08-10 ENCOUNTER — Other Ambulatory Visit: Payer: Self-pay | Admitting: Internal Medicine

## 2017-08-22 ENCOUNTER — Non-Acute Institutional Stay: Payer: Medicare Other | Admitting: Nurse Practitioner

## 2017-08-22 ENCOUNTER — Encounter: Payer: Self-pay | Admitting: Nurse Practitioner

## 2017-08-22 DIAGNOSIS — I10 Essential (primary) hypertension: Secondary | ICD-10-CM | POA: Diagnosis not present

## 2017-08-22 DIAGNOSIS — G4700 Insomnia, unspecified: Secondary | ICD-10-CM | POA: Diagnosis not present

## 2017-08-22 DIAGNOSIS — G2581 Restless legs syndrome: Secondary | ICD-10-CM | POA: Diagnosis not present

## 2017-08-22 NOTE — Assessment & Plan Note (Signed)
peripheral neuropathy, prickling sensation in legs,  restless legs. She saw her Neurology, he prescribed Mirapex, she has not started on it since she is concerned with its possible side effects. She is taking Gabapentin 100mg  bid(caused swelling when it was at higher dose). Also she takes Tylenol prn. Right hip/buttock pain is better with physical therapy.

## 2017-08-22 NOTE — Patient Instructions (Signed)
CBC CMP lipid panel prior to the next appointment.   Next appt: 6 months  Time spend 25 minutes. Tdap/Prevnar 13 prescription provided to the patient during today's visit.

## 2017-08-22 NOTE — Assessment & Plan Note (Signed)
takes Lorazepam 0.5mg  at night, sometimes she sleeps better than the other times, she attributed it to her peripheral neuropathy, prickling sensation in legs,  restless legs. She saw her Neurology, he prescribed Mirapex, she has not started on it since she is concerned with its possible side effects. She is taking Gabapentin 100mg  bid(caused swelling when it was at higher dose).

## 2017-08-22 NOTE — Assessment & Plan Note (Addendum)
Blood pressure is controlled, continue Micardis 80mg  qd, Nifedipine 60mg , Labetalol 100mg  qd. Update CBC, CMP, Lipid panel.

## 2017-08-22 NOTE — Progress Notes (Signed)
Location:      Place of Service:  Clinic (12) Provider: Marlana Latus NP  Code Status: DNR Goals of Care:  Advanced Directives 08/22/2017  Does Patient Have a Medical Advance Directive? Yes  Type of Paramedic of [place];Living will  Does patient want to make changes to medical advance directive? No - Patient declined  Copy of [place] in Chart? Yes  Would patient like information on creating a medical advance directive? -     Chief Complaint  Patient presents with  . Medical Management of Chronic Issues    6 mo f/u, sleeping and bowel issues,    HPI: Patient is a 83 y.o. female seen today for medical management of chronic diseases.    History of insomnia, takes Lorazepam 0.5mg  at night, sometimes she sleeps better than the other times, she attributed it to her peripheral neuropathy, prickling sensation in legs,  restless legs. She saw her Neurology, he prescribed Mirapex, she has not started on it since she is concerned with its possible side effects. She is taking Gabapentin 100mg  bid(caused swelling when it was at higher dose). Also she takes Tylenol prn. Right hip/buttock pain is better with physical therapy. HTN controlled on Micardis 80mg  qd, Nifedipine 60mg , Labetalol 100mg  qd.    Past Medical History:  Diagnosis Date  . Abnormality of gait   . Acute, but ill-defined, cerebrovascular disease   . Anxiety   . Arthritis   . Chronic kidney disease, stage II (mild)   . Cystocele, midline   . Dermatophytosis of the body   . Disturbance of skin sensation   . Dizziness and giddiness   . Duodenal ulcer, unspecified as acute or chronic, without hemorrhage, perforation, or obstruction   . Edema   . Elevated blood pressure reading without diagnosis of hypertension   . GERD (gastroesophageal reflux disease)   . Hemorrhoids   . Hypertension   . Left bundle branch hemiblock   . Lumbago   . Macular degeneration (senile) of retina,  unspecified   . Myalgia and myositis, unspecified   . Orthostatic hypotension   . Other abnormal blood chemistry   . Other malaise and fatigue   . Personal history of fall   . Polyneuropathy in other diseases classified elsewhere ([place]) 02/16/2014  . Postmenopausal atrophic vaginitis   . 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 Rectocele   . Restless legs syndrome (RLS)   . Unspecified constipation   . Unspecified hereditary and idiopathic peripheral neuropathy   . Urgency of urination     Past Surgical History:  Procedure Laterality Date  . ABDOMINAL HYSTERECTOMY  1972  . APPENDECTOMY  1945  . CATARACT EXTRACTION Bilateral   . EXTERNAL EAR SURGERY  2010   left outer ear basel cell removed  . TONSILLECTOMY  1940    Allergies  Allergen Reactions  . Amlodipine Other (See Comments)    Reaction:  Critically decreased sodium and potassium levels.    . Enalapril Other (See Comments)    Reaction:  Unknown   . Hctz [Hydrochlorothiazide] Other (See Comments)    Reaction:  Unknown   . Penicillins Other (See Comments)    Reaction:  Unknown   . Amoxicillin Swelling and Rash    Allergies as of 08/22/2017      Reactions   Amlodipine Other (See Comments)   Reaction:  Critically decreased sodium and potassium levels.     Enalapril Other (See Comments)   Reaction:  Unknown  Hctz [hydrochlorothiazide] Other (See Comments)   Reaction:  Unknown    Penicillins Other (See Comments)   Reaction:  Unknown    Amoxicillin Swelling, Rash      Medication List        Accurate as of 08/22/17  4:50 PM. Always use your most recent med list.          acetaminophen 500 MG tablet Commonly known as:  TYLENOL Take 2 tablets (1,000 mg total) by mouth 2 (two) times daily.   bismuth subsalicylate 540 MG chewable tablet Commonly known as:  PEPTO BISMOL Chew 524 mg by mouth. Take two tablets as needed for loose stools, abdominal cramping   calcium carbonate 500 MG chewable tablet Commonly  known as:  TUMS - dosed in mg elemental calcium Chew 1 tablet by mouth. Take one after each meal and at bedtime for indigestion or heartburn   gabapentin 100 MG capsule Commonly known as:  NEURONTIN TAKE 1 CAPSULE TWICE DAILY   ICAPS Caps Take 1 capsule by mouth daily.   labetalol 100 MG tablet Commonly known as:  NORMODYNE Take one tablet by mouth twice daily to control blood pressure   LORazepam 0.5 MG tablet Commonly known as:  ATIVAN Take one tablet by mouth at bedtime as needed for rest   NIFEdipine 60 MG 24 hr tablet Commonly known as:  PROCARDIA XL/ADALAT-CC TAKE 1 TABLET DAILY FOR    BLOOD PRESSURE   omeprazole 20 MG tablet Commonly known as:  PRILOSEC OTC Take 20 mg by mouth every other day.   telmisartan 80 MG tablet Commonly known as:  MICARDIS TAKE 1 TABLET ONCE DAILY   FOR BLOOD PRESSURE       Review of Systems:  Review of Systems  Constitutional: Negative for activity change, appetite change, chills, diaphoresis, fatigue and fever.  HENT: Positive for hearing loss. Negative for congestion, trouble swallowing and voice change.   Eyes: Negative for visual disturbance.  Respiratory: Negative for cough, choking, chest tightness, shortness of breath and wheezing.   Cardiovascular: Negative for chest pain, palpitations and leg swelling.  Gastrointestinal: Negative for abdominal distention, abdominal pain, constipation, diarrhea, nausea and vomiting.       Incontinent of bowel.   Endocrine: Negative for cold intolerance.  Genitourinary: Negative for difficulty urinating and dysuria.       Incontinent of urine  Musculoskeletal: Negative for arthralgias, back pain and gait problem.  Skin: Negative for color change, pallor and rash.  Neurological: Negative for tremors, speech difficulty, weakness, numbness and headaches.       Prickling in BLE  Psychiatric/Behavioral: Positive for sleep disturbance. Negative for agitation, behavioral problems, confusion and  hallucinations. The patient is not nervous/anxious.     Health Maintenance  Topic Date Due  . DEXA SCAN  10/08/2017 (Originally 05/04/1994)  . PNA vac Low Risk Adult (2 of 2 - PCV13) 10/08/2017 (Originally 10/09/1995)  . TETANUS/TDAP  10/08/2018 (Originally 10/08/2005)  . INFLUENZA VACCINE  Completed    Physical Exam: Vitals:   08/22/17 1405  BP: 112/60  Pulse: 73  Resp: 20  Temp: 98.4 F (36.9 C)  TempSrc: Oral  SpO2: 96%  Weight: 152 lb 9.6 oz (69.2 kg)  Height: 5\' 6"  (1.676 m)   Body mass index is 24.63 kg/m. Physical Exam  Constitutional: She is oriented to person, place, and time. She appears well-developed and well-nourished.  HENT:  Head: Normocephalic and atraumatic.  Eyes: Conjunctivae and EOM are normal. Pupils are equal, round, and reactive to  light.  Neck: Neck supple. No JVD present. No thyromegaly present.  Cardiovascular: Normal rate and regular rhythm.  Murmur heard. Pulmonary/Chest: Effort normal and breath sounds normal. She has no wheezes. She has no rales.  Abdominal: Soft. Bowel sounds are normal. She exhibits no distension. There is no tenderness. There is no rebound.  Genitourinary: No vaginal discharge found.  Musculoskeletal: Normal range of motion. She exhibits no edema or tenderness.  Neurological: She is alert and oriented to person, place, and time. She exhibits normal muscle tone. Coordination normal.  Skin: Skin is warm and dry. No rash noted. No erythema.  Psychiatric: She has a normal mood and affect. Her behavior is normal. Judgment and thought content normal.    Labs reviewed: Basic Metabolic Panel: Recent Labs    08/28/16 1511 02/11/17 1030  NA 133* 140  K 4.1 4.3  CL 99 106  CO2 24 24  GLUCOSE 73 106*  BUN 13 26*  CREATININE 1.35* 1.29*  CALCIUM 9.3 9.2  TSH  --  2.46   Liver Function Tests: Recent Labs    08/28/16 1511 02/11/17 1030  AST 25 18  ALT 19 14  ALKPHOS 69 55  BILITOT 0.5 0.4  PROT 7.1 6.6  ALBUMIN 4.2 3.9    No results for input(s): LIPASE, AMYLASE in the last 8760 hours. No results for input(s): AMMONIA in the last 8760 hours. CBC: Recent Labs    08/28/16 1558  WBC 7.2  HGB 13.6  HCT 38.4  MCV 87.1   Lipid Panel: No results for input(s): CHOL, HDL, LDLCALC, TRIG, CHOLHDL, LDLDIRECT in the last 8760 hours. No results found for: HGBA1C  Procedures since last visit: No results found.  Assessment/Plan  Insomnia  takes Lorazepam 0.5mg  at night, sometimes she sleeps better than the other times, she attributed it to her peripheral neuropathy, prickling sensation in legs,  restless legs. She saw her Neurology, he prescribed Mirapex, she has not started on it since she is concerned with its possible side effects. She is taking Gabapentin 100mg  bid(caused swelling when it was at higher dose).   Restless legs syndrome (RLS) peripheral neuropathy, prickling sensation in legs,  restless legs. She saw her Neurology, he prescribed Mirapex, she has not started on it since she is concerned with its possible side effects. She is taking Gabapentin 100mg  bid(caused swelling when it was at higher dose). Also she takes Tylenol prn. Right hip/buttock pain is better with physical therapy.  HTN (hypertension) Blood pressure is controlled, continue Micardis 80mg  qd, Nifedipine 60mg , Labetalol 100mg  qd. Update CBC, CMP, Lipid panel.      Labs/tests ordered: CBC CMP lipid panel prior to the next appointment.   Next appt: 6 months  Time spend 25 minutes. Tdap/Prevnar 13 prescription provided to the patient during today's visit.

## 2017-08-26 ENCOUNTER — Other Ambulatory Visit: Payer: Self-pay

## 2017-08-26 DIAGNOSIS — I10 Essential (primary) hypertension: Secondary | ICD-10-CM

## 2017-08-26 DIAGNOSIS — G2581 Restless legs syndrome: Secondary | ICD-10-CM

## 2017-08-27 ENCOUNTER — Encounter: Payer: Self-pay | Admitting: Nurse Practitioner

## 2017-08-27 DIAGNOSIS — N183 Chronic kidney disease, stage 3 (moderate): Secondary | ICD-10-CM

## 2017-08-27 DIAGNOSIS — I130 Hypertensive heart and chronic kidney disease with heart failure and stage 1 through stage 4 chronic kidney disease, or unspecified chronic kidney disease: Secondary | ICD-10-CM | POA: Insufficient documentation

## 2017-08-27 LAB — COMPLETE METABOLIC PANEL WITH GFR
AG RATIO: 1.5 (calc) (ref 1.0–2.5)
ALT: 18 U/L (ref 6–29)
AST: 20 U/L (ref 10–35)
Albumin: 4 g/dL (ref 3.6–5.1)
Alkaline phosphatase (APISO): 65 U/L (ref 33–130)
BUN/Creatinine Ratio: 20 (calc) (ref 6–22)
BUN: 28 mg/dL — ABNORMAL HIGH (ref 7–25)
CALCIUM: 9.5 mg/dL (ref 8.6–10.4)
CO2: 25 mmol/L (ref 20–32)
Chloride: 106 mmol/L (ref 98–110)
Creat: 1.41 mg/dL — ABNORMAL HIGH (ref 0.60–0.88)
GFR, EST AFRICAN AMERICAN: 38 mL/min/{1.73_m2} — AB (ref >=60)
GFR, EST NON AFRICAN AMERICAN: 33 mL/min/{1.73_m2} — AB (ref >=60)
GLOBULIN: 2.7 g/dL (ref 1.9–3.7)
Glucose, Bld: 105 mg/dL — ABNORMAL HIGH (ref 65–99)
POTASSIUM: 4.4 mmol/L (ref 3.5–5.3)
SODIUM: 138 mmol/L (ref 135–146)
TOTAL PROTEIN: 6.7 g/dL (ref 6.1–8.1)
Total Bilirubin: 0.4 mg/dL (ref 0.2–1.2)

## 2017-08-27 LAB — LIPID PANEL
CHOL/HDL RATIO: 6.7 (calc) — AB (ref <5.0)
CHOLESTEROL: 241 mg/dL — AB (ref <200)
HDL: 36 mg/dL — AB (ref >50)
LDL Cholesterol (Calc): 168 mg/dL (calc) — ABNORMAL HIGH
Non-HDL Cholesterol (Calc): 205 mg/dL (calc) — ABNORMAL HIGH (ref <130)
TRIGLYCERIDES: 201 mg/dL — AB (ref <150)

## 2017-08-27 LAB — CBC
HEMATOCRIT: 34.7 % — AB (ref 35.0–45.0)
Hemoglobin: 12.1 g/dL (ref 11.7–15.5)
MCH: 30.8 pg (ref 27.0–33.0)
MCHC: 34.9 g/dL (ref 32.0–36.0)
MCV: 88.3 fL (ref 80.0–100.0)
MPV: 9 fL (ref 7.5–12.5)
PLATELETS: 217 10*3/uL (ref 140–400)
RBC: 3.93 10*6/uL (ref 3.80–5.10)
RDW: 12.6 % (ref 11.0–15.0)
WBC: 5.6 10*3/uL (ref 3.8–10.8)

## 2017-08-28 ENCOUNTER — Encounter: Payer: Self-pay | Admitting: Nurse Practitioner

## 2017-08-28 DIAGNOSIS — E1169 Type 2 diabetes mellitus with other specified complication: Secondary | ICD-10-CM | POA: Insufficient documentation

## 2017-08-28 DIAGNOSIS — E785 Hyperlipidemia, unspecified: Secondary | ICD-10-CM

## 2017-09-19 ENCOUNTER — Telehealth: Payer: Self-pay

## 2017-09-19 NOTE — Telephone Encounter (Signed)
Spoke with the patient to schedule her annual wellness visit for Tuesday 09-24-17. Patient stated that she has a meeting to go to at 10 am and it normally last a hour. Is it ok to see her after the holidays?

## 2017-09-19 NOTE — Telephone Encounter (Signed)
As long as we see her before 10/07/17, we should be good. We can see her on 12/18 after lunch if it works for her?

## 2017-09-20 NOTE — Telephone Encounter (Signed)
Unable to reach the patient. Left a voicemail for the patient to call me back. Will try to contact the patient after lunch today.

## 2017-09-22 ENCOUNTER — Other Ambulatory Visit: Payer: Self-pay | Admitting: Internal Medicine

## 2017-09-23 NOTE — Telephone Encounter (Signed)
Unable to reach the patient. Will try to contact her first thing in the morning.

## 2017-09-24 NOTE — Telephone Encounter (Signed)
Left message on patients voicemail to call the clinic back on a time that works best for her. Will try to contact later today

## 2017-09-24 NOTE — Telephone Encounter (Signed)
Patient is to be seen in clinic on 09-26-17 by Dr. Bubba Camp for AWV.

## 2017-09-24 NOTE — Telephone Encounter (Signed)
Patient to be seen Thursday 09-26-17 by Dr. Bubba Camp in clinic for Annual Wellness Visit

## 2017-09-26 ENCOUNTER — Encounter: Payer: Self-pay | Admitting: Internal Medicine

## 2017-09-26 ENCOUNTER — Non-Acute Institutional Stay: Payer: Medicare Other | Admitting: Internal Medicine

## 2017-09-26 VITALS — BP 114/62 | HR 68 | Temp 98.5°F | Resp 16 | Ht 66.0 in | Wt 148.8 lb

## 2017-09-26 DIAGNOSIS — Z0000 Encounter for general adult medical examination without abnormal findings: Secondary | ICD-10-CM | POA: Diagnosis not present

## 2017-09-26 MED ORDER — ZOSTER VAC RECOMB ADJUVANTED 50 MCG/0.5ML IM SUSR: 0.5000 mL | Freq: Once | INTRAMUSCULAR | 0 refills | Status: AC

## 2017-09-26 NOTE — Progress Notes (Signed)
Subjective:   Jacqueline Dodson is a 83 y.o. female who presents for an Initial Medicare Annual Wellness Visit.    Cardiac Risk Factors include: advanced age (>79men, >74 women);hypertension   Controlled BP, taking labetalol, telmisartan and nifedipine Exercises 3 days a week for 30 minutes.      Objective:    Today's Vitals   09/26/17 1451  BP: 114/62  Pulse: 68  Resp: 16  Temp: 98.5 F (36.9 C)  TempSrc: Oral  SpO2: 96%  Weight: 148 lb 12.8 oz (67.5 kg)  Height: 5\' 6"  (1.676 m)   Body mass index is 24.02 kg/m.  Advanced Directives 09/26/2017 08/22/2017 02/07/2017 05/31/2016 06/30/2015 05/10/2015 12/12/2014  Does Patient Have a Medical Advance Directive? Yes Yes Yes Yes Yes Yes No  Type of Paramedic of [place];Living will [place];Living will [place];Living will [place];Living will [place];Living will [place];Living will -  Does patient want to make changes to medical advance directive? - No - Patient declined No - Patient declined No - Patient declined - - -  Copy of [place] in Chart? Yes Yes Yes Yes Yes - -  Would patient like information on creating a medical advance directive? - - - - - - No - patient declined information    Current Medications (verified) Outpatient Encounter Medications as of 09/26/2017  Medication Sig  . acetaminophen (TYLENOL) 500 MG tablet Take 500 mg by mouth as needed.  . bismuth subsalicylate (PEPTO BISMOL) 262 MG chewable tablet Chew 524 mg by mouth. Take two tablets as needed for loose stools, abdominal cramping  . calcium carbonate (TUMS - DOSED IN MG ELEMENTAL CALCIUM) 500 MG chewable tablet Chew 1 tablet by mouth as needed for indigestion or heartburn.   . Cholecalciferol 1000 units tablet Take 5,000 Units by mouth daily.  Marland Kitchen gabapentin (NEURONTIN) 100 MG capsule TAKE 1 CAPSULE TWICE DAILY  . labetalol  (NORMODYNE) 100 MG tablet Take 100 mg by mouth daily.  Marland Kitchen LORazepam (ATIVAN) 0.5 MG tablet Take 0.5 mg by mouth at bedtime.  . Multiple Vitamins-Minerals (ICAPS) CAPS Take 1 capsule by mouth daily.   . Multiple Vitamins-Minerals (MULTIVITAMIN WITH MINERALS) tablet Take 1 tablet by mouth daily.  Marland Kitchen NIFEdipine (PROCARDIA XL/ADALAT-CC) 60 MG 24 hr tablet TAKE 1 TABLET DAILY FOR    BLOOD PRESSURE  . omeprazole (PRILOSEC OTC) 20 MG tablet Take 20 mg by mouth every other day.   . psyllium (METAMUCIL) 58.6 % powder Take 1 packet by mouth daily.  Marland Kitchen telmisartan (MICARDIS) 80 MG tablet TAKE 1 TABLET ONCE DAILY   FOR BLOOD PRESSURE  . [DISCONTINUED] acetaminophen (TYLENOL) 500 MG tablet Take 2 tablets (1,000 mg total) by mouth 2 (two) times daily.  . [DISCONTINUED] labetalol (NORMODYNE) 100 MG tablet Take one tablet by mouth twice daily to control blood pressure  . [DISCONTINUED] LORazepam (ATIVAN) 0.5 MG tablet Take one tablet by mouth at bedtime as needed for rest  . [DISCONTINUED] telmisartan (MICARDIS) 80 MG tablet TAKE 1 TABLET ONCE DAILY   FOR BLOOD PRESSURE   No facility-administered encounter medications on file as of 09/26/2017.     Allergies (verified) Amlodipine; Enalapril; Hctz [hydrochlorothiazide]; Penicillins; and Amoxicillin   History: Past Medical History:  Diagnosis Date  . Abnormality of gait   . Acute, but ill-defined, cerebrovascular disease   . Anxiety   . Arthritis   . Chronic kidney disease, stage II (mild)   .  Cystocele, midline   . Dermatophytosis of the body   . Disturbance of skin sensation   . Dizziness and giddiness   . Duodenal ulcer, unspecified as acute or chronic, without hemorrhage, perforation, or obstruction   . Edema   . Elevated blood pressure reading without diagnosis of hypertension   . GERD (gastroesophageal reflux disease)   . Hemorrhoids   . Hypertension   . Left bundle branch hemiblock   . Lumbago   . Macular degeneration (senile) of retina,  unspecified   . Myalgia and myositis, unspecified   . Orthostatic hypotension   . Other abnormal blood chemistry   . Other malaise and fatigue   . Personal history of fall   . Polyneuropathy in other diseases classified elsewhere ([place]) 02/16/2014  . Postmenopausal atrophic vaginitis   . 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 Rectocele   . Restless legs syndrome (RLS)   . Unspecified constipation   . Unspecified hereditary and idiopathic peripheral neuropathy   . Urgency of urination    Past Surgical History:  Procedure Laterality Date  . ABDOMINAL HYSTERECTOMY  1972  . APPENDECTOMY  1945  . CATARACT EXTRACTION Bilateral   . EXTERNAL EAR SURGERY  2010   left outer ear basel cell removed  . TONSILLECTOMY  1940   Family History  Problem Relation Age of Onset  . Aortic stenosis Mother   . Heart failure Mother   . Thrombosis Father   . Coronary artery disease Father   . Ataxia Sister   . Ataxia Brother   . Heart disease Brother   . Cancer Brother        Prostate  . Stroke Brother    Social History   Socioeconomic History  . Marital status: Widowed    Spouse name: None  . Number of children: 5  . Years of education: college  . Highest education level: None  Social Needs  . Financial resource strain: Not hard at all  . Food insecurity - worry: Never true  . Food insecurity - inability: Never true  . Transportation needs - medical: Patient refused  . Transportation needs - non-medical: Patient refused  Occupational History  . Occupation: Retired    Comment: Control and instrumentation engineer  Tobacco Use  . Smoking status: Never Smoker  . Smokeless tobacco: Never Used  Substance and Sexual Activity  . Alcohol use: No    Alcohol/week: 0.0 oz  . Drug use: No  . Sexual activity: No  Other Topics Concern  . None  Social History Narrative   Patient is widowed, with 5 children.   Patient is right handed.   Patient has college education.   Patient does not drink caffeine.     Tobacco Counseling Counseling given: Not Answered   Clinical Intake:  Pre-visit preparation completed: No  Pain : No/denies pain     BMI - recorded: 24.02 Nutritional Status: BMI of 19-24  Normal Nutritional Risks: None Diabetes: No  How often do you need to have someone help you when you read instructions, pamphlets, or other written materials from your doctor or pharmacy?: 1 - Never What is the last grade level you completed in school?: graduated from college  Interpreter Needed?: No  Information entered by :: Jaxin Fulfer   Activities of Daily Living In your present state of health, do you have any difficulty performing the following activities: 09/26/2017  Hearing? N  Vision? N  Difficulty concentrating or making decisions? N  Walking or climbing stairs? Darreld Mclean  Dressing or bathing? N  Doing errands, shopping? N  Preparing Food and eating ? N  Using the Toilet? N  In the past six months, have you accidently leaked urine? Y  Do you have problems with loss of bowel control? Y  Managing your Medications? N  Managing your Finances? N  Housekeeping or managing your Housekeeping? Y  Comment has housekeeper take care of this  Some recent data might be hidden     Immunizations and Health Maintenance Immunization History  Administered Date(s) Administered  . Influenza Whole 07/08/2012  . Influenza, High Dose Seasonal PF 07/17/2017  . Influenza-Unspecified 07/26/2014, 06/09/2015, 07/19/2016  . PPD Test 09/07/1982  . Pneumococcal Conjugate-13 09/03/2017  . Pneumococcal Polysaccharide-23 10/08/1994  . Td 10/09/1995  . Tdap 09/03/2017  . Zoster 10/08/2005   Health Maintenance Due  Topic Date Due  . HEMOGLOBIN A1C  03-31-1927  . FOOT EXAM  05/05/1939  . OPHTHALMOLOGY EXAM  05/05/1939    Patient Care Team: Blanchie Serve, MD as PCP - General (Internal Medicine) Estill Dooms, MD as PCP - Internal Medicine (Genetics) Lavonna Monarch, MD as Consulting  Physician (Dermatology) Clent Jacks, MD as Consulting Physician (Ophthalmology) Elmarie Shiley, MD as Consulting Physician (Nephrology) Zadie Rhine Clent Demark, MD as Consulting Physician (Ophthalmology) [place], [facility], Man X, NP as Nurse Practitioner (Nurse Practitioner)  Indicate any recent Medical Services you may have received from other than [facility] providers in the past year (date may be approximate).     Assessment:   This is a routine wellness examination for Jacqueline Dodson.  Hearing/Vision screen Hearing Screening Comments: Hearing is good Vision Screening Comments: Corrective glasses, has macular degeneration and impaired vision  Dietary issues and exercise activities discussed: Current Exercise Habits: Structured exercise class, Type of exercise: walking;Other - see comments(nusteps), Time (Minutes): 30, Frequency (Times/Week): 3, Weekly Exercise (Minutes/Week): 90, Intensity: Moderate, Exercise limited by: None identified  Goals    None     Depression Screen PHQ 2/9 Scores 09/26/2017 08/22/2017 08/28/2016  PHQ - 2 Score 0 0 0    Fall Risk Fall Risk  09/26/2017 08/22/2017 08/28/2016  Falls in the past year? No No No  Risk for fall due to : Impaired balance/gait;Impaired vision - -    Is the patient's home free of loose throw rugs in walkways, pet beds, electrical cords, etc?   yes      Grab bars in the bathroom? yes      Handrails on the stairs?   yes      Adequate lighting?   yes  Timed Get Up and Go Performed 6 seconds  Cognitive Function: MMSE - Mini Mental State Exam 09/26/2017  Not completed: (No Data)  Orientation to time 5  Orientation to Place 5  Registration 3  Attention/ Calculation 5  Recall 3  Language- name 2 objects 2  Language- repeat 1  Language- follow 3 step command 3  Language- read & follow direction 1  Write a sentence 1  Copy design 1  Total score 30        Screening Tests Health Maintenance  Topic Date Due  . HEMOGLOBIN A1C   Nov 09, 1926  . FOOT EXAM  05/05/1939  . OPHTHALMOLOGY EXAM  05/05/1939  . DEXA SCAN  10/08/2017 (Originally 05/04/1994)  . TETANUS/TDAP  09/04/2027  . INFLUENZA VACCINE  Completed  . PNA vac Low Risk Adult  Completed    Qualifies for Shingles Vaccine? Yes  Cancer Screenings: Lung: Low Dose CT Chest recommended if Age  55-80 years, 30 pack-year currently smoking OR have quit w/in 15years. Patient does not qualify. Breast: Up to date on Mammogram? Yes   Up to date of Bone Density/Dexa? No- does not want a dexa scan. Currently on vitamin d supplement. Takes adequate amount of milk, yoghurt, cheese and does not want calcium supplement Colorectal: N/A  Additional Screenings: N/A Hepatitis B/HIV/Syphillis: Hepatitis C Screening:      Plan:     dexa scan counselled, pt refuses to obtain dexa scan  Pt agrees for shingrix vaccine. Script to be sent to pharmacy  I have personally reviewed and noted the following in the patient's chart:   . Medical and social history . Use of alcohol, tobacco or illicit drugs  . Current medications and supplements . Functional ability and status . Nutritional status . Physical activity . Advanced directives . List of other physicians . Hospitalizations, surgeries, and ER visits in previous 12 months . Vitals . Screenings to include cognitive, depression, and falls . Referrals and appointments  In addition, I have reviewed and discussed with patient certain preventive protocols, quality metrics, and best practice recommendations. A written personalized care plan for preventive services as well as general preventive health recommendations were provided to patient.     Blanchie Serve, MD   09/26/2017

## 2017-09-30 ENCOUNTER — Other Ambulatory Visit: Payer: Self-pay | Admitting: Internal Medicine

## 2017-10-09 ENCOUNTER — Other Ambulatory Visit: Payer: Self-pay

## 2017-10-09 ENCOUNTER — Other Ambulatory Visit: Payer: Self-pay | Admitting: *Deleted

## 2017-10-09 DIAGNOSIS — N184 Chronic kidney disease, stage 4 (severe): Secondary | ICD-10-CM

## 2017-10-10 ENCOUNTER — Other Ambulatory Visit: Payer: Medicare Other

## 2017-10-10 DIAGNOSIS — N184 Chronic kidney disease, stage 4 (severe): Secondary | ICD-10-CM | POA: Diagnosis not present

## 2017-10-10 LAB — TIQ-NTM

## 2017-10-10 LAB — PTH, INTACT AND CALCIUM

## 2017-10-14 LAB — RENAL FUNCTION PANEL
Albumin: 4.1 g/dL (ref 3.6–5.1)
BUN/Creatinine Ratio: 19 (calc) (ref 6–22)
BUN: 29 mg/dL — ABNORMAL HIGH (ref 7–25)
CO2: 24 mmol/L (ref 20–32)
CREATININE: 1.53 mg/dL — AB (ref 0.60–0.88)
Calcium: 9.4 mg/dL (ref 8.6–10.4)
Chloride: 104 mmol/L (ref 98–110)
GLUCOSE: 105 mg/dL — AB (ref 65–99)
PHOSPHORUS: 4.5 mg/dL — AB (ref 2.1–4.3)
Potassium: 4.2 mmol/L (ref 3.5–5.3)
Sodium: 140 mmol/L (ref 135–146)

## 2017-10-14 LAB — CBC WITH DIFFERENTIAL/PLATELET
BASOS PCT: 0.7 %
Basophils Absolute: 47 cells/uL (ref 0–200)
Eosinophils Absolute: 342 cells/uL (ref 15–500)
Eosinophils Relative: 5.1 %
HCT: 35.3 % (ref 35.0–45.0)
Hemoglobin: 12.2 g/dL (ref 11.7–15.5)
Lymphs Abs: 2291 cells/uL (ref 850–3900)
MCH: 30.3 pg (ref 27.0–33.0)
MCHC: 34.6 g/dL (ref 32.0–36.0)
MCV: 87.6 fL (ref 80.0–100.0)
MONOS PCT: 9.9 %
MPV: 9 fL (ref 7.5–12.5)
Neutro Abs: 3357 cells/uL (ref 1500–7800)
Neutrophils Relative %: 50.1 %
Platelets: 237 10*3/uL (ref 140–400)
RBC: 4.03 10*6/uL (ref 3.80–5.10)
RDW: 12.7 % (ref 11.0–15.0)
TOTAL LYMPHOCYTE: 34.2 %
WBC: 6.7 10*3/uL (ref 3.8–10.8)
WBCMIX: 663 {cells}/uL (ref 200–950)

## 2017-10-14 LAB — VITAMIN D 25 HYDROXY (VIT D DEFICIENCY, FRACTURES): VIT D 25 HYDROXY: 52 ng/mL (ref 30–100)

## 2017-10-14 LAB — URINALYSIS, ROUTINE W REFLEX MICROSCOPIC

## 2017-10-14 LAB — MAGNESIUM: MAGNESIUM: 2.2 mg/dL (ref 1.5–2.5)

## 2017-10-17 DIAGNOSIS — N2581 Secondary hyperparathyroidism of renal origin: Secondary | ICD-10-CM | POA: Diagnosis not present

## 2017-10-17 DIAGNOSIS — D631 Anemia in chronic kidney disease: Secondary | ICD-10-CM | POA: Diagnosis not present

## 2017-10-17 DIAGNOSIS — N182 Chronic kidney disease, stage 2 (mild): Secondary | ICD-10-CM | POA: Diagnosis not present

## 2017-10-17 DIAGNOSIS — I129 Hypertensive chronic kidney disease with stage 1 through stage 4 chronic kidney disease, or unspecified chronic kidney disease: Secondary | ICD-10-CM | POA: Diagnosis not present

## 2017-10-18 ENCOUNTER — Other Ambulatory Visit: Payer: Self-pay | Admitting: *Deleted

## 2017-10-18 MED ORDER — LORAZEPAM 0.5 MG PO TABS: 0.5000 mg | ORAL_TABLET | Freq: Every day | ORAL | 0 refills | Status: DC

## 2017-10-21 ENCOUNTER — Other Ambulatory Visit: Payer: Self-pay | Admitting: *Deleted

## 2017-10-21 DIAGNOSIS — I10 Essential (primary) hypertension: Secondary | ICD-10-CM

## 2017-10-31 DIAGNOSIS — E612 Magnesium deficiency: Secondary | ICD-10-CM | POA: Diagnosis not present

## 2017-10-31 DIAGNOSIS — N182 Chronic kidney disease, stage 2 (mild): Secondary | ICD-10-CM | POA: Diagnosis not present

## 2017-11-04 ENCOUNTER — Telehealth: Payer: Self-pay | Admitting: *Deleted

## 2017-11-04 NOTE — Telephone Encounter (Signed)
Received fax from [name] stating patient is indicating to them that they Jacqueline Dodson be allergic to Nifedipine. They want to know if you still want them to dispense Procardia XL 60mg . Faxed this to [facility] and placed original fax in Dr. Jackolyn Confer box.   Reference #:[id number]

## 2017-11-04 NOTE — Telephone Encounter (Signed)
Patient has been on nifedine since 10/2015 per records with no problem. Ok to continue for now.

## 2017-11-05 NOTE — Telephone Encounter (Signed)
Faxed back to [facility] with Dr. Jackolyn Confer response.

## 2017-11-07 ENCOUNTER — Telehealth: Payer: Self-pay | Admitting: *Deleted

## 2017-11-07 NOTE — Telephone Encounter (Signed)
Spoke with patient regarding prescription clarification request, she stated that she had not talked with [facility] regarding this medication other than she was trying to see why she has not received it. She also stated that she has been taking it for years and has not had any problems with it.

## 2017-11-19 ENCOUNTER — Other Ambulatory Visit: Payer: Self-pay | Admitting: *Deleted

## 2017-11-19 MED ORDER — LORAZEPAM 0.5 MG PO TABS: 0.5000 mg | ORAL_TABLET | Freq: Every day | ORAL | 0 refills | Status: DC

## 2017-11-19 NOTE — Telephone Encounter (Signed)
Script sent by fax to [facility].

## 2017-11-26 ENCOUNTER — Other Ambulatory Visit: Payer: Self-pay

## 2017-11-26 MED ORDER — LORAZEPAM 0.5 MG PO TABS: 0.5000 mg | ORAL_TABLET | Freq: Every day | ORAL | 0 refills | Status: DC

## 2017-11-26 NOTE — Telephone Encounter (Signed)
Ms. Sherbert called the clinic requesting a 90 supply of her ativan prescription. Pharmacy on filed verified. [facility] verified.

## 2017-12-24 ENCOUNTER — Other Ambulatory Visit: Payer: Self-pay | Admitting: *Deleted

## 2017-12-24 DIAGNOSIS — F419 Anxiety disorder, unspecified: Secondary | ICD-10-CM

## 2017-12-24 MED ORDER — LORAZEPAM 0.5 MG PO TABS: 0.5000 mg | ORAL_TABLET | Freq: Every day | ORAL | 0 refills | Status: DC

## 2017-12-24 NOTE — Telephone Encounter (Signed)
Resident called regarding a refill for her ativan , she stated that it will be sent to [facility].

## 2017-12-26 DIAGNOSIS — H353222 Exudative age-related macular degeneration, left eye, with inactive choroidal neovascularization: Secondary | ICD-10-CM | POA: Diagnosis not present

## 2017-12-26 DIAGNOSIS — H353124 Nonexudative age-related macular degeneration, left eye, advanced atrophic with subfoveal involvement: Secondary | ICD-10-CM | POA: Diagnosis not present

## 2017-12-26 DIAGNOSIS — H353113 Nonexudative age-related macular degeneration, right eye, advanced atrophic without subfoveal involvement: Secondary | ICD-10-CM | POA: Diagnosis not present

## 2017-12-26 DIAGNOSIS — H353212 Exudative age-related macular degeneration, right eye, with inactive choroidal neovascularization: Secondary | ICD-10-CM | POA: Diagnosis not present

## 2017-12-31 DIAGNOSIS — M549 Dorsalgia, unspecified: Secondary | ICD-10-CM | POA: Diagnosis not present

## 2017-12-31 DIAGNOSIS — M546 Pain in thoracic spine: Secondary | ICD-10-CM | POA: Diagnosis not present

## 2017-12-31 DIAGNOSIS — R2681 Unsteadiness on feet: Secondary | ICD-10-CM | POA: Diagnosis not present

## 2017-12-31 DIAGNOSIS — Z9181 History of falling: Secondary | ICD-10-CM | POA: Diagnosis not present

## 2017-12-31 DIAGNOSIS — M6281 Muscle weakness (generalized): Secondary | ICD-10-CM | POA: Diagnosis not present

## 2018-01-01 DIAGNOSIS — R2681 Unsteadiness on feet: Secondary | ICD-10-CM | POA: Diagnosis not present

## 2018-01-01 DIAGNOSIS — Z9181 History of falling: Secondary | ICD-10-CM | POA: Diagnosis not present

## 2018-01-01 DIAGNOSIS — M546 Pain in thoracic spine: Secondary | ICD-10-CM | POA: Diagnosis not present

## 2018-01-01 DIAGNOSIS — M549 Dorsalgia, unspecified: Secondary | ICD-10-CM | POA: Diagnosis not present

## 2018-01-01 DIAGNOSIS — M6281 Muscle weakness (generalized): Secondary | ICD-10-CM | POA: Diagnosis not present

## 2018-01-03 DIAGNOSIS — M549 Dorsalgia, unspecified: Secondary | ICD-10-CM | POA: Diagnosis not present

## 2018-01-03 DIAGNOSIS — R2681 Unsteadiness on feet: Secondary | ICD-10-CM | POA: Diagnosis not present

## 2018-01-03 DIAGNOSIS — M546 Pain in thoracic spine: Secondary | ICD-10-CM | POA: Diagnosis not present

## 2018-01-03 DIAGNOSIS — M6281 Muscle weakness (generalized): Secondary | ICD-10-CM | POA: Diagnosis not present

## 2018-01-03 DIAGNOSIS — Z9181 History of falling: Secondary | ICD-10-CM | POA: Diagnosis not present

## 2018-01-07 DIAGNOSIS — M6281 Muscle weakness (generalized): Secondary | ICD-10-CM | POA: Diagnosis not present

## 2018-01-07 DIAGNOSIS — Z9181 History of falling: Secondary | ICD-10-CM | POA: Diagnosis not present

## 2018-01-07 DIAGNOSIS — R2681 Unsteadiness on feet: Secondary | ICD-10-CM | POA: Diagnosis not present

## 2018-01-07 DIAGNOSIS — M546 Pain in thoracic spine: Secondary | ICD-10-CM | POA: Diagnosis not present

## 2018-01-09 DIAGNOSIS — M6281 Muscle weakness (generalized): Secondary | ICD-10-CM | POA: Diagnosis not present

## 2018-01-09 DIAGNOSIS — M546 Pain in thoracic spine: Secondary | ICD-10-CM | POA: Diagnosis not present

## 2018-01-09 DIAGNOSIS — R2681 Unsteadiness on feet: Secondary | ICD-10-CM | POA: Diagnosis not present

## 2018-01-09 DIAGNOSIS — Z9181 History of falling: Secondary | ICD-10-CM | POA: Diagnosis not present

## 2018-01-13 DIAGNOSIS — Z9181 History of falling: Secondary | ICD-10-CM | POA: Diagnosis not present

## 2018-01-13 DIAGNOSIS — R2681 Unsteadiness on feet: Secondary | ICD-10-CM | POA: Diagnosis not present

## 2018-01-13 DIAGNOSIS — M6281 Muscle weakness (generalized): Secondary | ICD-10-CM | POA: Diagnosis not present

## 2018-01-13 DIAGNOSIS — M546 Pain in thoracic spine: Secondary | ICD-10-CM | POA: Diagnosis not present

## 2018-01-16 DIAGNOSIS — M546 Pain in thoracic spine: Secondary | ICD-10-CM | POA: Diagnosis not present

## 2018-01-16 DIAGNOSIS — R2681 Unsteadiness on feet: Secondary | ICD-10-CM | POA: Diagnosis not present

## 2018-01-16 DIAGNOSIS — Z9181 History of falling: Secondary | ICD-10-CM | POA: Diagnosis not present

## 2018-01-16 DIAGNOSIS — Z0189 Encounter for other specified special examinations: Secondary | ICD-10-CM | POA: Diagnosis not present

## 2018-01-16 DIAGNOSIS — M6281 Muscle weakness (generalized): Secondary | ICD-10-CM | POA: Diagnosis not present

## 2018-01-16 LAB — BASIC METABOLIC PANEL
BUN: 26 — AB (ref 4–21)
CREATININE: 1.6 — AB (ref <=1.1)
Glucose: 108
POTASSIUM: 4.4 (ref 3.4–5.3)
SODIUM: 138 (ref 137–147)

## 2018-01-20 DIAGNOSIS — M546 Pain in thoracic spine: Secondary | ICD-10-CM | POA: Diagnosis not present

## 2018-01-20 DIAGNOSIS — M6281 Muscle weakness (generalized): Secondary | ICD-10-CM | POA: Diagnosis not present

## 2018-01-20 DIAGNOSIS — Z9181 History of falling: Secondary | ICD-10-CM | POA: Diagnosis not present

## 2018-01-20 DIAGNOSIS — R2681 Unsteadiness on feet: Secondary | ICD-10-CM | POA: Diagnosis not present

## 2018-01-21 ENCOUNTER — Other Ambulatory Visit: Payer: Self-pay | Admitting: *Deleted

## 2018-01-21 DIAGNOSIS — D631 Anemia in chronic kidney disease: Secondary | ICD-10-CM | POA: Diagnosis not present

## 2018-01-21 DIAGNOSIS — I129 Hypertensive chronic kidney disease with stage 1 through stage 4 chronic kidney disease, or unspecified chronic kidney disease: Secondary | ICD-10-CM | POA: Diagnosis not present

## 2018-01-21 DIAGNOSIS — N2581 Secondary hyperparathyroidism of renal origin: Secondary | ICD-10-CM | POA: Diagnosis not present

## 2018-01-21 DIAGNOSIS — N183 Chronic kidney disease, stage 3 (moderate): Secondary | ICD-10-CM | POA: Diagnosis not present

## 2018-01-22 DIAGNOSIS — M6281 Muscle weakness (generalized): Secondary | ICD-10-CM | POA: Diagnosis not present

## 2018-01-22 DIAGNOSIS — M546 Pain in thoracic spine: Secondary | ICD-10-CM | POA: Diagnosis not present

## 2018-01-22 DIAGNOSIS — R2681 Unsteadiness on feet: Secondary | ICD-10-CM | POA: Diagnosis not present

## 2018-01-22 DIAGNOSIS — Z9181 History of falling: Secondary | ICD-10-CM | POA: Diagnosis not present

## 2018-01-23 ENCOUNTER — Other Ambulatory Visit: Payer: Self-pay | Admitting: *Deleted

## 2018-01-23 DIAGNOSIS — F419 Anxiety disorder, unspecified: Secondary | ICD-10-CM

## 2018-01-23 MED ORDER — LORAZEPAM 0.5 MG PO TABS: 0.5000 mg | ORAL_TABLET | Freq: Every day | ORAL | 0 refills | Status: DC

## 2018-01-27 DIAGNOSIS — M546 Pain in thoracic spine: Secondary | ICD-10-CM | POA: Diagnosis not present

## 2018-01-27 DIAGNOSIS — Z9181 History of falling: Secondary | ICD-10-CM | POA: Diagnosis not present

## 2018-01-27 DIAGNOSIS — R2681 Unsteadiness on feet: Secondary | ICD-10-CM | POA: Diagnosis not present

## 2018-01-27 DIAGNOSIS — M6281 Muscle weakness (generalized): Secondary | ICD-10-CM | POA: Diagnosis not present

## 2018-01-28 ENCOUNTER — Other Ambulatory Visit: Payer: Self-pay | Admitting: *Deleted

## 2018-01-29 DIAGNOSIS — M6281 Muscle weakness (generalized): Secondary | ICD-10-CM | POA: Diagnosis not present

## 2018-01-29 DIAGNOSIS — Z9181 History of falling: Secondary | ICD-10-CM | POA: Diagnosis not present

## 2018-01-29 DIAGNOSIS — R2681 Unsteadiness on feet: Secondary | ICD-10-CM | POA: Diagnosis not present

## 2018-01-29 DIAGNOSIS — M546 Pain in thoracic spine: Secondary | ICD-10-CM | POA: Diagnosis not present

## 2018-02-04 ENCOUNTER — Other Ambulatory Visit: Payer: Self-pay | Admitting: Internal Medicine

## 2018-02-04 DIAGNOSIS — M546 Pain in thoracic spine: Secondary | ICD-10-CM | POA: Diagnosis not present

## 2018-02-04 DIAGNOSIS — M6281 Muscle weakness (generalized): Secondary | ICD-10-CM | POA: Diagnosis not present

## 2018-02-04 DIAGNOSIS — R2681 Unsteadiness on feet: Secondary | ICD-10-CM | POA: Diagnosis not present

## 2018-02-04 DIAGNOSIS — Z9181 History of falling: Secondary | ICD-10-CM | POA: Diagnosis not present

## 2018-02-05 DIAGNOSIS — M546 Pain in thoracic spine: Secondary | ICD-10-CM | POA: Diagnosis not present

## 2018-02-05 DIAGNOSIS — M6281 Muscle weakness (generalized): Secondary | ICD-10-CM | POA: Diagnosis not present

## 2018-02-05 DIAGNOSIS — Z9181 History of falling: Secondary | ICD-10-CM | POA: Diagnosis not present

## 2018-02-05 DIAGNOSIS — R2681 Unsteadiness on feet: Secondary | ICD-10-CM | POA: Diagnosis not present

## 2018-02-06 ENCOUNTER — Encounter: Payer: Medicare Other | Admitting: Nurse Practitioner

## 2018-02-11 DIAGNOSIS — Z9181 History of falling: Secondary | ICD-10-CM | POA: Diagnosis not present

## 2018-02-11 DIAGNOSIS — M6281 Muscle weakness (generalized): Secondary | ICD-10-CM | POA: Diagnosis not present

## 2018-02-11 DIAGNOSIS — R2681 Unsteadiness on feet: Secondary | ICD-10-CM | POA: Diagnosis not present

## 2018-02-11 DIAGNOSIS — M546 Pain in thoracic spine: Secondary | ICD-10-CM | POA: Diagnosis not present

## 2018-02-14 DIAGNOSIS — M6281 Muscle weakness (generalized): Secondary | ICD-10-CM | POA: Diagnosis not present

## 2018-02-14 DIAGNOSIS — Z9181 History of falling: Secondary | ICD-10-CM | POA: Diagnosis not present

## 2018-02-14 DIAGNOSIS — M546 Pain in thoracic spine: Secondary | ICD-10-CM | POA: Diagnosis not present

## 2018-02-14 DIAGNOSIS — R2681 Unsteadiness on feet: Secondary | ICD-10-CM | POA: Diagnosis not present

## 2018-02-17 ENCOUNTER — Encounter: Payer: Self-pay | Admitting: Nurse Practitioner

## 2018-02-18 DIAGNOSIS — Z9181 History of falling: Secondary | ICD-10-CM | POA: Diagnosis not present

## 2018-02-18 DIAGNOSIS — R2681 Unsteadiness on feet: Secondary | ICD-10-CM | POA: Diagnosis not present

## 2018-02-18 DIAGNOSIS — M546 Pain in thoracic spine: Secondary | ICD-10-CM | POA: Diagnosis not present

## 2018-02-18 DIAGNOSIS — M6281 Muscle weakness (generalized): Secondary | ICD-10-CM | POA: Diagnosis not present

## 2018-02-20 DIAGNOSIS — M546 Pain in thoracic spine: Secondary | ICD-10-CM | POA: Diagnosis not present

## 2018-02-20 DIAGNOSIS — R2681 Unsteadiness on feet: Secondary | ICD-10-CM | POA: Diagnosis not present

## 2018-02-20 DIAGNOSIS — M6281 Muscle weakness (generalized): Secondary | ICD-10-CM | POA: Diagnosis not present

## 2018-02-20 DIAGNOSIS — Z9181 History of falling: Secondary | ICD-10-CM | POA: Diagnosis not present

## 2018-02-25 ENCOUNTER — Other Ambulatory Visit: Payer: Self-pay | Admitting: *Deleted

## 2018-02-25 DIAGNOSIS — F419 Anxiety disorder, unspecified: Secondary | ICD-10-CM

## 2018-02-25 DIAGNOSIS — M6281 Muscle weakness (generalized): Secondary | ICD-10-CM | POA: Diagnosis not present

## 2018-02-25 DIAGNOSIS — M546 Pain in thoracic spine: Secondary | ICD-10-CM | POA: Diagnosis not present

## 2018-02-25 DIAGNOSIS — Z9181 History of falling: Secondary | ICD-10-CM | POA: Diagnosis not present

## 2018-02-25 DIAGNOSIS — R2681 Unsteadiness on feet: Secondary | ICD-10-CM | POA: Diagnosis not present

## 2018-02-25 NOTE — Telephone Encounter (Signed)
Patient called requesting her Ativan medication to be refilled.

## 2018-02-26 MED ORDER — LORAZEPAM 0.5 MG PO TABS: 0.5000 mg | ORAL_TABLET | Freq: Every day | ORAL | 0 refills | Status: DC

## 2018-03-18 ENCOUNTER — Other Ambulatory Visit: Payer: Self-pay | Admitting: Internal Medicine

## 2018-03-25 ENCOUNTER — Other Ambulatory Visit: Payer: Self-pay | Admitting: Nurse Practitioner

## 2018-03-25 DIAGNOSIS — F419 Anxiety disorder, unspecified: Secondary | ICD-10-CM

## 2018-03-25 MED ORDER — LORAZEPAM 0.5 MG PO TABS: 0.5000 mg | ORAL_TABLET | Freq: Every day | ORAL | 0 refills | Status: DC

## 2018-04-17 DIAGNOSIS — H353124 Nonexudative age-related macular degeneration, left eye, advanced atrophic with subfoveal involvement: Secondary | ICD-10-CM | POA: Diagnosis not present

## 2018-04-17 DIAGNOSIS — H353222 Exudative age-related macular degeneration, left eye, with inactive choroidal neovascularization: Secondary | ICD-10-CM | POA: Diagnosis not present

## 2018-04-17 DIAGNOSIS — H353212 Exudative age-related macular degeneration, right eye, with inactive choroidal neovascularization: Secondary | ICD-10-CM | POA: Diagnosis not present

## 2018-04-17 DIAGNOSIS — H353113 Nonexudative age-related macular degeneration, right eye, advanced atrophic without subfoveal involvement: Secondary | ICD-10-CM | POA: Diagnosis not present

## 2018-04-25 ENCOUNTER — Other Ambulatory Visit: Payer: Self-pay

## 2018-04-25 DIAGNOSIS — F419 Anxiety disorder, unspecified: Secondary | ICD-10-CM

## 2018-04-25 MED ORDER — LORAZEPAM 0.5 MG PO TABS: 0.5000 mg | ORAL_TABLET | Freq: Every day | ORAL | 0 refills | Status: DC

## 2018-04-25 NOTE — Telephone Encounter (Signed)
[facility] Database Verified  Last refill 03/25/18

## 2018-05-21 ENCOUNTER — Encounter: Payer: Self-pay | Admitting: Internal Medicine

## 2018-05-26 ENCOUNTER — Encounter: Payer: Self-pay | Admitting: Adult Health

## 2018-05-26 ENCOUNTER — Ambulatory Visit (INDEPENDENT_AMBULATORY_CARE_PROVIDER_SITE_OTHER): Payer: Medicare Other | Admitting: Adult Health

## 2018-05-26 VITALS — BP 110/62 | HR 64 | Ht 66.0 in | Wt 145.0 lb

## 2018-05-26 DIAGNOSIS — G609 Hereditary and idiopathic neuropathy, unspecified: Secondary | ICD-10-CM

## 2018-05-26 DIAGNOSIS — G2581 Restless legs syndrome: Secondary | ICD-10-CM | POA: Diagnosis not present

## 2018-05-26 NOTE — Progress Notes (Signed)
I have read the note, and I agree with the clinical assessment and plan.  Jacqueline Dodson   

## 2018-05-26 NOTE — Progress Notes (Signed)
PATIENT: Jacqueline Dodson DOB: 05-22-1927  REASON FOR VISIT: follow up HISTORY FROM: patient  HISTORY OF PRESENT ILLNESS: Today 05/26/18  Jacqueline Dodson is an 84 year old female with a history of neuropathy.  She returns today for follow-up.  Continues to have burning sensations in the bottom of the feet primarily in the afternoons.  She states this is also when she gets restless legs.  She denies any significant changes with her gait or balance.  She does use a Rollator when ambulating.  She reports that she had a fall in March.  Afterwards she did go through some physical therapy which was beneficial.  She states that she does not feel as strong as she used to be.  She reports that she never tried Mirapex.  She states that she does not want to necessarily add on any new medication at this time.  She returns today for evaluation.  HISTORY 05/20/17 Jacqueline Dodson is an 84 year old female with a history of peripheral neuropathy. She returns today for follow-up. She continues on gabapentin 100 mg in the morning and 100 mg in the evenings. She states in the past she tried taking 300 mg daily but it caused ankle swelling. She states that she has a lot of restlessness in the evenings as well as tingling and burning in the legs. She states that the restlessness bothers her the most. She denies any changes in her gait or balance. Denies any falls. She is currently using a Rollator. She returns today for an evaluation.   REVIEW OF SYSTEMS: Out of a complete 14 system review of symptoms, the patient complains only of the following symptoms, and all other reviewed systems are negative.  Incontinence of bladder, weakness, insomnia, incontinence of bowel  ALLERGIES: Allergies  Allergen Reactions  . Amlodipine Other (See Comments)    Reaction:  Critically decreased sodium and potassium levels.    . Enalapril Other (See Comments)    Reaction:  Unknown   . Hctz [Hydrochlorothiazide] Other (See Comments)    Reaction:   Unknown   . Penicillins Other (See Comments)    Reaction:  Unknown   . Amoxicillin Swelling and Rash    HOME MEDICATIONS: Outpatient Medications Prior to Visit  Medication Sig Dispense Refill  . acetaminophen (TYLENOL) 500 MG tablet Take 500 mg by mouth as needed.    . bismuth subsalicylate (PEPTO BISMOL) 262 MG chewable tablet Chew 524 mg by mouth. Take two tablets as needed for loose stools, abdominal cramping    . calcium carbonate (TUMS - DOSED IN MG ELEMENTAL CALCIUM) 500 MG chewable tablet Chew 1 tablet by mouth as needed for indigestion or heartburn.     . Cholecalciferol 1000 units tablet Take 5,000 Units by mouth daily.    Marland Kitchen gabapentin (NEURONTIN) 100 MG capsule TAKE 1 CAPSULE TWICE DAILY 180 capsule 3  . labetalol (NORMODYNE) 100 MG tablet TAKE 1 TABLET TWICE A DAY  TO CONTROL BLOOD PRESSURE 180 tablet 1  . labetalol (NORMODYNE) 100 MG tablet TAKE 1 TABLET DAILY TO CONTROL BLOOD PRESSURE 90 tablet 3  . LORazepam (ATIVAN) 0.5 MG tablet Take 1 tablet (0.5 mg total) by mouth at bedtime. 30 tablet 0  . Multiple Vitamins-Minerals (ICAPS) CAPS Take 1 capsule by mouth daily.     . Multiple Vitamins-Minerals (MULTIVITAMIN WITH MINERALS) tablet Take 1 tablet by mouth daily.    Marland Kitchen NIFEdipine (PROCARDIA XL/ADALAT-CC) 60 MG 24 hr tablet TAKE 1 TABLET DAILY FOR    BLOOD PRESSURE 90 tablet 1  .  NIFEdipine (PROCARDIA XL/ADALAT-CC) 60 MG 24 hr tablet TAKE 1 TABLET DAILY FOR    BLOOD PRESSURE 90 tablet 1  . omeprazole (PRILOSEC OTC) 20 MG tablet Take 20 mg by mouth every other day.     . Probiotic Product (PROBIOTIC ADVANCED PO) Take by mouth.    . psyllium (METAMUCIL) 58.6 % powder Take 1 packet by mouth daily.    Marland Kitchen telmisartan (MICARDIS) 80 MG tablet TAKE 1 TABLET ONCE DAILY   FOR BLOOD PRESSURE 90 tablet 3   No facility-administered medications prior to visit.     PAST MEDICAL HISTORY: Past Medical History:  Diagnosis Date  . Abnormality of gait   . Acute, but ill-defined, cerebrovascular  disease   . Anxiety   . Arthritis   . Chronic kidney disease, stage II (mild)   . Cystocele, midline   . Dermatophytosis of the body   . Disturbance of skin sensation   . Dizziness and giddiness   . Duodenal ulcer, unspecified as acute or chronic, without hemorrhage, perforation, or obstruction   . Edema   . Elevated blood pressure reading without diagnosis of hypertension   . GERD (gastroesophageal reflux disease)   . Hemorrhoids   . Hypertension   . Left bundle branch hemiblock   . Lumbago   . Macular degeneration (senile) of retina, unspecified   . Myalgia and myositis, unspecified   . Orthostatic hypotension   . Other abnormal blood chemistry   . Other malaise and fatigue   . Personal history of fall   . Polyneuropathy in other diseases classified elsewhere ([place]) 02/16/2014  . Postmenopausal atrophic vaginitis   . Primary cerebellar degeneration ([place])   . Rectocele   . Restless legs syndrome (RLS)   . Unspecified constipation   . Unspecified hereditary and idiopathic peripheral neuropathy   . Urgency of urination     PAST SURGICAL HISTORY: Past Surgical History:  Procedure Laterality Date  . ABDOMINAL HYSTERECTOMY  1972  . APPENDECTOMY  1945  . CATARACT EXTRACTION Bilateral   . EXTERNAL EAR SURGERY  2010   left outer ear basel cell removed  . TONSILLECTOMY  1940    FAMILY HISTORY: Family History  Problem Relation Age of Onset  . Aortic stenosis Mother   . Heart failure Mother   . Thrombosis Father   . Coronary artery disease Father   . Ataxia Sister   . Ataxia Brother   . Heart disease Brother   . Cancer Brother        Prostate  . Stroke Brother     SOCIAL HISTORY: Social History   Socioeconomic History  . Marital status: Widowed    Spouse name: Not on file  . Number of children: 5  . Years of education: college  . Highest education level: Not on file  Occupational History  . Occupation: Retired    Comment: Control and instrumentation engineer  Social Needs  .  Financial resource strain: Not hard at all  . Food insecurity:    Worry: Never true    Inability: Never true  . Transportation needs:    Medical: Patient refused    Non-medical: Patient refused  Tobacco Use  . Smoking status: Never Smoker  . Smokeless tobacco: Never Used  Substance and Sexual Activity  . Alcohol use: No    Alcohol/week: 0.0 standard drinks  . Drug use: No  . Sexual activity: Never  Lifestyle  . Physical activity:    Days per week: 3 days    Minutes per  session: 30 min  . Stress: Not at all  Relationships  . Social connections:    Talks on phone: More than three times a week    Gets together: More than three times a week    Attends religious service: More than 4 times per year    Active member of club or organization: Yes    Attends meetings of clubs or organizations: More than 4 times per year    Relationship status: Widowed  . Intimate partner violence:    Fear of current or ex partner: No    Emotionally abused: No    Physically abused: No    Forced sexual activity: No  Other Topics Concern  . Not on file  Social History Narrative   Patient is widowed, with 5 children.   Patient is right handed.   Patient has college education.   Patient does not drink caffeine.      PHYSICAL EXAM  Vitals:   05/26/18 0953  BP: 110/62  Pulse: 64  SpO2: 95%  Weight: 145 lb (65.8 kg)  Height: 5\' 6"  (1.676 m)   Body mass index is 23.4 kg/m.  Generalized: Well developed, in no acute distress   Neurological examination  Mentation: Alert oriented to time, place, history taking. Follows all commands speech and language fluent Cranial nerve II-XII: Pupils were equal round reactive to light. Extraocular movements were full, visual field were full on confrontational test. Facial sensation and strength were normal. Uvula tongue midline. Head turning and shoulder shrug  were normal and symmetric. Motor: The motor testing reveals 5 over 5 strength of all 4  extremities. Good symmetric motor tone is noted throughout.  Sensory: Sensory testing is intact to soft touch on all 4 extremities. No evidence of extinction is noted.  Coordination: Cerebellar testing reveals good finger-nose-finger and heel-to-shin bilaterally.  Gait and station: Gait is normal.  She uses a Rollator when ambulating.  Tandem gait not attempted. Reflexes: Deep tendon reflexes are symmetric and normal bilaterally.   DIAGNOSTIC DATA (LABS, IMAGING, TESTING) - I reviewed patient records, labs, notes, testing and imaging myself where available.  Lab Results  Component Value Date   WBC 6.7 10/10/2017   HGB 12.2 10/10/2017   HCT 35.3 10/10/2017   MCV 87.6 10/10/2017   PLT 237 10/10/2017      Component Value Date/Time   NA 138 01/16/2018   K 4.4 01/16/2018   CL 104 10/10/2017 0725   CO2 24 10/10/2017 0725   GLUCOSE 105 (H) 10/10/2017 0725   BUN 26 (A) 01/16/2018   CREATININE 1.6 (A) 01/16/2018   CREATININE 1.53 (H) 10/10/2017 0725   CALCIUM 9.4 10/10/2017 0725   PROT 6.7 08/26/2017 0000   ALBUMIN 3.9 02/11/2017 1030   AST 20 08/26/2017 0000   ALT 18 08/26/2017 0000   ALKPHOS 55 02/11/2017 1030   BILITOT 0.4 08/26/2017 0000   GFRNONAA 33 (L) 08/26/2017 0000   GFRAA 38 (L) 08/26/2017 0000   Lab Results  Component Value Date   CHOL 241 (H) 08/26/2017   HDL 36 (L) 08/26/2017   LDLCALC 168 (H) 08/26/2017   TRIG 201 (H) 08/26/2017   CHOLHDL 6.7 (H) 08/26/2017    Lab Results  Component Value Date   TSH 2.46 02/11/2017      ASSESSMENT AND PLAN 84 y.o. year old female  has a past medical history of Abnormality of gait, Acute, but ill-defined, cerebrovascular disease, Anxiety, Arthritis, Chronic kidney disease, stage II (mild), Cystocele, midline, Dermatophytosis of the  body, Disturbance of skin sensation, Dizziness and giddiness, Duodenal ulcer, unspecified as acute or chronic, without hemorrhage, perforation, or obstruction, Edema, Elevated blood pressure  reading without diagnosis of hypertension, GERD (gastroesophageal reflux disease), Hemorrhoids, Hypertension, Left bundle branch hemiblock, Lumbago, Macular degeneration (senile) of retina, unspecified, Myalgia and myositis, unspecified, Orthostatic hypotension, Other abnormal blood chemistry, Other malaise and fatigue, Personal history of fall, Polyneuropathy in other diseases classified elsewhere ([place]) (02/16/2014), Postmenopausal atrophic vaginitis, Primary cerebellar degeneration (HCC), Rectocele, Restless legs syndrome (RLS), Unspecified constipation, Unspecified hereditary and idiopathic peripheral neuropathy, and Urgency of urination. here with:  1.  Peripheral neuropathy 2.  Restless leg syndrome  The patient primarily has symptoms in the afternoons.  Advised that we can adjust the timing of her gabapentin dose.  She will take her first dose of gabapentin at lunch and then repeat at bedtime.  If this is not beneficial for her symptoms she should let us know.  She will follow-up in 1 year or sooner if needed.    Ward Givens, MSN, NP-C 05/26/2018, 10:06 AM [facility] [address] [place], [place] [address] [phone]

## 2018-05-26 NOTE — Patient Instructions (Signed)
Your Plan:  Continue gabapentin but take first dose at lunch and then at bedtime If your symptoms worsen or you develop new symptoms please let us know.   Thank you for coming to see [place] at [facility]. I hope we have been able to provide you high quality care today.  You may receive a patient satisfaction survey over the next few weeks. We would appreciate your feedback and comments so that we may continue to improve ourselves and the health of our patients.

## 2018-05-30 ENCOUNTER — Telehealth: Payer: Self-pay | Admitting: *Deleted

## 2018-05-30 DIAGNOSIS — F419 Anxiety disorder, unspecified: Secondary | ICD-10-CM

## 2018-05-30 MED ORDER — LORAZEPAM 0.5 MG PO TABS: 0.5000 mg | ORAL_TABLET | Freq: Every day | ORAL | 0 refills | Status: DC

## 2018-06-02 ENCOUNTER — Other Ambulatory Visit: Payer: Self-pay | Admitting: *Deleted

## 2018-06-02 MED ORDER — LABETALOL HCL 100 MG PO TABS: ORAL_TABLET | ORAL | 1 refills | Status: DC

## 2018-06-02 NOTE — Telephone Encounter (Signed)
Patient called regarding her medication(Labetalol) script, she stated that she was almost out and wanted to [facility] and the rest from [place] home delivery. I asked her to call me back when she was almost finished so that the script is not mixed up in transit.

## 2018-06-10 ENCOUNTER — Other Ambulatory Visit: Payer: Self-pay

## 2018-06-10 MED ORDER — GABAPENTIN 100 MG PO CAPS: 100.0000 mg | ORAL_CAPSULE | Freq: Two times a day (BID) | ORAL | 1 refills | Status: DC

## 2018-06-10 NOTE — Telephone Encounter (Signed)
Refill for gabapentin submitted for a 90 with 1 refill to [facility].  MB RN.

## 2018-06-11 ENCOUNTER — Telehealth: Payer: Self-pay | Admitting: *Deleted

## 2018-06-11 NOTE — Telephone Encounter (Signed)
Patient called regarding her Labetalol, she stated that her medication was suppose to be a quanity of 180 instead of 90 tablet ManXie Mast changed that last visit. I gave a verbal order to the pharmacist to make this change.

## 2018-06-30 ENCOUNTER — Other Ambulatory Visit: Payer: Self-pay | Admitting: *Deleted

## 2018-06-30 DIAGNOSIS — F419 Anxiety disorder, unspecified: Secondary | ICD-10-CM

## 2018-07-03 ENCOUNTER — Other Ambulatory Visit: Payer: Self-pay | Admitting: *Deleted

## 2018-07-03 DIAGNOSIS — F419 Anxiety disorder, unspecified: Secondary | ICD-10-CM

## 2018-07-03 MED ORDER — LORAZEPAM 0.5 MG PO TABS: 0.5000 mg | ORAL_TABLET | Freq: Every day | ORAL | 0 refills | Status: DC

## 2018-07-29 ENCOUNTER — Other Ambulatory Visit: Payer: Self-pay | Admitting: *Deleted

## 2018-07-29 DIAGNOSIS — F419 Anxiety disorder, unspecified: Secondary | ICD-10-CM

## 2018-07-29 MED ORDER — LORAZEPAM 0.5 MG PO TABS: 0.5000 mg | ORAL_TABLET | Freq: Every day | ORAL | 0 refills | Status: DC

## 2018-07-31 ENCOUNTER — Telehealth: Payer: Self-pay | Admitting: *Deleted

## 2018-07-31 DIAGNOSIS — I10 Essential (primary) hypertension: Secondary | ICD-10-CM

## 2018-07-31 MED ORDER — NIFEDIPINE ER OSMOTIC RELEASE 60 MG PO TB24: ORAL_TABLET | ORAL | 1 refills | Status: DC

## 2018-08-04 NOTE — Telephone Encounter (Signed)
Patient called regarding refill on her medication (nifedipine 60 mg).

## 2018-08-11 ENCOUNTER — Telehealth: Payer: Self-pay | Admitting: *Deleted

## 2018-08-11 NOTE — Telephone Encounter (Signed)
Patient called regarding her medication (Ativan 0.5mg ) she stated that her medication has not arrived yet when it was sent in on 07/29/2018. She also stated that she received a confirmation fron [facility] but her medication never arrived. I informed her that I would give them a call to see what was the problem and will call her back.

## 2018-08-11 NOTE — Telephone Encounter (Signed)
Spoke with [facility] regarding her Ativan, they stated that some how they didn't have the correct DEA # and couldn't get in touch with the office. So they cancelled her order for medication. They are going to send it out tomorrow for her

## 2018-08-13 ENCOUNTER — Telehealth: Payer: Self-pay | Admitting: *Deleted

## 2018-08-13 DIAGNOSIS — H35371 Puckering of macula, right eye: Secondary | ICD-10-CM | POA: Diagnosis not present

## 2018-08-13 DIAGNOSIS — H4321 Crystalline deposits in vitreous body, right eye: Secondary | ICD-10-CM | POA: Diagnosis not present

## 2018-08-13 DIAGNOSIS — H40013 Open angle with borderline findings, low risk, bilateral: Secondary | ICD-10-CM | POA: Diagnosis not present

## 2018-08-13 DIAGNOSIS — Z961 Presence of intraocular lens: Secondary | ICD-10-CM | POA: Diagnosis not present

## 2018-08-13 DIAGNOSIS — H353132 Nonexudative age-related macular degeneration, bilateral, intermediate dry stage: Secondary | ICD-10-CM | POA: Diagnosis not present

## 2018-08-13 NOTE — Telephone Encounter (Signed)
Patient called regarding her medication (Ativan)she stated that she hasn't received it yet. I informed her that it was order on 08/11/2018. I called [facility]  requesting a tracking number if they could not tell me when her medication would arrive. Marla([facility] employee) stated that the medication was just sent out on yesterday and shouls arrive within 3-4 days. I called the patient to pass on the information.

## 2018-08-22 ENCOUNTER — Other Ambulatory Visit: Payer: Self-pay

## 2018-08-29 ENCOUNTER — Other Ambulatory Visit: Payer: Self-pay | Admitting: *Deleted

## 2018-08-29 DIAGNOSIS — F419 Anxiety disorder, unspecified: Secondary | ICD-10-CM

## 2018-08-29 MED ORDER — LORAZEPAM 0.5 MG PO TABS: 0.5000 mg | ORAL_TABLET | Freq: Every day | ORAL | 0 refills | Status: DC

## 2018-09-07 ENCOUNTER — Other Ambulatory Visit: Payer: Self-pay | Admitting: Nurse Practitioner

## 2018-09-09 ENCOUNTER — Other Ambulatory Visit: Payer: Self-pay | Admitting: *Deleted

## 2018-09-09 NOTE — Telephone Encounter (Signed)
Received refill request from [facility] for refill on [place].   Pended and sent to [facility] Mast, NP for approval due to HIGH Warning Alert.

## 2018-09-10 MED ORDER — TELMISARTAN 80 MG PO TABS: ORAL_TABLET | ORAL | 1 refills | Status: DC

## 2018-10-02 ENCOUNTER — Non-Acute Institutional Stay: Payer: Medicare Other | Admitting: Nurse Practitioner

## 2018-10-02 ENCOUNTER — Encounter: Payer: Self-pay | Admitting: Nurse Practitioner

## 2018-10-02 ENCOUNTER — Encounter: Payer: Self-pay | Admitting: *Deleted

## 2018-10-02 DIAGNOSIS — E785 Hyperlipidemia, unspecified: Secondary | ICD-10-CM

## 2018-10-02 DIAGNOSIS — N183 Chronic kidney disease, stage 3 unspecified: Secondary | ICD-10-CM

## 2018-10-02 DIAGNOSIS — E1169 Type 2 diabetes mellitus with other specified complication: Secondary | ICD-10-CM | POA: Diagnosis not present

## 2018-10-02 DIAGNOSIS — I130 Hypertensive heart and chronic kidney disease with heart failure and stage 1 through stage 4 chronic kidney disease, or unspecified chronic kidney disease: Secondary | ICD-10-CM | POA: Diagnosis not present

## 2018-10-02 DIAGNOSIS — G609 Hereditary and idiopathic neuropathy, unspecified: Secondary | ICD-10-CM | POA: Diagnosis not present

## 2018-10-02 DIAGNOSIS — R269 Unspecified abnormalities of gait and mobility: Secondary | ICD-10-CM | POA: Diagnosis not present

## 2018-10-02 DIAGNOSIS — F419 Anxiety disorder, unspecified: Secondary | ICD-10-CM

## 2018-10-02 DIAGNOSIS — I10 Essential (primary) hypertension: Secondary | ICD-10-CM | POA: Diagnosis not present

## 2018-10-02 DIAGNOSIS — Z0000 Encounter for general adult medical examination without abnormal findings: Secondary | ICD-10-CM

## 2018-10-02 DIAGNOSIS — K219 Gastro-esophageal reflux disease without esophagitis: Secondary | ICD-10-CM

## 2018-10-02 DIAGNOSIS — F411 Generalized anxiety disorder: Secondary | ICD-10-CM | POA: Diagnosis not present

## 2018-10-02 NOTE — Assessment & Plan Note (Addendum)
Blood pressure is controlled on Labetalol '100mg'$  bid, Nifedipine '60mg'$  qd, Telmisartan '80mg'$  qd. Update CMP eGFR

## 2018-10-02 NOTE — Assessment & Plan Note (Signed)
Update lipid panel, Hgb a1c.

## 2018-10-02 NOTE — Assessment & Plan Note (Signed)
Blood pressure is controlled

## 2018-10-02 NOTE — Assessment & Plan Note (Signed)
Creat 1.6 01/16/18 at her baseline, pending Nephrology consultation, repeat CMP eGFR

## 2018-10-02 NOTE — Assessment & Plan Note (Signed)
No recent falls, continue ambulating with walker.

## 2018-10-02 NOTE — Assessment & Plan Note (Addendum)
Continue to sleep and managing mood with Lorazepam 0.5mg  qhs, observe for s/s of adverse side effects. Update TSH

## 2018-10-02 NOTE — Assessment & Plan Note (Signed)
Stable, continue Gabapentin 100mg  bid. Continue to ambulate with walker.

## 2018-10-02 NOTE — Progress Notes (Signed)
Location:   Clinic [facility]   Place of Service:  Clinic (12) Provider: Marlana Latus NP  Code Status: DNR Goals of Care: [place] 09/26/2017  Does Patient Have a Medical Advance Directive? Yes  Type of Paramedic of [place];Living will  Does patient want to make changes to medical advance directive? -  Copy of [place] in Chart? Yes  Would patient like information on creating a medical advance directive? -     Chief Complaint  Patient presents with  . Medical Management of Chronic Issues    1 yr f/u    HPI: Patient is a 84 y.o. female seen today for medical management of chronic diseases.     The patient has history of HTN, blood pressure is controlled on Telmisartan '80mg'$  qd, Nifedipine '60mg'$  qd, Labetalol '100mg'$  bid.  Anxiety/insomnia, stable on Lorazepam 0.'5mg'$  qd. She takes Gabapentin '100mg'$  bid for neuropathic pain in BLE.  GERD stable on Omeprazole '20mg'$  qod,   Past Medical History:  Diagnosis Date  . Abnormality of gait   . Acute, but ill-defined, cerebrovascular disease   . Anxiety   . Arthritis   . Chronic kidney disease, stage II (mild)   . Cystocele, midline   . Dermatophytosis of the body   . Disturbance of skin sensation   . Dizziness and giddiness   . Duodenal ulcer, unspecified as acute or chronic, without hemorrhage, perforation, or obstruction   . Edema   . Elevated blood pressure reading without diagnosis of hypertension   . GERD (gastroesophageal reflux disease)   . Hemorrhoids   . Hypertension   . Left bundle branch hemiblock   . Lumbago   . Macular degeneration (senile) of retina, unspecified   . Myalgia and myositis, unspecified   . Orthostatic hypotension   . Other abnormal blood chemistry   . Other malaise and fatigue   . Personal history of fall   . Polyneuropathy in other diseases classified elsewhere ([place]) 02/16/2014  . Postmenopausal atrophic vaginitis   . Primary cerebellar degeneration  ([place])   . Rectocele   . Restless legs syndrome (RLS)   . Unspecified constipation   . Unspecified hereditary and idiopathic peripheral neuropathy   . Urgency of urination     Past Surgical History:  Procedure Laterality Date  . ABDOMINAL HYSTERECTOMY  1972  . APPENDECTOMY  1945  . CATARACT EXTRACTION Bilateral   . EXTERNAL EAR SURGERY  2010   left outer ear basel cell removed  . TONSILLECTOMY  1940    Allergies  Allergen Reactions  . Amlodipine Other (See Comments)    Reaction:  Critically decreased sodium and potassium levels.    . Enalapril Other (See Comments)    Reaction:  Unknown   . Hctz [Hydrochlorothiazide] Other (See Comments)    Reaction:  Unknown   . Penicillins Other (See Comments)    Reaction:  Unknown   . Amoxicillin Swelling and Rash    Allergies as of 10/02/2018      Reactions   Amlodipine Other (See Comments)   Reaction:  Critically decreased sodium and potassium levels.     Enalapril Other (See Comments)   Reaction:  Unknown    Hctz [hydrochlorothiazide] Other (See Comments)   Reaction:  Unknown    Penicillins Other (See Comments)   Reaction:  Unknown    Amoxicillin Swelling, Rash      Medication List       Accurate as of October 02, 2018 11:59  PM. Always use your most recent med list.        acetaminophen 500 MG tablet Commonly known as:  TYLENOL Take 500 mg by mouth as needed.   bismuth subsalicylate 778 MG chewable tablet Commonly known as:  PEPTO BISMOL Chew 524 mg by mouth. Take two tablets as needed for loose stools, abdominal cramping   calcium carbonate 500 MG chewable tablet Commonly known as:  TUMS - dosed in mg elemental calcium Chew 1 tablet by mouth as needed for indigestion or heartburn.   gabapentin 100 MG capsule Commonly known as:  NEURONTIN Take 1 capsule (100 mg total) by mouth 2 (two) times daily.   multivitamin with minerals tablet Take 1 tablet by mouth daily.   ICAPS Caps Take 1 capsule by mouth daily.     labetalol 100 MG tablet Commonly known as:  NORMODYNE TAKE 1 TABLET TWICE A DAY  TO CONTROL BLOOD PRESSURE   LORazepam 0.5 MG tablet Commonly known as:  ATIVAN Take 1 tablet (0.5 mg total) by mouth at bedtime.   NIFEdipine 60 MG 24 hr tablet Commonly known as:  PROCARDIA XL/NIFEDICAL XL TAKE 1 TABLET DAILY FOR    BLOOD PRESSURE   NIFEdipine 60 MG 24 hr tablet Commonly known as:  PROCARDIA XL/NIFEDICAL XL TAKE 1 TABLET DAILY FOR    BLOOD PRESSURE   omeprazole 20 MG tablet Commonly known as:  PRILOSEC OTC Take 20 mg by mouth every other day.   PROBIOTIC ADVANCED PO Take by mouth.   psyllium 58.6 % powder Commonly known as:  METAMUCIL Take 1 packet by mouth every other day.   telmisartan 80 MG tablet Commonly known as:  MICARDIS Take one tablet by mouth once daily for blood pressure       Review of Systems:  Review of Systems  Constitutional: Negative for activity change, appetite change, chills, diaphoresis, fatigue, fever and unexpected weight change.  HENT: Positive for hearing loss and rhinorrhea. Negative for congestion, trouble swallowing and voice change.        Sneezing/running nose when cold air.   Respiratory: Negative for cough, shortness of breath and wheezing.   Cardiovascular: Negative for chest pain, palpitations and leg swelling.  Gastrointestinal: Negative for abdominal distention, abdominal pain, constipation, diarrhea, nausea and vomiting.       Fecal incontinence.   Genitourinary: Positive for frequency. Negative for difficulty urinating, dysuria and urgency.       Incontinent of urine. 2-3x/night  Musculoskeletal: Positive for arthralgias and gait problem. Negative for back pain.       Last fall 12/2017  Skin: Negative for color change and pallor.  Neurological: Negative for dizziness, speech difficulty, weakness and light-headedness.       Memory lapses. Prickling sensation BLE  Psychiatric/Behavioral: Positive for sleep disturbance. Negative for  agitation, behavioral problems and hallucinations. The patient is nervous/anxious.     Health Maintenance  Topic Date Due  . HEMOGLOBIN A1C  11/22/28  . FOOT EXAM  05/05/1939  . OPHTHALMOLOGY EXAM  05/05/1939  . DEXA SCAN  05/04/1994  . TETANUS/TDAP  09/04/2027  . INFLUENZA VACCINE  Completed  . PNA vac Low Risk Adult  Completed    Physical Exam: Vitals:   10/02/18 1421  BP: 110/60  Pulse: 75  Resp: 18  Temp: 98 F (36.7 C)  TempSrc: Oral  SpO2: 96%  Weight: 153 lb (69.4 kg)  Height: '5\' 6"'$  (1.676 m)   Body mass index is 24.69 kg/m. Physical Exam Constitutional:  General: She is not in acute distress.    Appearance: She is not diaphoretic.  HENT:     Head: Normocephalic and atraumatic.     Nose: Nose normal.  Eyes:     Extraocular Movements: Extraocular movements intact.     Pupils: Pupils are equal, round, and reactive to light.  Cardiovascular:     Rate and Rhythm: Normal rate and regular rhythm.     Heart sounds: Murmur present.  Pulmonary:     Effort: Pulmonary effort is normal.     Breath sounds: Normal breath sounds. No wheezing, rhonchi or rales.  Abdominal:     General: There is no distension.     Palpations: Abdomen is soft.     Tenderness: There is no abdominal tenderness. There is no guarding.  Musculoskeletal:     Right lower leg: No edema.     Left lower leg: No edema.     Comments: Ambulate with walker.   Skin:    General: Skin is warm and dry.  Neurological:     General: No focal deficit present.     Cranial Nerves: No cranial nerve deficit.     Motor: No weakness.     Coordination: Coordination normal.     Gait: Gait abnormal.     Comments: Oriented to person and place.   Psychiatric:        Mood and Affect: Mood normal.        Behavior: Behavior normal.        Thought Content: Thought content normal.        Judgment: Judgment normal.     Labs reviewed: Basic Metabolic Panel: Recent Labs    10/09/17 0000 10/10/17 0725  01/16/18  NA  --  140 138  K  --  4.2 4.4  CL  --  104  --   CO2  --  24  --   GLUCOSE  --  105*  --   BUN  --  29* 26*  CREATININE  --  1.53* 1.6*  CALCIUM CANCELED 9.4  --   MG  --  2.2  --   PHOS  --  4.5*  --    Liver Function Tests: No results for input(s): AST, ALT, ALKPHOS, BILITOT, PROT, ALBUMIN in the last 8760 hours. No results for input(s): LIPASE, AMYLASE in the last 8760 hours. No results for input(s): AMMONIA in the last 8760 hours. CBC: Recent Labs    10/10/17 0725  WBC 6.7  NEUTROABS 3,357  HGB 12.2  HCT 35.3  MCV 87.6  PLT 237   Lipid Panel: No results for input(s): CHOL, HDL, LDLCALC, TRIG, CHOLHDL, LDLDIRECT in the last 8760 hours. No results found for: HGBA1C  Procedures since last visit: No results found.  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 Update CMP eGFR  HTN (hypertension) Blood pressure is controlled.   GERD (gastroesophageal reflux disease) Stable, continue Omeprazole '20mg'$  qd. Update CBC/diff  Peripheral neuropathy Stable, continue Gabapentin '100mg'$  bid. Continue to ambulate with walker.   Generalized anxiety disorder Continue to sleep and managing mood with Lorazepam 0.'5mg'$  qhs, observe for s/s of adverse side effects. Update TSH   Abnormality of gait No recent falls, continue ambulating with walker.   Hyperlipidemia associated with type 2 diabetes mellitus ([place]) Update lipid panel, Hgb a1c.   Chronic kidney disease, stage III (moderate) (HCC) Creat  1.6 01/16/18 at her baseline, pending Nephrology consultation, repeat CMP eGFR   Labs/tests ordered: CBC/diff, CMP eGFR, TSH lipid panel, Hgb a1c  Next appt:  6 months

## 2018-10-02 NOTE — Assessment & Plan Note (Addendum)
Stable, continue Omeprazole 20mg  qd. Update CBC/diff

## 2018-10-02 NOTE — Patient Instructions (Addendum)
CBC/diff, CMP eGFR, TSH lipid panel, Hgb a1c next week. Next appt:  6 months. F/u nephrology for chronic kidney disease.

## 2018-10-02 NOTE — Progress Notes (Signed)
Received a call from patient regarding a refill on her Ativan. This encounter was created in error - please disregard.

## 2018-10-03 ENCOUNTER — Other Ambulatory Visit: Payer: Self-pay | Admitting: *Deleted

## 2018-10-03 DIAGNOSIS — F419 Anxiety disorder, unspecified: Secondary | ICD-10-CM

## 2018-10-03 MED ORDER — LORAZEPAM 0.5 MG PO TABS: 0.5000 mg | ORAL_TABLET | Freq: Every day | ORAL | 0 refills | Status: DC

## 2018-10-03 NOTE — Telephone Encounter (Signed)
Patient called stating that her son has checked into her medication issue and that if she order this medication from [facility]. She stated that she has made arrangement with [facility].

## 2018-10-06 ENCOUNTER — Encounter: Payer: Self-pay | Admitting: Nurse Practitioner

## 2018-10-07 DIAGNOSIS — K219 Gastro-esophageal reflux disease without esophagitis: Secondary | ICD-10-CM | POA: Diagnosis not present

## 2018-10-07 DIAGNOSIS — F411 Generalized anxiety disorder: Secondary | ICD-10-CM | POA: Diagnosis not present

## 2018-10-07 DIAGNOSIS — E785 Hyperlipidemia, unspecified: Secondary | ICD-10-CM | POA: Diagnosis not present

## 2018-10-07 DIAGNOSIS — N183 Chronic kidney disease, stage 3 (moderate): Secondary | ICD-10-CM | POA: Diagnosis not present

## 2018-10-07 DIAGNOSIS — E1169 Type 2 diabetes mellitus with other specified complication: Secondary | ICD-10-CM | POA: Diagnosis not present

## 2018-10-07 DIAGNOSIS — I130 Hypertensive heart and chronic kidney disease with heart failure and stage 1 through stage 4 chronic kidney disease, or unspecified chronic kidney disease: Secondary | ICD-10-CM | POA: Diagnosis not present

## 2018-10-09 ENCOUNTER — Other Ambulatory Visit: Payer: Medicare Other

## 2018-10-09 DIAGNOSIS — N183 Chronic kidney disease, stage 3 unspecified: Secondary | ICD-10-CM

## 2018-10-09 DIAGNOSIS — I130 Hypertensive heart and chronic kidney disease with heart failure and stage 1 through stage 4 chronic kidney disease, or unspecified chronic kidney disease: Secondary | ICD-10-CM

## 2018-10-09 DIAGNOSIS — E785 Hyperlipidemia, unspecified: Principal | ICD-10-CM

## 2018-10-09 DIAGNOSIS — E1169 Type 2 diabetes mellitus with other specified complication: Secondary | ICD-10-CM

## 2018-10-09 DIAGNOSIS — K219 Gastro-esophageal reflux disease without esophagitis: Secondary | ICD-10-CM

## 2018-10-09 DIAGNOSIS — F411 Generalized anxiety disorder: Secondary | ICD-10-CM

## 2018-10-10 LAB — CBC WITH DIFFERENTIAL/PLATELET
Absolute Monocytes: 564 cells/uL (ref 200–950)
Basophils Absolute: 50 cells/uL (ref 0–200)
Basophils Relative: 0.8 %
Eosinophils Absolute: 310 cells/uL (ref 15–500)
Eosinophils Relative: 5 %
HCT: 33.6 % — ABNORMAL LOW (ref 35.0–45.0)
Hemoglobin: 11.5 g/dL — ABNORMAL LOW (ref 11.7–15.5)
Lymphs Abs: 2654 cells/uL (ref 850–3900)
MCH: 30.3 pg (ref 27.0–33.0)
MCHC: 34.2 g/dL (ref 32.0–36.0)
MCV: 88.4 fL (ref 80.0–100.0)
MPV: 9.8 fL (ref 7.5–12.5)
Monocytes Relative: 9.1 %
NEUTROS PCT: 42.3 %
Neutro Abs: 2623 cells/uL (ref 1500–7800)
Platelets: 229 10*3/uL (ref 140–400)
RBC: 3.8 10*6/uL (ref 3.80–5.10)
RDW: 12.3 % (ref 11.0–15.0)
Total Lymphocyte: 42.8 %
WBC: 6.2 10*3/uL (ref 3.8–10.8)

## 2018-10-10 LAB — COMPLETE METABOLIC PANEL WITH GFR
AG Ratio: 1.4 (calc) (ref 1.0–2.5)
ALT: 18 U/L (ref 6–29)
AST: 21 U/L (ref 10–35)
Albumin: 3.9 g/dL (ref 3.6–5.1)
Alkaline phosphatase (APISO): 74 U/L (ref 33–130)
BUN/Creatinine Ratio: 18 (calc) (ref 6–22)
BUN: 29 mg/dL — ABNORMAL HIGH (ref 7–25)
CALCIUM: 9.3 mg/dL (ref 8.6–10.4)
CO2: 24 mmol/L (ref 20–32)
Chloride: 106 mmol/L (ref 98–110)
Creat: 1.64 mg/dL — ABNORMAL HIGH (ref 0.60–0.88)
GFR, EST NON AFRICAN AMERICAN: 27 mL/min/{1.73_m2} — AB (ref >=60)
GFR, Est African American: 32 mL/min/{1.73_m2} — ABNORMAL LOW (ref >=60)
Globulin: 2.8 g/dL (calc) (ref 1.9–3.7)
Glucose, Bld: 108 mg/dL — ABNORMAL HIGH (ref 65–99)
Potassium: 4.1 mmol/L (ref 3.5–5.3)
Sodium: 140 mmol/L (ref 135–146)
Total Bilirubin: 0.3 mg/dL (ref 0.2–1.2)
Total Protein: 6.7 g/dL (ref 6.1–8.1)

## 2018-10-10 LAB — LIPID PANEL
Cholesterol: 238 mg/dL — ABNORMAL HIGH (ref <200)
HDL: 34 mg/dL — AB (ref >50)
LDL Cholesterol (Calc): 166 mg/dL (calc) — ABNORMAL HIGH
Non-HDL Cholesterol (Calc): 204 mg/dL (calc) — ABNORMAL HIGH (ref <130)
Total CHOL/HDL Ratio: 7 (calc) — ABNORMAL HIGH (ref <5.0)
Triglycerides: 227 mg/dL — ABNORMAL HIGH (ref <150)

## 2018-10-10 LAB — HEMOGLOBIN A1C
Hgb A1c MFr Bld: 5.9 % of total Hgb — ABNORMAL HIGH (ref <5.7)
Mean Plasma Glucose: 123 (calc)
eAG (mmol/L): 6.8 (calc)

## 2018-10-10 LAB — TSH: TSH: 3.83 mIU/L (ref 0.40–4.50)

## 2018-10-23 DIAGNOSIS — H353113 Nonexudative age-related macular degeneration, right eye, advanced atrophic without subfoveal involvement: Secondary | ICD-10-CM | POA: Diagnosis not present

## 2018-10-23 DIAGNOSIS — H353222 Exudative age-related macular degeneration, left eye, with inactive choroidal neovascularization: Secondary | ICD-10-CM | POA: Diagnosis not present

## 2018-10-23 DIAGNOSIS — H353212 Exudative age-related macular degeneration, right eye, with inactive choroidal neovascularization: Secondary | ICD-10-CM | POA: Diagnosis not present

## 2018-10-23 DIAGNOSIS — H353124 Nonexudative age-related macular degeneration, left eye, advanced atrophic with subfoveal involvement: Secondary | ICD-10-CM | POA: Diagnosis not present

## 2018-11-05 ENCOUNTER — Other Ambulatory Visit: Payer: Self-pay | Admitting: *Deleted

## 2018-11-05 DIAGNOSIS — F419 Anxiety disorder, unspecified: Secondary | ICD-10-CM

## 2018-11-05 MED ORDER — LORAZEPAM 0.5 MG PO TABS: 0.5000 mg | ORAL_TABLET | Freq: Every day | ORAL | 0 refills | Status: DC

## 2018-11-26 ENCOUNTER — Other Ambulatory Visit: Payer: Self-pay | Admitting: Adult Health

## 2018-11-26 ENCOUNTER — Other Ambulatory Visit: Payer: Self-pay | Admitting: Nurse Practitioner

## 2018-11-27 DIAGNOSIS — N183 Chronic kidney disease, stage 3 (moderate): Secondary | ICD-10-CM | POA: Diagnosis not present

## 2018-11-27 LAB — BASIC METABOLIC PANEL
BUN: 32 — AB (ref 4–21)
Creatinine: 1.6 — AB (ref 0.5–1.1)
GLUCOSE: 108
Potassium: 4.3 (ref 3.4–5.3)
Sodium: 141 (ref 137–147)

## 2018-11-27 LAB — CBC AND DIFFERENTIAL
HCT: 34 — AB (ref 36–46)
HEMOGLOBIN: 11.8 — AB (ref 12.0–16.0)
Platelets: 203 (ref 150–399)
WBC: 6.1

## 2018-12-01 ENCOUNTER — Other Ambulatory Visit: Payer: Self-pay | Admitting: *Deleted

## 2018-12-01 ENCOUNTER — Telehealth: Payer: Self-pay | Admitting: *Deleted

## 2018-12-01 ENCOUNTER — Encounter: Payer: Self-pay | Admitting: Nurse Practitioner

## 2018-12-01 DIAGNOSIS — I10 Essential (primary) hypertension: Secondary | ICD-10-CM

## 2018-12-01 DIAGNOSIS — E559 Vitamin D deficiency, unspecified: Secondary | ICD-10-CM | POA: Insufficient documentation

## 2018-12-01 MED ORDER — LABETALOL HCL 100 MG PO TABS: 100.0000 mg | ORAL_TABLET | Freq: Two times a day (BID) | ORAL | 0 refills | Status: DC

## 2018-12-01 NOTE — Telephone Encounter (Signed)
Mast, Man X, NP  Eilene Ghazi, RMA        Please inform Jacqueline Dodson her lab results 11/27/17 showed persisted chronic kidney disease remains no change, creat 1.57 at her baseline. Vit D 27, she is recommended to take Vit D 1000 u daily. normal range is 30-100, The rest of the test results are unremarkable.    Spoke with patient regarding her lab results, she stated that she would pick up some Vitamin D (1000 units) from the pharmacy. She also she stated that understood the results and had no questions at this time.

## 2018-12-04 DIAGNOSIS — D631 Anemia in chronic kidney disease: Secondary | ICD-10-CM | POA: Diagnosis not present

## 2018-12-04 DIAGNOSIS — N183 Chronic kidney disease, stage 3 (moderate): Secondary | ICD-10-CM | POA: Diagnosis not present

## 2018-12-04 DIAGNOSIS — I129 Hypertensive chronic kidney disease with stage 1 through stage 4 chronic kidney disease, or unspecified chronic kidney disease: Secondary | ICD-10-CM | POA: Diagnosis not present

## 2018-12-04 DIAGNOSIS — N2581 Secondary hyperparathyroidism of renal origin: Secondary | ICD-10-CM | POA: Diagnosis not present

## 2018-12-05 ENCOUNTER — Other Ambulatory Visit: Payer: Self-pay | Admitting: *Deleted

## 2018-12-05 DIAGNOSIS — F419 Anxiety disorder, unspecified: Secondary | ICD-10-CM

## 2018-12-05 MED ORDER — LORAZEPAM 0.5 MG PO TABS: 0.5000 mg | ORAL_TABLET | Freq: Every day | ORAL | 0 refills | Status: DC

## 2018-12-17 ENCOUNTER — Telehealth: Payer: Self-pay

## 2018-12-17 NOTE — Telephone Encounter (Signed)
Patient wanted to be seen in clinic tomorrow. Clinic is booked. Discussed and routing to [facility] to keep patient on list in case any spots become available.

## 2018-12-19 NOTE — Telephone Encounter (Signed)
Called patient regarding a appointment to see [facility], she stated that she was having diarrhea and vomiting on Sunday . It has stopped for now and we scheduled a appointment for Thursday 19th, 2020.

## 2018-12-25 ENCOUNTER — Encounter: Payer: Self-pay | Admitting: Nurse Practitioner

## 2018-12-25 ENCOUNTER — Other Ambulatory Visit: Payer: Self-pay

## 2018-12-25 ENCOUNTER — Other Ambulatory Visit: Payer: Self-pay | Admitting: *Deleted

## 2018-12-25 ENCOUNTER — Non-Acute Institutional Stay: Payer: Medicare Other | Admitting: Nurse Practitioner

## 2018-12-25 DIAGNOSIS — G609 Hereditary and idiopathic neuropathy, unspecified: Secondary | ICD-10-CM

## 2018-12-25 DIAGNOSIS — F411 Generalized anxiety disorder: Secondary | ICD-10-CM

## 2018-12-25 DIAGNOSIS — I130 Hypertensive heart and chronic kidney disease with heart failure and stage 1 through stage 4 chronic kidney disease, or unspecified chronic kidney disease: Secondary | ICD-10-CM

## 2018-12-25 DIAGNOSIS — E1122 Type 2 diabetes mellitus with diabetic chronic kidney disease: Secondary | ICD-10-CM | POA: Diagnosis not present

## 2018-12-25 DIAGNOSIS — K219 Gastro-esophageal reflux disease without esophagitis: Secondary | ICD-10-CM

## 2018-12-25 DIAGNOSIS — N183 Chronic kidney disease, stage 3 (moderate): Secondary | ICD-10-CM | POA: Diagnosis not present

## 2018-12-25 DIAGNOSIS — R197 Diarrhea, unspecified: Secondary | ICD-10-CM

## 2018-12-25 LAB — RENAL FUNCTION PANEL
Albumin: 4
CHLORIDE: 108
CO2: 24
Calcium: 9.4
EGFR (Non-African Amer.): 29
Magnesium: 2.1
PTH: 60
Phosphorus: 4.2
Vitamin D, 25-OH, Total: 27

## 2018-12-25 NOTE — Assessment & Plan Note (Signed)
Peripheral neuropathic pain in BLE, stable, continue  Gabapentin 100mg  bid.

## 2018-12-25 NOTE — Assessment & Plan Note (Signed)
Stable, continue Omeprazole 20mg  qd, prn Pepto Bismol.

## 2018-12-25 NOTE — Assessment & Plan Note (Signed)
Anxiety, manageable, continue prn Lorazepam 0.5mg  hs is adequate to help rest/sleep at night.

## 2018-12-25 NOTE — Assessment & Plan Note (Signed)
Diet controlled blood sugar, continue life style modification.

## 2018-12-25 NOTE — Assessment & Plan Note (Signed)
About 10 days, dc Metamucil, continue Probiotics. Will obtain CBC/diff, CMP/eGFR, C-diff Toxin A/B. Pepto Bismol 82m bid 5 days, then prn.

## 2018-12-25 NOTE — Assessment & Plan Note (Signed)
Last creat 1.47 11/27/18, continue Micardis 80mg  qd, Nifedipine 60mg  qd, Labetalol 100mg  bid for blood pressure control.

## 2018-12-25 NOTE — Progress Notes (Signed)
[facility]   Place of Service:  Clinic (12) Provider: Marlana Latus NP  Code Status: DNR Goals of Care: [place] 09/26/2017  Does Patient Have a Medical Advance Directive? Yes  Type of Paramedic of [place];Living will  Does patient want to make changes to medical advance directive? -  Copy of [place] in Chart? Yes  Would patient like information on creating a medical advance directive? -     Chief Complaint  Patient presents with  . Acute Visit    C/o - queasiness, little nausea, unsteady    HPI: Patient is a 85 y.o. female seen today for nausea, vomiting, abd cramp, generalized weakness, chills diarrhea. Sudden onset, lasted about 10 days, it seems calming down, but not completely resolved. Her appetite is at her baseline. Taking Probiotic, Metamucil.   The patient has history of T2DM, blood sugar is controlled on diet/exercise, last Hgb a1c 5.9 10/07/18. HTN, blood pressure is controlled on Micardis '80mg'$  qd, Nifedipine '60mg'$  qd, Labetalol '100mg'$  bid. GERD stable on Omeprazole '20mg'$  qd.  Anxiety, prn Lorazepam 0.'5mg'$  hs is adequate to help rest/sleep at night. Peripheral neuropathic pain in BLE, stable on Gabapentin '100mg'$  bid.    Past Medical History:  Diagnosis Date  . Abnormality of gait   . Acute, but ill-defined, cerebrovascular disease   . Anxiety   . Arthritis   . Chronic kidney disease, stage II (mild)   . Cystocele, midline   . Dermatophytosis of the body   . Disturbance of skin sensation   . Dizziness and giddiness   . Duodenal ulcer, unspecified as acute or chronic, without hemorrhage, perforation, or obstruction   . Edema   . Elevated blood pressure reading without diagnosis of hypertension   . GERD (gastroesophageal reflux disease)   . Hemorrhoids   . Hypertension   . Left bundle branch hemiblock   . Lumbago   . Macular degeneration (senile) of retina, unspecified   . Myalgia and  myositis, unspecified   . Orthostatic hypotension   . Other abnormal blood chemistry   . Other malaise and fatigue   . Personal history of fall   . Polyneuropathy in other diseases classified elsewhere ([place]) 02/16/2014  . Postmenopausal atrophic vaginitis   . 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 Rectocele   . Restless legs syndrome (RLS)   . Unspecified constipation   . Unspecified hereditary and idiopathic peripheral neuropathy   . Urgency of urination     Past Surgical History:  Procedure Laterality Date  . ABDOMINAL HYSTERECTOMY  1972  . APPENDECTOMY  1945  . CATARACT EXTRACTION Bilateral   . EXTERNAL EAR SURGERY  2010   left outer ear basel cell removed  . TONSILLECTOMY  1940    Allergies  Allergen Reactions  . Amlodipine Other (See Comments)    Reaction:  Critically decreased sodium and potassium levels.    . Enalapril Other (See Comments)    Reaction:  Unknown   . Hctz [Hydrochlorothiazide] Other (See Comments)    Reaction:  Unknown   . Penicillins Other (See Comments)    Reaction:  Unknown   . Amoxicillin Swelling and Rash    Allergies as of 12/25/2018      Reactions   Amlodipine Other (See Comments)   Reaction:  Critically decreased sodium and potassium levels.     Enalapril Other (See Comments)   Reaction:  Unknown    Hctz [hydrochlorothiazide] Other (See Comments)  Reaction:  Unknown    Penicillins Other (See Comments)   Reaction:  Unknown    Amoxicillin Swelling, Rash      Medication List       Accurate as of December 25, 2018 11:59 PM. Always use your most recent med list.        acetaminophen 500 MG tablet Commonly known as:  TYLENOL Take 500 mg by mouth as needed.   calcium carbonate 500 MG chewable tablet Commonly known as:  TUMS - dosed in mg elemental calcium Chew 1 tablet by mouth as needed for indigestion or heartburn.   cholecalciferol 25 MCG (1000 UT) tablet Commonly known as:  VITAMIN D Take 1,000 Units by mouth daily.    gabapentin 100 MG capsule Commonly known as:  NEURONTIN TAKE 1 CAPSULE TWICE DAILY   labetalol 100 MG tablet Commonly known as:  NORMODYNE Take 1 tablet (100 mg total) by mouth 2 (two) times daily.   LORazepam 0.5 MG tablet Commonly known as:  ATIVAN Take 1 tablet (0.5 mg total) by mouth at bedtime.   multivitamin with minerals tablet Take 1 tablet by mouth daily.   ICAPS Tabs Take 1 tablet by mouth daily.   NIFEdipine 60 MG 24 hr tablet Commonly known as:  PROCARDIA XL/NIFEDICAL XL TAKE 1 TABLET DAILY FOR    BLOOD PRESSURE   omeprazole 20 MG tablet Commonly known as:  PRILOSEC OTC Take 20 mg by mouth every other day.   PROBIOTIC ADVANCED PO Take by mouth.   psyllium 58.6 % powder Commonly known as:  METAMUCIL Take 1 packet by mouth every other day.   telmisartan 80 MG tablet Commonly known as:  MICARDIS Take one tablet by mouth once daily for blood pressure       Review of Systems:  Review of Systems  Constitutional: Positive for fatigue. Negative for activity change, appetite change, chills, diaphoresis, fever and unexpected weight change.       Chills  HENT: Positive for hearing loss. Negative for voice change.   Eyes: Negative for visual disturbance.  Respiratory: Negative for cough and shortness of breath.   Cardiovascular: Negative for chest pain, palpitations and leg swelling.  Gastrointestinal: Positive for abdominal pain, diarrhea, nausea and vomiting. Negative for abdominal distention, blood in stool and constipation.  Genitourinary: Negative for difficulty urinating, dysuria and urgency.       Incontinent of urine.   Musculoskeletal: Positive for arthralgias and gait problem.  Skin: Negative for pallor.  Neurological: Negative for dizziness, facial asymmetry, speech difficulty, weakness, light-headedness, numbness and headaches.       Restless legs, neuropathic pain BLE sometimes.   Psychiatric/Behavioral: Negative for agitation, behavioral problems  and hallucinations. The patient is nervous/anxious.     Health Maintenance  Topic Date Due  . FOOT EXAM  05/05/1937  . OPHTHALMOLOGY EXAM  [birth year]05/1937  . DEXA SCAN  05/04/1994  . HEMOGLOBIN A1C  04/07/2019  . TETANUS/TDAP  09/04/2027  . INFLUENZA VACCINE  Completed  . PNA vac Low Risk Adult  Completed    Physical Exam: Vitals:   12/25/18 1438  BP: 120/62  Pulse: 73  Resp: 18  Temp: 98.8 F (37.1 C)  TempSrc: Oral  SpO2: 97%  Weight: 143 lb 6.4 oz (65 kg)  Height: '5\' 6"'$  (1.676 m)   Body mass index is 23.15 kg/m. Physical Exam Constitutional:      General: She is not in acute distress.    Appearance: Normal appearance. She is not ill-appearing, toxic-appearing or diaphoretic.  HENT:     Head: Normocephalic and atraumatic.     Nose: Nose normal.     Mouth/Throat:     Mouth: Mucous membranes are moist.  Eyes:     Extraocular Movements: Extraocular movements intact.     Pupils: Pupils are equal, round, and reactive to light.  Neck:     Musculoskeletal: Normal range of motion and neck supple.  Cardiovascular:     Rate and Rhythm: Normal rate and regular rhythm.     Heart sounds: Murmur present.  Pulmonary:     Effort: Pulmonary effort is normal.     Breath sounds: No wheezing, rhonchi or rales.  Abdominal:     General: There is no distension.     Palpations: Abdomen is soft.     Tenderness: There is no abdominal tenderness. There is no guarding or rebound.  Musculoskeletal:        General: No deformity.     Right lower leg: No edema.     Left lower leg: No edema.     Comments: Ambulates with walker.   Skin:    General: Skin is warm and dry.  Neurological:     General: No focal deficit present.     Mental Status: She is alert. Mental status is at baseline.     Cranial Nerves: No cranial nerve deficit.     Motor: No weakness.     Coordination: Coordination normal.     Gait: Gait abnormal.     Comments: Oriented to person and place.   Psychiatric:         Mood and Affect: Mood normal.        Behavior: Behavior normal.        Thought Content: Thought content normal.     Labs reviewed: Basic Metabolic Panel: Recent Labs    01/16/18 10/07/18 0000 11/27/18  NA 138 140 141  K 4.4 4.1 4.3  CL  --  106 108  CO2  --  24 24  GLUCOSE  --  108*  --   BUN 26* 29* 32*  CREATININE 1.6* 1.64* 1.6*  CALCIUM  --  9.3 9.4  MG  --   --  2.1  PHOS  --   --  4.2  TSH  --  3.83  --    Liver Function Tests: Recent Labs    10/07/18 0000 11/27/18  AST 21  --   ALT 18  --   BILITOT 0.3  --   PROT 6.7  --   ALBUMIN  --  4.0   No results for input(s): LIPASE, AMYLASE in the last 8760 hours. No results for input(s): AMMONIA in the last 8760 hours. CBC: Recent Labs    10/07/18 0000 11/27/18  WBC 6.2 6.1  NEUTROABS 2,623  --   HGB 11.5* 11.8*  HCT 33.6* 34*  MCV 88.4  --   PLT 229 203   Lipid Panel: Recent Labs    10/07/18 0000  CHOL 238*  HDL 34*  LDLCALC 166*  TRIG 227*  CHOLHDL 7.0*   Lab Results  Component Value Date   HGBA1C 5.9 (H) 10/07/2018    Procedures since last visit: No results found.  Assessment/Plan  Benign hypertensive heart and CKD, stage 3 (GFR 30-59), w CHF ([place]) Last creat 1.47 11/27/18, continue Micardis '80mg'$  qd, Nifedipine '60mg'$  qd, Labetalol '100mg'$  bid for blood pressure control.   GERD (gastroesophageal reflux disease) Stable, continue Omeprazole '20mg'$  qd, prn Pepto Bismol.  Peripheral neuropathy Peripheral  neuropathic pain in BLE, stable, continue  Gabapentin '100mg'$  bid.   Generalized anxiety disorder Anxiety, manageable, continue prn Lorazepam 0.'5mg'$  hs is adequate to help rest/sleep at night.  Type 2 diabetes mellitus with diabetic chronic kidney disease ([place]) Diet controlled blood sugar, continue life style modification.   Diarrhea About 10 days, dc Metamucil, continue Probiotics. Will obtain CBC/diff, CMP/eGFR, C-diff Toxin A/B. Pepto Bismol 3m bid 5 days, then prn.    Labs/tests ordered:  none  Next appt:  04/02/2019

## 2018-12-25 NOTE — Patient Instructions (Signed)
Stop taking Metamucil, will update CBC/diff, CMP/eGFR, C-diff Toxin A/B

## 2018-12-29 ENCOUNTER — Other Ambulatory Visit: Payer: Self-pay

## 2018-12-29 ENCOUNTER — Other Ambulatory Visit: Payer: Self-pay | Admitting: *Deleted

## 2018-12-29 DIAGNOSIS — N183 Chronic kidney disease, stage 3 unspecified: Secondary | ICD-10-CM

## 2018-12-29 DIAGNOSIS — I10 Essential (primary) hypertension: Secondary | ICD-10-CM

## 2018-12-29 DIAGNOSIS — K219 Gastro-esophageal reflux disease without esophagitis: Secondary | ICD-10-CM | POA: Diagnosis not present

## 2018-12-30 ENCOUNTER — Other Ambulatory Visit: Payer: Medicare Other

## 2018-12-30 ENCOUNTER — Other Ambulatory Visit: Payer: Self-pay

## 2018-12-30 DIAGNOSIS — I130 Hypertensive heart and chronic kidney disease with heart failure and stage 1 through stage 4 chronic kidney disease, or unspecified chronic kidney disease: Secondary | ICD-10-CM

## 2018-12-30 DIAGNOSIS — K219 Gastro-esophageal reflux disease without esophagitis: Secondary | ICD-10-CM

## 2018-12-30 DIAGNOSIS — N183 Chronic kidney disease, stage 3 unspecified: Secondary | ICD-10-CM

## 2018-12-30 LAB — COMPLETE METABOLIC PANEL WITH GFR
AG Ratio: 1.4 (calc) (ref 1.0–2.5)
ALKALINE PHOSPHATASE (APISO): 67 U/L (ref 37–153)
ALT: 14 U/L (ref 6–29)
AST: 16 U/L (ref 10–35)
Albumin: 3.8 g/dL (ref 3.6–5.1)
BUN / CREAT RATIO: 19 (calc) (ref 6–22)
BUN: 34 mg/dL — ABNORMAL HIGH (ref 7–25)
CHLORIDE: 106 mmol/L (ref 98–110)
CO2: 27 mmol/L (ref 20–32)
Calcium: 9.2 mg/dL (ref 8.6–10.4)
Creat: 1.77 mg/dL — ABNORMAL HIGH (ref 0.60–0.88)
GFR, Est African American: 29 mL/min/{1.73_m2} — ABNORMAL LOW (ref >=60)
GFR, Est Non African American: 25 mL/min/{1.73_m2} — ABNORMAL LOW (ref >=60)
Globulin: 2.7 g/dL (calc) (ref 1.9–3.7)
Glucose, Bld: 103 mg/dL — ABNORMAL HIGH (ref 65–99)
Potassium: 4.5 mmol/L (ref 3.5–5.3)
Sodium: 142 mmol/L (ref 135–146)
Total Bilirubin: 0.3 mg/dL (ref 0.2–1.2)
Total Protein: 6.5 g/dL (ref 6.1–8.1)

## 2018-12-30 LAB — CBC WITH DIFFERENTIAL/PLATELET
Absolute Monocytes: 596 cells/uL (ref 200–950)
BASOS ABS: 53 {cells}/uL (ref 0–200)
Basophils Relative: 0.9 %
Eosinophils Absolute: 230 cells/uL (ref 15–500)
Eosinophils Relative: 3.9 %
HCT: 33.6 % — ABNORMAL LOW (ref 35.0–45.0)
Hemoglobin: 11.3 g/dL — ABNORMAL LOW (ref 11.7–15.5)
Lymphs Abs: 2272 cells/uL (ref 850–3900)
MCH: 30.1 pg (ref 27.0–33.0)
MCHC: 33.6 g/dL (ref 32.0–36.0)
MCV: 89.4 fL (ref 80.0–100.0)
MPV: 9.3 fL (ref 7.5–12.5)
Monocytes Relative: 10.1 %
NEUTROS PCT: 46.6 %
Neutro Abs: 2749 cells/uL (ref 1500–7800)
Platelets: 245 10*3/uL (ref 140–400)
RBC: 3.76 10*6/uL — ABNORMAL LOW (ref 3.80–5.10)
RDW: 12 % (ref 11.0–15.0)
Total Lymphocyte: 38.5 %
WBC: 5.9 10*3/uL (ref 3.8–10.8)

## 2019-01-06 ENCOUNTER — Other Ambulatory Visit: Payer: Self-pay | Admitting: *Deleted

## 2019-01-06 DIAGNOSIS — F419 Anxiety disorder, unspecified: Secondary | ICD-10-CM

## 2019-01-06 MED ORDER — LORAZEPAM 0.5 MG PO TABS: 0.5000 mg | ORAL_TABLET | Freq: Every day | ORAL | 0 refills | Status: DC

## 2019-01-06 NOTE — Telephone Encounter (Signed)
Patient requested refill [place] Verified LR: 12/05/2018 Phoned Rx to pharmacy.

## 2019-01-07 DIAGNOSIS — R2681 Unsteadiness on feet: Secondary | ICD-10-CM | POA: Diagnosis not present

## 2019-01-07 DIAGNOSIS — N393 Stress incontinence (female) (male): Secondary | ICD-10-CM | POA: Diagnosis not present

## 2019-01-07 DIAGNOSIS — M6281 Muscle weakness (generalized): Secondary | ICD-10-CM | POA: Diagnosis not present

## 2019-01-08 DIAGNOSIS — N393 Stress incontinence (female) (male): Secondary | ICD-10-CM | POA: Diagnosis not present

## 2019-01-08 DIAGNOSIS — M6281 Muscle weakness (generalized): Secondary | ICD-10-CM | POA: Diagnosis not present

## 2019-01-08 DIAGNOSIS — R2681 Unsteadiness on feet: Secondary | ICD-10-CM | POA: Diagnosis not present

## 2019-01-12 ENCOUNTER — Other Ambulatory Visit: Payer: Self-pay | Admitting: *Deleted

## 2019-01-12 DIAGNOSIS — R2681 Unsteadiness on feet: Secondary | ICD-10-CM | POA: Diagnosis not present

## 2019-01-12 DIAGNOSIS — I130 Hypertensive heart and chronic kidney disease with heart failure and stage 1 through stage 4 chronic kidney disease, or unspecified chronic kidney disease: Secondary | ICD-10-CM

## 2019-01-12 DIAGNOSIS — N393 Stress incontinence (female) (male): Secondary | ICD-10-CM | POA: Diagnosis not present

## 2019-01-12 DIAGNOSIS — N183 Chronic kidney disease, stage 3 (moderate): Principal | ICD-10-CM

## 2019-01-12 DIAGNOSIS — M6281 Muscle weakness (generalized): Secondary | ICD-10-CM | POA: Diagnosis not present

## 2019-01-14 DIAGNOSIS — I130 Hypertensive heart and chronic kidney disease with heart failure and stage 1 through stage 4 chronic kidney disease, or unspecified chronic kidney disease: Secondary | ICD-10-CM | POA: Diagnosis not present

## 2019-01-14 DIAGNOSIS — N183 Chronic kidney disease, stage 3 (moderate): Secondary | ICD-10-CM | POA: Diagnosis not present

## 2019-01-15 ENCOUNTER — Other Ambulatory Visit: Payer: Self-pay

## 2019-01-15 ENCOUNTER — Other Ambulatory Visit: Payer: Medicare Other

## 2019-01-15 DIAGNOSIS — N183 Chronic kidney disease, stage 3 unspecified: Secondary | ICD-10-CM

## 2019-01-15 DIAGNOSIS — I130 Hypertensive heart and chronic kidney disease with heart failure and stage 1 through stage 4 chronic kidney disease, or unspecified chronic kidney disease: Secondary | ICD-10-CM

## 2019-01-15 LAB — COMPLETE METABOLIC PANEL WITH GFR
AG Ratio: 1.6 (calc) (ref 1.0–2.5)
ALT: 15 U/L (ref 6–29)
AST: 18 U/L (ref 10–35)
Albumin: 4 g/dL (ref 3.6–5.1)
Alkaline phosphatase (APISO): 69 U/L (ref 37–153)
BUN/Creatinine Ratio: 19 (calc) (ref 6–22)
BUN: 29 mg/dL — ABNORMAL HIGH (ref 7–25)
CO2: 25 mmol/L (ref 20–32)
Calcium: 9.2 mg/dL (ref 8.6–10.4)
Chloride: 105 mmol/L (ref 98–110)
Creat: 1.5 mg/dL — ABNORMAL HIGH (ref 0.60–0.88)
GFR, Est African American: 35 mL/min/{1.73_m2} — ABNORMAL LOW (ref >=60)
GFR, Est Non African American: 31 mL/min/{1.73_m2} — ABNORMAL LOW (ref >=60)
Globulin: 2.5 g/dL (calc) (ref 1.9–3.7)
Glucose, Bld: 105 mg/dL — ABNORMAL HIGH (ref 65–99)
Potassium: 4.3 mmol/L (ref 3.5–5.3)
Sodium: 139 mmol/L (ref 135–146)
Total Bilirubin: 0.3 mg/dL (ref 0.2–1.2)
Total Protein: 6.5 g/dL (ref 6.1–8.1)

## 2019-01-19 ENCOUNTER — Other Ambulatory Visit: Payer: Self-pay | Admitting: Nurse Practitioner

## 2019-01-19 DIAGNOSIS — R2681 Unsteadiness on feet: Secondary | ICD-10-CM | POA: Diagnosis not present

## 2019-01-19 DIAGNOSIS — N393 Stress incontinence (female) (male): Secondary | ICD-10-CM | POA: Diagnosis not present

## 2019-01-19 DIAGNOSIS — M6281 Muscle weakness (generalized): Secondary | ICD-10-CM | POA: Diagnosis not present

## 2019-01-20 DIAGNOSIS — M6281 Muscle weakness (generalized): Secondary | ICD-10-CM | POA: Diagnosis not present

## 2019-01-20 DIAGNOSIS — R2681 Unsteadiness on feet: Secondary | ICD-10-CM | POA: Diagnosis not present

## 2019-01-20 DIAGNOSIS — N393 Stress incontinence (female) (male): Secondary | ICD-10-CM | POA: Diagnosis not present

## 2019-01-22 DIAGNOSIS — R2681 Unsteadiness on feet: Secondary | ICD-10-CM | POA: Diagnosis not present

## 2019-01-22 DIAGNOSIS — M6281 Muscle weakness (generalized): Secondary | ICD-10-CM | POA: Diagnosis not present

## 2019-01-22 DIAGNOSIS — N393 Stress incontinence (female) (male): Secondary | ICD-10-CM | POA: Diagnosis not present

## 2019-01-26 DIAGNOSIS — N393 Stress incontinence (female) (male): Secondary | ICD-10-CM | POA: Diagnosis not present

## 2019-01-26 DIAGNOSIS — M6281 Muscle weakness (generalized): Secondary | ICD-10-CM | POA: Diagnosis not present

## 2019-01-26 DIAGNOSIS — R2681 Unsteadiness on feet: Secondary | ICD-10-CM | POA: Diagnosis not present

## 2019-01-29 DIAGNOSIS — M6281 Muscle weakness (generalized): Secondary | ICD-10-CM | POA: Diagnosis not present

## 2019-01-29 DIAGNOSIS — R2681 Unsteadiness on feet: Secondary | ICD-10-CM | POA: Diagnosis not present

## 2019-01-29 DIAGNOSIS — N393 Stress incontinence (female) (male): Secondary | ICD-10-CM | POA: Diagnosis not present

## 2019-02-02 DIAGNOSIS — N393 Stress incontinence (female) (male): Secondary | ICD-10-CM | POA: Diagnosis not present

## 2019-02-02 DIAGNOSIS — M6281 Muscle weakness (generalized): Secondary | ICD-10-CM | POA: Diagnosis not present

## 2019-02-02 DIAGNOSIS — R2681 Unsteadiness on feet: Secondary | ICD-10-CM | POA: Diagnosis not present

## 2019-02-03 ENCOUNTER — Other Ambulatory Visit: Payer: Self-pay | Admitting: *Deleted

## 2019-02-03 DIAGNOSIS — F419 Anxiety disorder, unspecified: Secondary | ICD-10-CM

## 2019-02-03 DIAGNOSIS — N393 Stress incontinence (female) (male): Secondary | ICD-10-CM | POA: Diagnosis not present

## 2019-02-03 DIAGNOSIS — M6281 Muscle weakness (generalized): Secondary | ICD-10-CM | POA: Diagnosis not present

## 2019-02-03 DIAGNOSIS — R2681 Unsteadiness on feet: Secondary | ICD-10-CM | POA: Diagnosis not present

## 2019-02-03 MED ORDER — LORAZEPAM 0.5 MG PO TABS: 0.5000 mg | ORAL_TABLET | Freq: Every day | ORAL | 0 refills | Status: DC

## 2019-02-03 NOTE — Telephone Encounter (Signed)
Patient requested refill [place] Database Verified LR: 01/06/2019 Pended Rx and sent to [facility] for approval.

## 2019-02-05 DIAGNOSIS — R2681 Unsteadiness on feet: Secondary | ICD-10-CM | POA: Diagnosis not present

## 2019-02-05 DIAGNOSIS — M6281 Muscle weakness (generalized): Secondary | ICD-10-CM | POA: Diagnosis not present

## 2019-02-05 DIAGNOSIS — N393 Stress incontinence (female) (male): Secondary | ICD-10-CM | POA: Diagnosis not present

## 2019-02-09 DIAGNOSIS — N393 Stress incontinence (female) (male): Secondary | ICD-10-CM | POA: Diagnosis not present

## 2019-02-09 DIAGNOSIS — M6281 Muscle weakness (generalized): Secondary | ICD-10-CM | POA: Diagnosis not present

## 2019-02-09 DIAGNOSIS — R2681 Unsteadiness on feet: Secondary | ICD-10-CM | POA: Diagnosis not present

## 2019-02-10 DIAGNOSIS — N393 Stress incontinence (female) (male): Secondary | ICD-10-CM | POA: Diagnosis not present

## 2019-02-10 DIAGNOSIS — M6281 Muscle weakness (generalized): Secondary | ICD-10-CM | POA: Diagnosis not present

## 2019-02-10 DIAGNOSIS — R2681 Unsteadiness on feet: Secondary | ICD-10-CM | POA: Diagnosis not present

## 2019-02-12 DIAGNOSIS — N393 Stress incontinence (female) (male): Secondary | ICD-10-CM | POA: Diagnosis not present

## 2019-02-12 DIAGNOSIS — M6281 Muscle weakness (generalized): Secondary | ICD-10-CM | POA: Diagnosis not present

## 2019-02-12 DIAGNOSIS — R2681 Unsteadiness on feet: Secondary | ICD-10-CM | POA: Diagnosis not present

## 2019-03-02 ENCOUNTER — Other Ambulatory Visit: Payer: Self-pay | Admitting: Nurse Practitioner

## 2019-03-03 NOTE — Telephone Encounter (Signed)
Refill request  Received high medication alert for allergy contraindication   Routed to Man X Mast NP for refill approval.

## 2019-03-04 ENCOUNTER — Other Ambulatory Visit: Payer: Self-pay | Admitting: *Deleted

## 2019-03-04 DIAGNOSIS — F419 Anxiety disorder, unspecified: Secondary | ICD-10-CM

## 2019-03-04 MED ORDER — LORAZEPAM 0.5 MG PO TABS: 0.5000 mg | ORAL_TABLET | Freq: Every day | ORAL | 0 refills | Status: DC

## 2019-03-04 NOTE — Telephone Encounter (Signed)
Patient requested refill [place] Database Verified LR: 02/03/2019 Pended Rx and sent to [facility] for approval.

## 2019-03-11 ENCOUNTER — Other Ambulatory Visit: Payer: Self-pay | Admitting: Nurse Practitioner

## 2019-03-11 DIAGNOSIS — I10 Essential (primary) hypertension: Secondary | ICD-10-CM

## 2019-04-02 ENCOUNTER — Non-Acute Institutional Stay: Payer: Medicare Other | Admitting: Nurse Practitioner

## 2019-04-02 ENCOUNTER — Encounter: Payer: Self-pay | Admitting: Nurse Practitioner

## 2019-04-02 ENCOUNTER — Other Ambulatory Visit: Payer: Self-pay

## 2019-04-02 DIAGNOSIS — I130 Hypertensive heart and chronic kidney disease with heart failure and stage 1 through stage 4 chronic kidney disease, or unspecified chronic kidney disease: Secondary | ICD-10-CM

## 2019-04-02 DIAGNOSIS — N183 Chronic kidney disease, stage 3 unspecified: Secondary | ICD-10-CM

## 2019-04-02 DIAGNOSIS — E1122 Type 2 diabetes mellitus with diabetic chronic kidney disease: Secondary | ICD-10-CM | POA: Diagnosis not present

## 2019-04-02 DIAGNOSIS — G63 Polyneuropathy in diseases classified elsewhere: Secondary | ICD-10-CM | POA: Diagnosis not present

## 2019-04-02 DIAGNOSIS — G2581 Restless legs syndrome: Secondary | ICD-10-CM | POA: Diagnosis not present

## 2019-04-02 DIAGNOSIS — E785 Hyperlipidemia, unspecified: Secondary | ICD-10-CM

## 2019-04-02 DIAGNOSIS — I10 Essential (primary) hypertension: Secondary | ICD-10-CM | POA: Diagnosis not present

## 2019-04-02 DIAGNOSIS — E1169 Type 2 diabetes mellitus with other specified complication: Secondary | ICD-10-CM | POA: Diagnosis not present

## 2019-04-02 DIAGNOSIS — N8110 Cystocele, unspecified: Secondary | ICD-10-CM

## 2019-04-02 DIAGNOSIS — F411 Generalized anxiety disorder: Secondary | ICD-10-CM | POA: Diagnosis not present

## 2019-04-02 DIAGNOSIS — K219 Gastro-esophageal reflux disease without esophagitis: Secondary | ICD-10-CM | POA: Diagnosis not present

## 2019-04-02 DIAGNOSIS — F419 Anxiety disorder, unspecified: Secondary | ICD-10-CM

## 2019-04-02 MED ORDER — LORAZEPAM 0.5 MG PO TABS: 0.5000 mg | ORAL_TABLET | Freq: Every day | ORAL | 0 refills | Status: DC

## 2019-04-02 NOTE — Assessment & Plan Note (Signed)
Needs Urology consultation. Estrace cream prn.

## 2019-04-02 NOTE — Assessment & Plan Note (Signed)
Continue to manage blood sugar with diet and exercise, update Hgb a1c.

## 2019-04-02 NOTE — Assessment & Plan Note (Signed)
Stable, continue Gabapentin 100mg bid.  

## 2019-04-02 NOTE — Assessment & Plan Note (Signed)
Blood pressure is controlled, continue Telmisartan 80mg  qd, Nifedipine 60mg  qd, Labetalol 100mg  qd.

## 2019-04-02 NOTE — Assessment & Plan Note (Addendum)
Last creat 1.5, eGFR 31 01/2019, update CMP/eGFR, CBC/diff

## 2019-04-02 NOTE — Assessment & Plan Note (Signed)
10/16/18 declined statin. Last LDL 166 10/07/18. Update lipid panel. Continue diet and exercise.

## 2019-04-02 NOTE — Assessment & Plan Note (Signed)
Stable, continue Omeprazole 20mg qd.  

## 2019-04-02 NOTE — Progress Notes (Signed)
Location:   [facility]   Place of Service:    Provider: Marlana Latus NP  Code Status: DNR Goals of Care[place] 09[place]/2017  Does Patient Have a Medical Advance Directive? Yes  Type of Paramedic of [place];Living will  Does patient want to make changes to medical advance directive? -  Copy of [place] in Chart? Yes  Would patient like information on creating a medical advance directive? -     Chief Complaint  Patient presents with  . Medical Management of Chronic Issues    6 month follow up patient states incontinence and protruding bladder seems to be getting worse     HPI: Patient is a 85 y.o. female seen today for medical management of chronic diseases.    The patient has history of HTN, blood pressure is controlled on Telmisartan '80mg'$  qd, Nifedipine '60mg'$  qd, Labetalol '100mg'$  qd. Peripheral neuropathy/reseless legs is managed with Gabapentin '100mg'$  bid. Anxiety, stable on Lorazepam 0.'5mg'$  hs prn. GERD stable on Omeprazole '20mg'$  qd. CKD last creat 1.5 01/14/19, eGFR 31. Hyperlipidemia, LDL 166 10/07/18. T2DM, blood sugar is controlled on diet and exercise, last Hgb a1c 5.9 10/08/19  Past Medical History:  Diagnosis Date  . Abnormality of gait   . Acute, but ill-defined, cerebrovascular disease   . Anxiety   . Arthritis   . Chronic kidney disease, stage II (mild)   . Cystocele, midline   . Dermatophytosis of the body   . Disturbance of skin sensation   . Dizziness and giddiness   . Duodenal ulcer, unspecified as acute or chronic, without hemorrhage, perforation, or obstruction   . Edema   . Elevated blood pressure reading without diagnosis of hypertension   . GERD (gastroesophageal reflux disease)   . Hemorrhoids   . Hypertension   . Left bundle branch hemiblock   . Lumbago   . Macular degeneration (senile) of retina, unspecified   . Myalgia and myositis, unspecified   . Orthostatic hypotension   . Other  abnormal blood chemistry   . Other malaise and fatigue   . Personal history of fall   . Polyneuropathy in other diseases classified elsewhere ([place]) 02/16/2014  . Postmenopausal atrophic vaginitis   . Primary cerebellar degeneration ([place])   . Rectocele   . Restless legs syndrome (RLS)   . Unspecified constipation   . Unspecified hereditary and idiopathic peripheral neuropathy   . Urgency of urination     Past Surgical History:  Procedure Laterality Date  . ABDOMINAL HYSTERECTOMY  1972  . APPENDECTOMY  1945  . CATARACT EXTRACTION Bilateral   . EXTERNAL EAR SURGERY  2010   left outer ear basel cell removed  . TONSILLECTOMY  1940    Allergies  Allergen Reactions  . Amlodipine Other (See Comments)    Reaction:  Critically decreased sodium and potassium levels.    . Enalapril Other (See Comments)    Reaction:  Unknown   . Hctz [Hydrochlorothiazide] Other (See Comments)    Reaction:  Unknown   . Penicillins Other (See Comments)    Reaction:  Unknown   . Amoxicillin Swelling and Rash    Allergies as of 04/02/2019      Reactions   Amlodipine Other (See Comments)   Reaction:  Critically decreased sodium and potassium levels.     Enalapril Other (See Comments)   Reaction:  Unknown    Hctz [hydrochlorothiazide] Other (See Comments)   Reaction:  Unknown    Penicillins  Other (See Comments)   Reaction:  Unknown    Amoxicillin Swelling, Rash      Medication List       Accurate as of April 02, 2019 11:59 PM. If you have any questions, ask your nurse or doctor.        acetaminophen 500 MG tablet Commonly known as: TYLENOL Take 500 mg by mouth as needed.   calcium carbonate 500 MG chewable tablet Commonly known as: TUMS - dosed in mg elemental calcium Chew 1 tablet by mouth as needed for indigestion or heartburn.   cholecalciferol 25 MCG (1000 UT) tablet Commonly known as: VITAMIN D Take 1,000 Units by mouth daily.   estradiol 0.1 MG/GM vaginal cream Commonly known as:  ESTRACE VAGINAL Place 1 Applicatorful vaginally 3 (three) times a week for 30 days. Started by: Man X Mast, NP   gabapentin 100 MG capsule Commonly known as: NEURONTIN TAKE 1 CAPSULE TWICE DAILY   labetalol 100 MG tablet Commonly known as: NORMODYNE TAKE 1 TABLET TWICE A DAY   LORazepam 0.5 MG tablet Commonly known as: ATIVAN Take 1 tablet (0.5 mg total) by mouth at bedtime.   multivitamin with minerals tablet Take 1 tablet by mouth daily.   ICAPS Tabs Take 1 tablet by mouth daily.   NIFEdipine 60 MG 24 hr tablet Commonly known as: PROCARDIA XL/NIFEDICAL XL TAKE 1 TABLET DAILY FOR    BLOOD PRESSURE   omeprazole 20 MG tablet Commonly known as: PRILOSEC OTC Take 20 mg by mouth every other day.   PROBIOTIC ADVANCED PO Take by mouth.   psyllium 58.6 % powder Commonly known as: METAMUCIL Take 1 packet by mouth every other day.   telmisartan 80 MG tablet Commonly known as: MICARDIS TAKE 1 TABLET ONCE DAILY   FOR BLOOD PRESSURE       Review of Systems:  Review of Systems  Constitutional: Negative for activity change, appetite change, chills, diaphoresis, fatigue, fever and unexpected weight change.       Weight loss #2Ibs in the past 3 months.   HENT: Positive for hearing loss, postnasal drip and rhinorrhea. Negative for congestion and voice change.        Chronic running nose.   Respiratory: Negative for cough, shortness of breath and wheezing.   Cardiovascular: Negative for chest pain, palpitations and leg swelling.  Gastrointestinal: Negative for abdominal distention, abdominal pain, constipation, diarrhea, nausea and vomiting.  Genitourinary: Positive for flank pain. Negative for difficulty urinating, dysuria and urgency.       Incontinent of urine  Musculoskeletal: Positive for arthralgias and gait problem.  Skin: Negative for color change and pallor.  Neurological: Negative for dizziness, speech difficulty, weakness and headaches.       Restless  legs/neuropathic pain in BLE occasionally.   Psychiatric/Behavioral: Negative for agitation, behavioral problems, confusion, hallucinations and sleep disturbance. The patient is not nervous/anxious.     Health Maintenance  Topic Date Due  . FOOT EXAM  05/05/1937  . OPHTHALMOLOGY EXAM  05/05/1937  . DEXA SCAN  05/04/1994  . HEMOGLOBIN A1C  04/07/2019  . INFLUENZA VACCINE  05/09/2019  . TETANUS/TDAP  09/04/2027  . PNA vac Low Risk Adult  Completed    Physical Exam: Vitals:   04/02/19 1325  BP: 124/70  Pulse: 70  Temp: 98.6 F (37 C)  TempSrc: Oral  SpO2: 96%  Weight: 141 lb 3.2 oz (64 kg)  Height: '5\' 6"'$  (1.676 m)   Body mass index is 22.79 kg/m. Physical Exam Constitutional:  General: She is not in acute distress.    Appearance: Normal appearance. She is normal weight. She is not ill-appearing or diaphoretic.  HENT:     Head: Normocephalic and atraumatic.     Nose: Rhinorrhea present. No congestion.     Mouth/Throat:     Mouth: Mucous membranes are moist.  Eyes:     Extraocular Movements: Extraocular movements intact.     Conjunctiva/sclera: Conjunctivae normal.     Pupils: Pupils are equal, round, and reactive to light.  Neck:     Musculoskeletal: Normal range of motion and neck supple.  Cardiovascular:     Rate and Rhythm: Normal rate and regular rhythm.     Heart sounds: Murmur present.  Pulmonary:     Effort: Pulmonary effort is normal.     Breath sounds: No wheezing, rhonchi or rales.  Chest:     Chest wall: No tenderness.  Abdominal:     General: Bowel sounds are normal. There is no distension.     Palpations: Abdomen is soft.     Tenderness: There is no abdominal tenderness. There is no right CVA tenderness, left CVA tenderness, guarding or rebound.  Musculoskeletal:     Right lower leg: No edema.     Left lower leg: No edema.     Comments: Ambulates with walker.   Neurological:     General: No focal deficit present.     Mental Status: She is  alert and oriented to person, place, and time. Mental status is at baseline.     Cranial Nerves: No cranial nerve deficit.     Motor: No weakness.     Coordination: Coordination normal.     Gait: Gait abnormal.     Deep Tendon Reflexes: Reflexes normal.  Psychiatric:        Mood and Affect: Mood normal.        Behavior: Behavior normal.        Thought Content: Thought content normal.        Judgment: Judgment normal.     Labs reviewed: Basic Metabolic Panel: Recent Labs    10/07/18 0000 11/27/18 12/29/18 0000 01/14/19 0700  NA 140 141 142 139  K 4.1 4.3 4.5 4.3  CL 106 108 106 105  CO2 '24 24 27 25  '$ GLUCOSE 108*  --  103* 105*  BUN 29* 32* 34* 29*  CREATININE 1.64* 1.6* 1.77* 1.50*  CALCIUM 9.3 9.4 9.2 9.2  MG  --  2.1  --   --   PHOS  --  4.2  --   --   TSH 3.83  --   --   --    Liver Function Tests: Recent Labs    10/07/18 0000 11/27/18 12/29/18 0000 01/14/19 0700  AST 21  --  16 18  ALT 18  --  14 15  BILITOT 0.3  --  0.3 0.3  PROT 6.7  --  6.5 6.5  ALBUMIN  --  4.0  --   --    No results for input(s): LIPASE, AMYLASE in the last 8760 hours. No results for input(s): AMMONIA in the last 8760 hours. CBC: Recent Labs    10/07/18 0000 11/27/18 12/29/18 0000  WBC 6.2 6.1 5.9  NEUTROABS 2,623  --  2,749  HGB 11.5* 11.8* 11.3*  HCT 33.6* 34* 33.6*  MCV 88.4  --  89.4  PLT 229 203 245   Lipid Panel: Recent Labs    10/07/18 0000  CHOL 238*  HDL  34*  LDLCALC 166*  TRIG 227*  CHOLHDL 7.0*   Lab Results  Component Value Date   HGBA1C 5.9 (H) 10/07/2018    Procedures since last visit: No results found.  Assessment/Plan  HTN (hypertension) Blood pressure is controlled, continue Telmisartan '80mg'$  qd, Nifedipine '60mg'$  qd, Labetalol '100mg'$  qd.   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    Hyperlipidemia associated with type 2 diabetes mellitus ([place]) 10/16/18 declined statin. Last LDL 166 10/07/18. Update lipid panel. Continue diet and exercise.   Peripheral neuropathy Stable, continue Gabapentin '100mg'$  bid.   Generalized anxiety disorder Stable, continue prn nightly Lorazepam 0.'5mg'$   Restless legs syndrome (RLS) Stable, continue Gabapentin '100mg'$  bid.   Type 2 diabetes mellitus with diabetic chronic kidney disease ([place]) Continue to manage blood sugar with diet and exercise, update Hgb a1c.   Cystocele, unspecified ([place]) Needs Urology consultation. Estrace cream prn.    Labs/tests ordered: CBC/diff, CMP, Hgb a1c,  lipid panel prior to the next appointment.   Next appt:  F/u Dr. Lyndel Safe 4 months

## 2019-04-02 NOTE — Assessment & Plan Note (Signed)
Stable, continue prn nightly Lorazepam 0.5mg 

## 2019-04-02 NOTE — Patient Instructions (Addendum)
F/u Dr. Lyndel Safe in 4 months. Hgb a1c, CBC/diff, CMP, lipid panel prior to the next appointment.

## 2019-04-02 NOTE — Telephone Encounter (Signed)
Patient called for refill request.

## 2019-04-03 MED ORDER — ESTRADIOL 0.1 MG/GM VA CREA: 1.0000 | TOPICAL_CREAM | VAGINAL | 12 refills | Status: AC

## 2019-04-23 DIAGNOSIS — H353113 Nonexudative age-related macular degeneration, right eye, advanced atrophic without subfoveal involvement: Secondary | ICD-10-CM | POA: Diagnosis not present

## 2019-04-23 DIAGNOSIS — H353222 Exudative age-related macular degeneration, left eye, with inactive choroidal neovascularization: Secondary | ICD-10-CM | POA: Diagnosis not present

## 2019-04-23 DIAGNOSIS — H353124 Nonexudative age-related macular degeneration, left eye, advanced atrophic with subfoveal involvement: Secondary | ICD-10-CM | POA: Diagnosis not present

## 2019-04-23 DIAGNOSIS — H353212 Exudative age-related macular degeneration, right eye, with inactive choroidal neovascularization: Secondary | ICD-10-CM | POA: Diagnosis not present

## 2019-05-05 ENCOUNTER — Other Ambulatory Visit: Payer: Self-pay | Admitting: *Deleted

## 2019-05-05 DIAGNOSIS — F419 Anxiety disorder, unspecified: Secondary | ICD-10-CM

## 2019-05-05 NOTE — Telephone Encounter (Signed)
Patient requested refill.  Pended Rx and sent to [facility] for approval.  

## 2019-05-08 ENCOUNTER — Other Ambulatory Visit: Payer: Self-pay | Admitting: Nurse Practitioner

## 2019-05-08 DIAGNOSIS — F419 Anxiety disorder, unspecified: Secondary | ICD-10-CM

## 2019-05-08 MED ORDER — LORAZEPAM 0.5 MG PO TABS: 0.5000 mg | ORAL_TABLET | Freq: Every day | ORAL | 0 refills | Status: DC

## 2019-05-08 NOTE — Telephone Encounter (Signed)
Patient aware rx sent to pharmacy.  

## 2019-05-08 NOTE — Telephone Encounter (Signed)
Patient called upset that RX request was still not responded to after 2-3 days. Please approve if necessary

## 2019-05-08 NOTE — Telephone Encounter (Signed)
Patient called upset that Refill request was not responded to. RX was sent to [facility] on 05/05/2019 and is still awaiting a reply.  Medication is controlled and requires approval from Mast, Man X, NP   Notation made in open refill request from 05/05/2019

## 2019-05-21 ENCOUNTER — Encounter: Payer: Self-pay | Admitting: Nurse Practitioner

## 2019-05-21 ENCOUNTER — Other Ambulatory Visit: Payer: Self-pay

## 2019-05-21 ENCOUNTER — Non-Acute Institutional Stay: Payer: Medicare Other | Admitting: Nurse Practitioner

## 2019-05-21 ENCOUNTER — Encounter: Payer: Medicare Other | Admitting: Nurse Practitioner

## 2019-05-21 VITALS — BP 110/60 | HR 75 | Temp 98.2°F | Resp 18 | Ht 66.0 in | Wt 145.0 lb

## 2019-05-21 DIAGNOSIS — N8110 Cystocele, unspecified: Secondary | ICD-10-CM

## 2019-05-21 DIAGNOSIS — Z0000 Encounter for general adult medical examination without abnormal findings: Secondary | ICD-10-CM | POA: Diagnosis not present

## 2019-05-21 NOTE — Progress Notes (Addendum)
Subjective:   Jacqueline Dodson is a 85 y.o. female who presents for Medicare Annual (Subsequent) preventive examination at [facility] clinic   Cardiac Risk Factors include: advanced age (>76men, >61 women);hypertension     Objective:     Vitals: BP 110/60   Pulse 75   Temp 98.2 F (36.8 C)   Resp 18   Ht 5\' 6"  (1.676 m)   Wt 145 lb (65.8 kg)   SpO2 98%   BMI 23.40 kg/m   Body mass index is 23.4 kg/m.  Advanced Directives 09/26/2017 08/22/2017 02/07/2017 05/31/2016 06/30/2015 05/10/2015 12/12/2014  Does Patient Have a Medical Advance Directive? Yes Yes Yes Yes Yes Yes No  Type of Paramedic of [place];Living will [place];Living will [place];Living will [place];Living will [place];Living will [place];Living will -  Does patient want to make changes to medical advance directive? - No - Patient declined No - Patient declined No - Patient declined - - -  Copy of [place] in Chart? Yes Yes Yes Yes Yes - -  Would patient like information on creating a medical advance directive? - - - - - - No - patient declined information    Tobacco Social History   Tobacco Use  Smoking Status Never Smoker  Smokeless Tobacco Never Used     Counseling given: Not Answered   Clinical Intake:  Pre-visit preparation completed: Yes  Pain : 0-10 Pain Score: 4  Pain Type: Neuropathic pain Pain Location: Heel Pain Orientation: Right Pain Radiating Towards: localized. Pain Descriptors / Indicators: Burning, Squeezing Pain Onset: In the past 7 days(lasted about 6-7 hours) Pain Frequency: (OCCASIONALLY) Pain Relieving Factors: standing on the heel helped, f/u Neurology Effect of Pain on Daily Activities: needs walker for walking  Pain Relieving Factors: standing on the heel helped, f/u Neurology  Nutritional Status: BMI of 19-24  Normal  Nutritional Risks: None Diabetes: No  How often do you need to have someone help you when you read instructions, pamphlets, or other written materials from your doctor or pharmacy?: 1 - Never What is the last grade level you completed in school?: college.  Interpreter Needed?: No  Information entered by ::  Bretta Bang NP  Past Medical History:  Diagnosis Date  . Abnormality of gait   . Acute, but ill-defined, cerebrovascular disease   . Anxiety   . Arthritis   . Chronic kidney disease, stage II (mild)   . Cystocele, midline   . Dermatophytosis of the body   . Disturbance of skin sensation   . Dizziness and giddiness   . Duodenal ulcer, unspecified as acute or chronic, without hemorrhage, perforation, or obstruction   . Edema   . Elevated blood pressure reading without diagnosis of hypertension   . GERD (gastroesophageal reflux disease)   . Hemorrhoids   . Hypertension   . Left bundle branch hemiblock   . Lumbago   . Macular degeneration (senile) of retina, unspecified   . Myalgia and myositis, unspecified   . Orthostatic hypotension   . Other abnormal blood chemistry   . Other malaise and fatigue   . Personal history of fall   . Polyneuropathy in other diseases classified elsewhere ([place]) 02/16/2014  . Postmenopausal atrophic vaginitis   . 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 Rectocele   . Restless legs syndrome (RLS)   . Unspecified constipation   . Unspecified hereditary and idiopathic  peripheral neuropathy   . Urgency of urination    Past Surgical History:  Procedure Laterality Date  . ABDOMINAL HYSTERECTOMY  1972  . APPENDECTOMY  1945  . CATARACT EXTRACTION Bilateral   . EXTERNAL EAR SURGERY  2010   left outer ear basel cell removed  . TONSILLECTOMY  1940   Family History  Problem Relation Age of Onset  . Aortic stenosis Mother   . Heart failure Mother   . Thrombosis Father   . Coronary artery disease Father   . Ataxia Sister   . Ataxia Brother    . Heart disease Brother   . Cancer Brother        Prostate  . Stroke Brother    Social History   Socioeconomic History  . Marital status: Widowed    Spouse name: Not on file  . Number of children: 5  . Years of education: college  . Highest education level: Not on file  Occupational History  . Occupation: Retired    Comment: Control and instrumentation engineer  Social Needs  . Financial resource strain: Not hard at all  . Food insecurity    Worry: Never true    Inability: Never true  . Transportation needs    Medical: Patient refused    Non-medical: Patient refused  Tobacco Use  . Smoking status: Never Smoker  . Smokeless tobacco: Never Used  Substance and Sexual Activity  . Alcohol use: No    Alcohol/week: 0.0 standard drinks  . Drug use: No  . Sexual activity: Never  Lifestyle  . Physical activity    Days per week: 3 days    Minutes per session: 30 min  . Stress: Not at all  Relationships  . Social connections    Talks on phone: More than three times a week    Gets together: More than three times a week    Attends religious service: More than 4 times per year    Active member of club or organization: Yes    Attends meetings of clubs or organizations: More than 4 times per year    Relationship status: Widowed  Other Topics Concern  . Not on file  Social History Narrative   Patient is widowed, with 5 children.   Patient is right handed.   Patient has college education.   Patient does not drink caffeine.    Outpatient Encounter Medications as of 05/21/2019  Medication Sig  . acetaminophen (TYLENOL) 500 MG tablet Take 500 mg by mouth as needed.  . calcium carbonate (TUMS - DOSED IN MG ELEMENTAL CALCIUM) 500 MG chewable tablet Chew 1 tablet by mouth as needed for indigestion or heartburn.   . cholecalciferol (VITAMIN D) 25 MCG (1000 UT) tablet Take 1,000 Units by mouth daily.  Marland Kitchen gabapentin (NEURONTIN) 100 MG capsule TAKE 1 CAPSULE TWICE DAILY  . labetalol (NORMODYNE) 100 MG  tablet TAKE 1 TABLET TWICE A DAY  . LORazepam (ATIVAN) 0.5 MG tablet Take 1 tablet (0.5 mg total) by mouth at bedtime.  . Multiple Vitamins-Minerals (ICAPS) TABS Take 1 tablet by mouth daily.  . Multiple Vitamins-Minerals (MULTIVITAMIN WITH MINERALS) tablet Take 1 tablet by mouth daily.  Marland Kitchen NIFEdipine (PROCARDIA XL/NIFEDICAL XL) 60 MG 24 hr tablet TAKE 1 TABLET DAILY FOR    BLOOD PRESSURE  . omeprazole (PRILOSEC OTC) 20 MG tablet Take 20 mg by mouth every other day.   . Probiotic Product (PROBIOTIC ADVANCED PO) Take by mouth.  . telmisartan (MICARDIS) 80 MG tablet TAKE 1 TABLET ONCE  DAILY   FOR BLOOD PRESSURE  . [DISCONTINUED] psyllium (METAMUCIL) 58.6 % powder Take 1 packet by mouth every other day.    No facility-administered encounter medications on file as of 05/21/2019.     Activities of Daily Living In your present state of health, do you have any difficulty performing the following activities: 05/21/2019  Hearing? N  Vision? N  Difficulty concentrating or making decisions? N  Walking or climbing stairs? N  Comment using walker  Dressing or bathing? N  Doing errands, shopping? N  Preparing Food and eating ? N  Using the Toilet? Y  Comment incontinent of urine  In the past six months, have you accidently leaked urine? Y  Do you have problems with loss of bowel control? Y  aging your Medications? N  aging your Finances? N  Housekeeping or managing your Housekeeping? N  Some recent data might be hidden    Patient Care Team: ,  X, NP as PCP - General (Internal Medicine) Lavonna Monarch, MD as Consulting Physician (Dermatology) Clent Jacks, MD as Consulting Physician (Ophthalmology) Elmarie Shiley, MD as Consulting Physician (Nephrology) Zadie Rhine Clent Demark, MD as Consulting Physician (Ophthalmology) [place] ,  X, NP as Nurse Practitioner (Internal Medicine)    Assessment:   This is a routine wellness examination for Y[place].  Exercise Activities and  Dietary recommendations Current Exercise Habits: Home exercise routine, Type of exercise: walking, Time (Minutes): 20, Frequency (Times/Week): 7, Weekly Exercise (Minutes/Week): 140, Intensity: Mild, Exercise limited by: neurologic condition(s)(sometimes)  Goals    . DIET - INCREASE WATER INTAKE     During day before dinner    . Increase physical activity     Uses more NuStep machine when available       Fall Risk Fall Risk  04/02/2019 08/22/2018 09/26/2017 08/22/2017 08/28/2016  Falls in the past year? 0 0 No No No  Comment - Emmi Telephone Survey: data to providers prior to load - - -  Number falls in past yr: 0 - - - -  Injury with Fall? 0 - - - -  Risk for fall due to : - - Impaired balance/gait;Impaired vision - -   Is the patient's home free of loose throw rugs in walkways, pet beds, electrical cords, etc?   yes      Grab bars in the bathroom? yes      Handrails on the stairs?   yes      Adequate lighting?   yes  Timed Get Up and Go performed: 7 seconds  Depression Screen PHQ 2/9 Scores 09/26/2017 08/22/2017 08/28/2016  PHQ - 2 Score 0 0 0     Cognitive Function MMSE - Mini Mental State Exam 05/21/2019 09/26/2017  Not completed: (No Data) (No Data)  Orientation to time 5 5  Orientation to Place 5 5  Registration 3 3  Attention/ Calculation 5 5  Recall 3 3  Language- name 2 objects 2 2  Language- repeat 1 1  Language- follow 3 step command 3 3  Language- read & follow direction 1 1  Write a sentence 1 1  Copy design 0 1  Total score 29 30        Immunization History  Administered Date(s) Administered  . Influenza Whole 07/08/2012, 07/10/2018  . Influenza, High Dose Seasonal PF 07/17/2017  . Influenza-Unspecified 07/26/2014, 06/09/2015, 07/19/2016  . PPD Test 09/07/1982  . Pneumococcal Conjugate-13 09/03/2017  . Pneumococcal Polysaccharide-23 10/08/1994  . Td 10/09/1995  . Tdap 09/03/2017  .  Zoster 10/08/2005    Qualifies for Shingles Vaccine? Needs  it   Screening Tests Health Maintenance  Topic Date Due  . FOOT EXAM  05/05/1937  . OPHTHALMOLOGY EXAM  05[birth year]05/1937  . HEMOGLOBIN A1C  04/07/2019  . INFLUENZA VACCINE  05/09/2019  . TETANUS/TDAP  09/04/2027  . PNA vac Low Risk Adult  Completed  . DEXA SCAN  Discontinued    Cancer Screenings: Lung: Low Dose CT Chest recommended if Age 44-80 years, 30 pack-year currently smoking OR have quit w/in 15years. No. Breast:  Up to date on Mammogram? No Up to date of Bone Density/Dexa? declined Colorectal: No  Additional Screenings: none Hepatitis C Screening:      Plan:   obtain Shingles vaccine   I have personally reviewed and noted the following in the patient's chart:   . Medical and social history . Use of alcohol, tobacco or illicit drugs  . Current medications and supplements . Functional ability and status . Nutritional status . Physical activity . Advanced directives . List of other physicians . Hospitalizations, surgeries, and ER visits in previous 12 months . Vitals . Screenings to include cognitive, depression, and falls . Referrals and appointments  In addition, I have reviewed and discussed with patient certain preventive protocols, quality metrics, and best practice recommendations. A written personalized care plan for preventive services as well as general preventive health recommendations were provided to patient.      X , NP  05/26/2019       Subjective:   Jacqueline Dodson is a 85 y.o. female who presents for Medicare Annual (Subsequent) preventive examination.  Review of Systems:   Cardiac Risk Factors include: advanced age (>4men, >43 women);hypertension     Objective:     Vitals: BP 110/60   Pulse 75   Temp 98.2 F (36.8 C)   Resp 18   Ht 5\' 6"  (1.676 m)   Wt 145 lb (65.8 kg)   SpO2 98%   BMI 23.40 kg/m   Body mass index is 23.4 kg/m.  Advanced Directives 09/26/2017 08/22/2017 02/07/2017 05/31/2016 06/30/2015 05/10/2015 12/12/2014  Does  Patient Have a Medical Advance Directive? Yes Yes Yes Yes Yes Yes No  Type of Paramedic of [place];Living will [place];Living will [place];Living will [place];Living will [place];Living will [place];Living will -  Does patient want to make changes to medical advance directive? - No - Patient declined No - Patient declined No - Patient declined - - -  Copy of [place] in Chart? Yes Yes Yes Yes Yes - -  Would patient like information on creating a medical advance directive? - - - - - - No - patient declined information    Tobacco Social History   Tobacco Use  Smoking Status Never Smoker  Smokeless Tobacco Never Used     Counseling given: Not Answered   Clinical Intake:  Pre-visit preparation completed: Yes  Pain : 0-10 Pain Score: 4  Pain Type: Neuropathic pain Pain Location: Heel Pain Orientation: Right Pain Radiating Towards: localized. Pain Descriptors / Indicators: Burning, Squeezing Pain Onset: In the past 7 days(lasted about 6-7 hours) Pain Frequency: (OCCASIONALLY) Pain Relieving Factors: standing on the heel helped, f/u Neurology Effect of Pain on Daily Activities: needs walker for walking  Pain Relieving Factors: standing on the heel helped, f/u Neurology  Nutritional Status: BMI of 19-24  Normal Nutritional Risks: None Diabetes: No  How  often do you need to have someone help you when you read instructions, pamphlets, or other written materials from your doctor or pharmacy?: 1 - Never What is the last grade level you completed in school?: college.  Interpreter Needed?: No  Information entered by ::  Bretta Bang NP  Past Medical History:  Diagnosis Date  . Abnormality of gait   . Acute, but ill-defined, cerebrovascular disease   . Anxiety   . Arthritis   . Chronic kidney disease, stage II (mild)   . Cystocele,  midline   . Dermatophytosis of the body   . Disturbance of skin sensation   . Dizziness and giddiness   . Duodenal ulcer, unspecified as acute or chronic, without hemorrhage, perforation, or obstruction   . Edema   . Elevated blood pressure reading without diagnosis of hypertension   . GERD (gastroesophageal reflux disease)   . Hemorrhoids   . Hypertension   . Left bundle branch hemiblock   . Lumbago   . Macular degeneration (senile) of retina, unspecified   . Myalgia and myositis, unspecified   . Orthostatic hypotension   . Other abnormal blood chemistry   . Other malaise and fatigue   . Personal history of fall   . Polyneuropathy in other diseases classified elsewhere ([place]) 02/16/2014  . Postmenopausal atrophic vaginitis   . 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 Rectocele   . Restless legs syndrome (RLS)   . Unspecified constipation   . Unspecified hereditary and idiopathic peripheral neuropathy   . Urgency of urination    Past Surgical History:  Procedure Laterality Date  . ABDOMINAL HYSTERECTOMY  1972  . APPENDECTOMY  1945  . CATARACT EXTRACTION Bilateral   . EXTERNAL EAR SURGERY  2010   left outer ear basel cell removed  . TONSILLECTOMY  1940   Family History  Problem Relation Age of Onset  . Aortic stenosis Mother   . Heart failure Mother   . Thrombosis Father   . Coronary artery disease Father   . Ataxia Sister   . Ataxia Brother   . Heart disease Brother   . Cancer Brother        Prostate  . Stroke Brother    Social History   Socioeconomic History  . Marital status: Widowed    Spouse name: Not on file  . Number of children: 5  . Years of education: college  . Highest education level: Not on file  Occupational History  . Occupation: Retired    Comment: Control and instrumentation engineer  Social Needs  . Financial resource strain: Not hard at all  . Food insecurity    Worry: Never true    Inability: Never true  . Transportation needs    Medical: Patient  refused    Non-medical: Patient refused  Tobacco Use  . Smoking status: Never Smoker  . Smokeless tobacco: Never Used  Substance and Sexual Activity  . Alcohol use: No    Alcohol/week: 0.0 standard drinks  . Drug use: No  . Sexual activity: Never  Lifestyle  . Physical activity    Days per week: 3 days    Minutes per session: 30 min  . Stress: Not at all  Relationships  . Social connections    Talks on phone: More than three times a week    Gets together: More than three times a week    Attends religious service: More than 4 times per year    Active member of club or organization: Yes  Attends meetings of clubs or organizations: More than 4 times per year    Relationship status: Widowed  Other Topics Concern  . Not on file  Social History Narrative   Patient is widowed, with 5 children.   Patient is right handed.   Patient has college education.   Patient does not drink caffeine.    Outpatient Encounter Medications as of 05/21/2019  Medication Sig  . acetaminophen (TYLENOL) 500 MG tablet Take 500 mg by mouth as needed.  . calcium carbonate (TUMS - DOSED IN MG ELEMENTAL CALCIUM) 500 MG chewable tablet Chew 1 tablet by mouth as needed for indigestion or heartburn.   . cholecalciferol (VITAMIN D) 25 MCG (1000 UT) tablet Take 1,000 Units by mouth daily.  Marland Kitchen gabapentin (NEURONTIN) 100 MG capsule TAKE 1 CAPSULE TWICE DAILY  . labetalol (NORMODYNE) 100 MG tablet TAKE 1 TABLET TWICE A DAY  . LORazepam (ATIVAN) 0.5 MG tablet Take 1 tablet (0.5 mg total) by mouth at bedtime.  . Multiple Vitamins-Minerals (ICAPS) TABS Take 1 tablet by mouth daily.  . Multiple Vitamins-Minerals (MULTIVITAMIN WITH MINERALS) tablet Take 1 tablet by mouth daily.  Marland Kitchen NIFEdipine (PROCARDIA XL/NIFEDICAL XL) 60 MG 24 hr tablet TAKE 1 TABLET DAILY FOR    BLOOD PRESSURE  . omeprazole (PRILOSEC OTC) 20 MG tablet Take 20 mg by mouth every other day.   . Probiotic Product (PROBIOTIC ADVANCED PO) Take by mouth.  .  telmisartan (MICARDIS) 80 MG tablet TAKE 1 TABLET ONCE DAILY   FOR BLOOD PRESSURE  . [DISCONTINUED] psyllium (METAMUCIL) 58.6 % powder Take 1 packet by mouth every other day.    No facility-administered encounter medications on file as of 05/21/2019.     Activities of Daily Living In your present state of health, do you have any difficulty performing the following activities: 05/21/2019  Hearing? N  Vision? N  Difficulty concentrating or making decisions? N  Walking or climbing stairs? N  Comment using walker  Dressing or bathing? N  Doing errands, shopping? N  Preparing Food and eating ? N  Using the Toilet? Y  Comment incontinent of urine  In the past six months, have you accidently leaked urine? Y  Do you have problems with loss of bowel control? Y  aging your Medications? N  aging your Finances? N  Housekeeping or managing your Housekeeping? N  Some recent data might be hidden    Patient Care Team: ,  X, NP as PCP - General (Internal Medicine) Lavonna Monarch, MD as Consulting Physician (Dermatology) Clent Jacks, MD as Consulting Physician (Ophthalmology) Elmarie Shiley, MD as Consulting Physician (Nephrology) Zadie Rhine Clent Demark, MD as Consulting Physician (Ophthalmology) [place] ,  X, NP as Nurse Practitioner (Internal Medicine)    Assessment:   This is a routine wellness examination for [place].  Exercise Activities and Dietary recommendations Current Exercise Habits: Home exercise routine, Type of exercise: walking, Time (Minutes): 20, Frequency (Times/Week): 7, Weekly Exercise (Minutes/Week): 140, Intensity: Mild, Exercise limited by: neurologic condition(s)(sometimes)  Goals    . DIET - INCREASE WATER INTAKE     During day before dinner    . Increase physical activity     Uses more NuStep machine when available       Fall Risk Fall Risk  04/02/2019 08/22/2018 09/26/2017 08/22/2017 08/28/2016  Falls in the past year? 0 0 No No No   Comment - Emmi Telephone Survey: data to providers prior to load - - -  Number falls in past yr: 0 - - - -  Injury with Fall? 0 - - - -  Risk for fall due to : - - Impaired balance/gait;Impaired vision - -   Is the patient's home free of loose throw rugs in walkways, pet beds, electrical cords, etc?   yes      Grab bars in the bathroom? yes      Handrails on the stairs?   yes      Adequate lighting?   yes  Timed Get Up and Go performed: 5 seonds  Depression Screen PHQ 2/9 Scores 09/26/2017 08/22/2017 08/28/2016  PHQ - 2 Score 0 0 0     Cognitive Function MMSE - Mini Mental State Exam 05/21/2019 09/26/2017  Not completed: (No Data) (No Data)  Orientation to time 5 5  Orientation to Place 5 5  Registration 3 3  Attention/ Calculation 5 5  Recall 3 3  Language- name 2 objects 2 2  Language- repeat 1 1  Language- follow 3 step command 3 3  Language- read & follow direction 1 1  Write a sentence 1 1  Copy design 0 1  Total score 29 30        Immunization History  Administered Date(s) Administered  . Influenza Whole 07/08/2012, 07/10/2018  . Influenza, High Dose Seasonal PF 07/17/2017  . Influenza-Unspecified 07/26/2014, 06/09/2015, 07/19/2016  . PPD Test 09/07/1982  . Pneumococcal Conjugate-13 09/03/2017  . Pneumococcal Polysaccharide-23 10/08/1994  . Td 10/09/1995  . Tdap 09/03/2017  . Zoster 10/08/2005    Qualifies for Shingles Vaccine?yes  Screening Tests Health Maintenance  Topic Date Due  . FOOT EXAM  05/05/1937  . OPHTHALMOLOGY EXAM  05/05/1937  . HEMOGLOBIN A1C  04/07/2019  . INFLUENZA VACCINE  05/09/2019  . TETANUS/TDAP  09/04/2027  . PNA vac Low Risk Adult  Completed  . DEXA SCAN  Discontinued    Cancer Screenings: Lung: Low Dose CT Chest recommended if Age 48-80 years, 30 pack-year currently smoking OR have quit w/in 15years. Patient does not qualify. Breast:  Up to date on Mammogram? No   Up to date of Bone Density/Dexa? No Colorectal: No   Additional Screenings: No Hepatitis C Screening:      Plan:   Shingrix needed  I have personally reviewed and noted the following in the patient's chart:   . Medical and social history . Use of alcohol, tobacco or illicit drugs  . Current medications and supplements . Functional ability and status . Nutritional status . Physical activity . Advanced directives . List of other physicians . Hospitalizations, surgeries, and ER visits in previous 12 months . Vitals . Screenings to include cognitive, depression, and falls . Referrals and appointments  In addition, I have reviewed and discussed with patient certain preventive protocols, quality metrics, and best practice recommendations. A written personalized care plan for preventive services as well as general preventive health recommendations were provided to patient.      X , NP  05/26/2019

## 2019-05-21 NOTE — Patient Instructions (Addendum)
Jacqueline Dodson , Thank you for taking time to come for your Medicare Wellness Visit. I appreciate your ongoing commitment to your health goals. Please review the following plan we discussed and let me know if I can assist you in the future.   Preventive Care 40-64 Years, Female Preventive care refers to lifestyle choices and visits with your health care provider that can promote health and wellness. What does preventive care include?  A yearly physical exam. This is also called an annual well check.  Dental exams once or twice a year.  Routine eye exams. Ask your health care provider how often you should have your eyes checked.  Personal lifestyle choices, including:  Daily care of your teeth and gums.  Regular physical activity.  Eating a healthy diet.  Avoiding tobacco and drug use.  Limiting alcohol use.  Practicing safe sex.  Taking low-dose aspirin daily starting at age 52.  Taking vitamin and mineral supplements as recommended by your health care provider. What happens during an annual well check? The services and screenings done by your health care provider during your annual well check will depend on your age, overall health, lifestyle risk factors, and family history of disease. Counseling  Your health care provider may ask you questions about your:  Alcohol use.  Tobacco use.  Drug use.  Emotional well-being.  Home and relationship well-being.  Sexual activity.  Eating habits.  Work and work Statistician.  Method of birth control.  Menstrual cycle.  Pregnancy history. Screening  You may have the following tests or measurements:  Height, weight, and BMI.  Blood pressure.  Lipid and cholesterol levels. These may be checked every 5 years, or more frequently if you are over 71 years old.  Skin check.  Lung cancer screening. You may have this screening every year starting at age 37 if you have a 30-pack-year history of smoking and currently smoke or  have quit within the past 15 years.  Fecal occult blood test (FOBT) of the stool. You may have this test every year starting at age 23.  Flexible sigmoidoscopy or colonoscopy. You may have a sigmoidoscopy every 5 years or a colonoscopy every 10 years starting at age 86.  Hepatitis C blood test.  Hepatitis B blood test.  Sexually transmitted disease (STD) testing.  Diabetes screening. This is done by checking your blood sugar (glucose) after you have not eaten for a while (fasting). You may have this done every 1-3 years.  Mammogram. This may be done every 1-2 years. Talk to your health care provider about when you should start having regular mammograms. This may depend on whether you have a family history of breast cancer.  BRCA-related cancer screening. This may be done if you have a family history of breast, ovarian, tubal, or peritoneal cancers.  Pelvic exam and Pap test. This may be done every 3 years starting at age 26. Starting at age 34, this may be done every 5 years if you have a Pap test in combination with an HPV test.  Bone density scan. This is done to screen for osteoporosis. You may have this scan if you are at high risk for osteoporosis. Discuss your test results, treatment options, and if necessary, the need for more tests with your health care provider. Vaccines  Your health care provider may recommend certain vaccines, such as:  Influenza vaccine. This is recommended every year.  Tetanus, diphtheria, and acellular pertussis (Tdap, Td) vaccine. You may need a Td booster every  10 years.  Zoster vaccine. You may need this after age 60.  Pneumococcal 13-valent conjugate (PCV13) vaccine. You may need this if you have certain conditions and were not previously vaccinated.  Pneumococcal polysaccharide (PPSV23) vaccine. You may need one or two doses if you smoke cigarettes or if you have certain conditions. Talk to your health care provider about which screenings and  vaccines you need and how often you need them. This information is not intended to replace advice given to you by your health care provider. Make sure you discuss any questions you have with your health care provider. Document Released: 10/21/2015 Document Revised: 06/13/2016 Document Reviewed: 07/26/2015 Elsevier Interactive Patient Education  2017 [employer]    Fall Prevention in the Home Falls can cause injuries. They can happen to people of all ages. There are many things you can do to make your home safe and to help prevent falls. What can I do on the outside of my home?  Regularly fix the edges of walkways and driveways and fix any cracks.  Remove anything that might make you trip as you walk through a door, such as a raised step or threshold.  Trim any bushes or trees on the path to your home.  Use bright outdoor lighting.  Clear any walking paths of anything that might make someone trip, such as rocks or tools.  Regularly check to see if handrails are loose or broken. Make sure that both sides of any steps have handrails.  Any raised decks and porches should have guardrails on the edges.  Have any leaves, snow, or ice cleared regularly.  Use sand or salt on walking paths during winter.  Clean up any spills in your garage right away. This includes oil or grease spills. What can I do in the bathroom?  Use night lights.  Install grab bars by the toilet and in the tub and shower. Do not use towel bars as grab bars.  Use non-skid mats or decals in the tub or shower.  If you need to sit down in the shower, use a plastic, non-slip stool.  Keep the floor dry. Clean up any water that spills on the floor as soon as it happens.  Remove soap buildup in the tub or shower regularly.  Attach bath mats securely with double-sided non-slip rug tape.  Do not have throw rugs and other things on the floor that can make you trip. What can I do in the bedroom?  Use night lights.   Make sure that you have a light by your bed that is easy to reach.  Do not use any sheets or blankets that are too big for your bed. They should not hang down onto the floor.  Have a firm chair that has side arms. You can use this for support while you get dressed.  Do not have throw rugs and other things on the floor that can make you trip. What can I do in the kitchen?  Clean up any spills right away.  Avoid walking on wet floors.  Keep items that you use a lot in easy-to-reach places.  If you need to reach something above you, use a strong step stool that has a grab bar.  Keep electrical cords out of the way.  Do not use floor polish or wax that makes floors slippery. If you must use wax, use non-skid floor wax.  Do not have throw rugs and other things on the floor that can make you trip.   What can I do with my stairs?  Do not leave any items on the stairs.  Make sure that there are handrails on both sides of the stairs and use them. Fix handrails that are broken or loose. Make sure that handrails are as long as the stairways.  Check any carpeting to make sure that it is firmly attached to the stairs. Fix any carpet that is loose or worn.  Avoid having throw rugs at the top or bottom of the stairs. If you do have throw rugs, attach them to the floor with carpet tape.  Make sure that you have a light switch at the top of the stairs and the bottom of the stairs. If you do not have them, ask someone to add them for you. What else can I do to help prevent falls?  Wear shoes that:  Do not have high heels.  Have rubber bottoms.  Are comfortable and fit you well.  Are closed at the toe. Do not wear sandals.  If you use a stepladder:  Make sure that it is fully opened. Do not climb a closed stepladder.  Make sure that both sides of the stepladder are locked into place.  Ask someone to hold it for you, if possible.  Clearly mark and make sure that you can see:  Any  grab bars or handrails.  First and last steps.  Where the edge of each step is.  Use tools that help you move around (mobility aids) if they are needed. These include:  Canes.  Walkers.  Scooters.  Crutches.  Turn on the lights when you go into a dark area. Replace any light bulbs as soon as they burn out.  Set up your furniture so you have a clear path. Avoid moving your furniture around.  If any of your floors are uneven, fix them.  If there are any pets around you, be aware of where they are.  Review your medicines with your doctor. Some medicines can make you feel dizzy. This can increase your chance of falling. Ask your doctor what other things that you can do to help prevent falls. This information is not intended to replace advice given to you by your health care provider. Make sure you discuss any questions you have with your health care provider. Document Released: 07/21/2009 Document Revised: 03/01/2016 Document Reviewed: 10/29/2014 Elsevier Interactive Patient Education  2017 [employer].   [facility] order sent

## 2019-06-01 ENCOUNTER — Ambulatory Visit (INDEPENDENT_AMBULATORY_CARE_PROVIDER_SITE_OTHER): Payer: Medicare Other | Admitting: Adult Health

## 2019-06-01 ENCOUNTER — Encounter: Payer: Self-pay | Admitting: Adult Health

## 2019-06-01 ENCOUNTER — Other Ambulatory Visit: Payer: Self-pay | Admitting: Adult Health

## 2019-06-01 ENCOUNTER — Other Ambulatory Visit: Payer: Self-pay

## 2019-06-01 VITALS — BP 109/59 | HR 64 | Temp 97.5°F | Ht 66.0 in | Wt 140.0 lb

## 2019-06-01 DIAGNOSIS — G609 Hereditary and idiopathic neuropathy, unspecified: Secondary | ICD-10-CM | POA: Diagnosis not present

## 2019-06-01 DIAGNOSIS — G2581 Restless legs syndrome: Secondary | ICD-10-CM

## 2019-06-01 NOTE — Progress Notes (Signed)
I have read the note, and I agree with the clinical assessment and plan.  Charles K Willis   

## 2019-06-01 NOTE — Patient Instructions (Addendum)
Your Plan:  Continue gabapentin  If your symptoms worsen or you develop new symptoms please let us know.    Thank you for coming to see us at [facility]. I hope we have been able to provide you high quality care today.  You may receive a patient satisfaction survey over the next few weeks. We would appreciate your feedback and comments so that we may continue to improve ourselves and the health of our patients.  

## 2019-06-01 NOTE — Progress Notes (Signed)
PATIENT: Jacqueline Dodson DOB: 10-13-1926  REASON FOR VISIT: follow up HISTORY FROM: patient  HISTORY OF PRESENT ILLNESS: Today 06/01/19: Jacqueline Dodson is a 85 year old female with a history of neuropathy.  She returns today for follow-up.  She states her neuropathy is under relatively good control.  She states about a month ago she had sharp pain in the right heel.  She states that this lasted 4 to 5 hours.  She is now taking gabapentin at noon and in the evening.  She reports that she is sleeping better.  She continues to use a Rollator.  She does feel weaker and more tired.  She did have a fall in June when she got up at night to go to the bathroom.  She states that the walker got ahead of her.  She continues to live at friend's home in independent living.  She returns today for an evaluation.  05/26/18  Jacqueline Dodson is an 85 year old female with a history of neuropathy.  She returns today for follow-up.  Continues to have burning sensations in the bottom of the feet primarily in the afternoons.  She states this is also when she gets restless legs.  She denies any significant changes with her gait or balance.  She does use a Rollator when ambulating.  She reports that she had a fall in March.  Afterwards she did go through some physical therapy which was beneficial.  She states that she does not feel as strong as she used to be.  She reports that she never tried Mirapex.  She states that she does not want to necessarily add on any new medication at this time.  She returns today for evaluation.  HISTORY   REVIEW OF SYSTEMS: Out of a complete 14 system review of symptoms, the patient complains only of the following symptoms, and all other reviewed systems are negative.  See HPI  ALLERGIES: Allergies  Allergen Reactions  . Amlodipine Other (See Comments)    Reaction:  Critically decreased sodium and potassium levels.    . Enalapril Other (See Comments)    Reaction:  Unknown   . Hctz  [Hydrochlorothiazide] Other (See Comments)    Reaction:  Unknown   . Penicillins Other (See Comments)    Reaction:  Unknown   . Amoxicillin Swelling and Rash    HOME MEDICATIONS: Outpatient Medications Prior to Visit  Medication Sig Dispense Refill  . acetaminophen (TYLENOL) 500 MG tablet Take 500 mg by mouth as needed.    . calcium carbonate (TUMS - DOSED IN MG ELEMENTAL CALCIUM) 500 MG chewable tablet Chew 1 tablet by mouth as needed for indigestion or heartburn.     . cholecalciferol (VITAMIN D) 25 MCG (1000 UT) tablet Take 1,000 Units by mouth daily.    Marland Kitchen gabapentin (NEURONTIN) 100 MG capsule TAKE 1 CAPSULE TWICE DAILY 180 capsule 1  . labetalol (NORMODYNE) 100 MG tablet TAKE 1 TABLET TWICE A DAY 180 tablet 1  . LORazepam (ATIVAN) 0.5 MG tablet Take 1 tablet (0.5 mg total) by mouth at bedtime. 30 tablet 0  . Multiple Vitamins-Minerals (ICAPS) TABS Take 1 tablet by mouth daily.    . Multiple Vitamins-Minerals (MULTIVITAMIN WITH MINERALS) tablet Take 1 tablet by mouth daily.    Marland Kitchen NIFEdipine (PROCARDIA XL/NIFEDICAL XL) 60 MG 24 hr tablet TAKE 1 TABLET DAILY FOR    BLOOD PRESSURE 90 tablet 1  . omeprazole (PRILOSEC OTC) 20 MG tablet Take 20 mg by mouth every other day.     Marland Kitchen  Probiotic Product (PROBIOTIC ADVANCED PO) Take by mouth.    . telmisartan (MICARDIS) 80 MG tablet TAKE 1 TABLET ONCE DAILY   FOR BLOOD PRESSURE 90 tablet 1   No facility-administered medications prior to visit.     PAST MEDICAL HISTORY: Past Medical History:  Diagnosis Date  . Abnormality of gait   . Acute, but ill-defined, cerebrovascular disease   . Anxiety   . Arthritis   . Chronic kidney disease, stage II (mild)   . Cystocele, midline   . Dermatophytosis of the body   . Disturbance of skin sensation   . Dizziness and giddiness   . Duodenal ulcer, unspecified as acute or chronic, without hemorrhage, perforation, or obstruction   . Edema   . Elevated blood pressure reading without diagnosis of  hypertension   . GERD (gastroesophageal reflux disease)   . Hemorrhoids   . Hypertension   . Left bundle branch hemiblock   . Lumbago   . Macular degeneration (senile) of retina, unspecified   . Myalgia and myositis, unspecified   . Orthostatic hypotension   . Other abnormal blood chemistry   . Other malaise and fatigue   . Personal history of fall   . Polyneuropathy in other diseases classified elsewhere ([place]) 02/16/2014  . Postmenopausal atrophic vaginitis   . 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 Rectocele   . Restless legs syndrome (RLS)   . Unspecified constipation   . Unspecified hereditary and idiopathic peripheral neuropathy   . Urgency of urination     PAST SURGICAL HISTORY: Past Surgical History:  Procedure Laterality Date  . ABDOMINAL HYSTERECTOMY  1972  . APPENDECTOMY  1945  . CATARACT EXTRACTION Bilateral   . EXTERNAL EAR SURGERY  2010   left outer ear basel cell removed  . TONSILLECTOMY  1940    FAMILY HISTORY: Family History  Problem Relation Age of Onset  . Aortic stenosis Mother   . Heart failure Mother   . Thrombosis Father   . Coronary artery disease Father   . Ataxia Sister   . Ataxia Brother   . Heart disease Brother   . Cancer Brother        Prostate  . Stroke Brother     SOCIAL HISTORY: Social History   Socioeconomic History  . Marital status: Widowed    Spouse name: Not on file  . Number of children: 5  . Years of education: college  . Highest education level: Not on file  Occupational History  . Occupation: Retired    Comment: Control and instrumentation engineer  Social Needs  . Financial resource strain: Not hard at all  . Food insecurity    Worry: Never true    Inability: Never true  . Transportation needs    Medical: Patient refused    Non-medical: Patient refused  Tobacco Use  . Smoking status: Never Smoker  . Smokeless tobacco: Never Used  Substance and Sexual Activity  . Alcohol use: No    Alcohol/week: 0.0 standard drinks  .  Drug use: No  . Sexual activity: Never  Lifestyle  . Physical activity    Days per week: 3 days    Minutes per session: 30 min  . Stress: Not at all  Relationships  . Social connections    Talks on phone: More than three times a week    Gets together: More than three times a week    Attends religious service: More than 4 times per year    Active member of club  or organization: Yes    Attends meetings of clubs or organizations: More than 4 times per year    Relationship status: Widowed  . Intimate partner violence    Fear of current or ex partner: No    Emotionally abused: No    Physically abused: No    Forced sexual activity: No  Other Topics Concern  . Not on file  Social History Narrative   Patient is widowed, with 5 children.   Patient is right handed.   Patient has college education.   Patient does not drink caffeine.      PHYSICAL EXAM  Vitals:   06/01/19 1010  BP: (!) 109/59  Pulse: 64  Temp: (!) 97.5 F (36.4 C)  TempSrc: Oral  Weight: 140 lb (63.5 kg)  Height: 5\' 6"  (1.676 m)   Body mass index is 22.6 kg/m.  Generalized: Well developed, in no acute distress   Neurological examination  Mentation: Alert oriented to time, place, history taking. Follows all commands speech and language fluent Cranial nerve II-XII: Pupils were equal round reactive to light. Extraocular movements were full, visual field were full on confrontational test. . Head turning and shoulder shrug  were normal and symmetric. Motor: The motor testing reveals 5 over 5 strength of all 4 extremities. Good symmetric motor tone is noted throughout.  Sensory: Sensory testing is intact to soft touch on all 4 extremities. No evidence of extinction is noted.  Coordination: Cerebellar testing reveals good finger-nose-finger and heel-to-shin bilaterally.  Gait and station: Patient uses a Rollator when ambulating. DIAGNOSTIC DATA (LABS, IMAGING, TESTING) - I reviewed patient records, labs, notes,  testing and imaging myself where available.  Lab Results  Component Value Date   WBC 5.9 12/29/2018   HGB 11.3 (L) 12/29/2018   HCT 33.6 (L) 12/29/2018   MCV 89.4 12/29/2018   PLT 245 12/29/2018      Component Value Date/Time   NA 139 01/14/2019 0700   NA 141 11/27/2018   K 4.3 01/14/2019 0700   CL 105 01/14/2019 0700   CL 108 11/27/2018   CO2 25 01/14/2019 0700   CO2 24 11/27/2018   GLUCOSE 105 (H) 01/14/2019 0700   BUN 29 (H) 01/14/2019 0700   BUN 32 (A) 11/27/2018   CREATININE 1.50 (H) 01/14/2019 0700   CALCIUM 9.2 01/14/2019 0700   CALCIUM 9.4 11/27/2018   PROT 6.5 01/14/2019 0700   ALBUMIN 4.0 11/27/2018   AST 18 01/14/2019 0700   ALT 15 01/14/2019 0700   ALKPHOS 55 02/11/2017 1030   BILITOT 0.3 01/14/2019 0700   GFRNONAA 31 (L) 01/14/2019 0700   GFRAA 35 (L) 01/14/2019 0700   Lab Results  Component Value Date   CHOL 238 (H) 10/07/2018   HDL 34 (L) 10/07/2018   LDLCALC 166 (H) 10/07/2018   TRIG 227 (H) 10/07/2018   CHOLHDL 7.0 (H) 10/07/2018   Lab Results  Component Value Date   HGBA1C 5.9 (H) 10/07/2018   No results found for: [id number] Lab Results  Component Value Date   TSH 3.83 10/07/2018      ASSESSMENT AND PLAN 85 y.o. year old female  has a past medical history of Abnormality of gait, Acute, but ill-defined, cerebrovascular disease, Anxiety, Arthritis, Chronic kidney disease, stage II (mild), Cystocele, midline, Dermatophytosis of the body, Disturbance of skin sensation, Dizziness and giddiness, Duodenal ulcer, unspecified as acute or chronic, without hemorrhage, perforation, or obstruction, Edema, Elevated blood pressure reading without diagnosis of hypertension, GERD (gastroesophageal reflux disease), Hemorrhoids, Hypertension,  Left bundle branch hemiblock, Lumbago, Macular degeneration (senile) of retina, unspecified, Myalgia and myositis, unspecified, Orthostatic hypotension, Other abnormal blood chemistry, Other malaise and fatigue, Personal  history of fall, Polyneuropathy in other diseases classified elsewhere ([place]) (02/16/2014), Postmenopausal atrophic vaginitis, Primary cerebellar degeneration (H[facility]), Rectocele, Restless legs syndrome (RLS), Unspecified constipation, Unspecified hereditary and idiopathic peripheral neuropathy, and Urgency of urination. here with:  1.  Peripheral neuropathy 2.  Restless leg syndrome  Overall the patient is doing well.  She will continue on gabapentin.  I have advised that if her symptoms worsen or she develops new symptoms she should let us know.  She will follow-up in 6 months or sooner if needed.    Ward Givens, MSN, NP-C 06/01/2019, 10:05 AM [facility] [address] [place], [place] [address] [phone]

## 2019-06-02 ENCOUNTER — Other Ambulatory Visit: Payer: Self-pay | Admitting: *Deleted

## 2019-06-02 DIAGNOSIS — F419 Anxiety disorder, unspecified: Secondary | ICD-10-CM

## 2019-06-02 MED ORDER — LORAZEPAM 0.5 MG PO TABS: 0.5000 mg | ORAL_TABLET | Freq: Every day | ORAL | 0 refills | Status: DC

## 2019-06-02 NOTE — Telephone Encounter (Signed)
Patient requested refill Pended and sent to [facility] for approval.

## 2019-07-06 ENCOUNTER — Other Ambulatory Visit: Payer: Self-pay | Admitting: *Deleted

## 2019-07-06 DIAGNOSIS — F419 Anxiety disorder, unspecified: Secondary | ICD-10-CM

## 2019-07-06 MED ORDER — LORAZEPAM 0.5 MG PO TABS: 0.5000 mg | ORAL_TABLET | Freq: Every day | ORAL | 0 refills | Status: DC

## 2019-07-06 NOTE — Telephone Encounter (Signed)
Patient requested refill Pended and sent to Dr. Lyndel Safe for approval.  Lennie Odor on [address].

## 2019-07-07 DIAGNOSIS — N3941 Urge incontinence: Secondary | ICD-10-CM | POA: Diagnosis not present

## 2019-07-07 DIAGNOSIS — N8111 Cystocele, midline: Secondary | ICD-10-CM | POA: Diagnosis not present

## 2019-07-07 DIAGNOSIS — N3946 Mixed incontinence: Secondary | ICD-10-CM | POA: Diagnosis not present

## 2019-07-13 ENCOUNTER — Other Ambulatory Visit: Payer: Self-pay | Admitting: Nurse Practitioner

## 2019-07-13 NOTE — Telephone Encounter (Signed)
High risk or very high risk warning populated when attempting to refill medication. RX request sent to PCP for review and approval if warranted.   

## 2019-08-03 ENCOUNTER — Other Ambulatory Visit: Payer: Self-pay | Admitting: *Deleted

## 2019-08-03 DIAGNOSIS — F419 Anxiety disorder, unspecified: Secondary | ICD-10-CM

## 2019-08-03 MED ORDER — LORAZEPAM 0.5 MG PO TABS: 0.5000 mg | ORAL_TABLET | Freq: Every day | ORAL | 0 refills | Status: DC

## 2019-08-03 NOTE — Telephone Encounter (Signed)
Patient requested refill Pended and sent to [facility] for approval.

## 2019-08-13 ENCOUNTER — Other Ambulatory Visit: Payer: Self-pay

## 2019-08-13 ENCOUNTER — Other Ambulatory Visit: Payer: Medicare Other

## 2019-08-13 ENCOUNTER — Telehealth: Payer: Self-pay

## 2019-08-13 DIAGNOSIS — I130 Hypertensive heart and chronic kidney disease with heart failure and stage 1 through stage 4 chronic kidney disease, or unspecified chronic kidney disease: Secondary | ICD-10-CM

## 2019-08-13 DIAGNOSIS — K219 Gastro-esophageal reflux disease without esophagitis: Secondary | ICD-10-CM

## 2019-08-13 DIAGNOSIS — E1122 Type 2 diabetes mellitus with diabetic chronic kidney disease: Secondary | ICD-10-CM

## 2019-08-13 DIAGNOSIS — E1169 Type 2 diabetes mellitus with other specified complication: Secondary | ICD-10-CM

## 2019-08-13 NOTE — Telephone Encounter (Signed)
Lab states patient missed lab apnt. Called patient to reschedule. Unable to reach patient.

## 2019-08-14 NOTE — Telephone Encounter (Signed)
Called patient. Patient currently on quaritine. She will call back to reschedule blood work once she is available to come back out.

## 2019-08-18 ENCOUNTER — Other Ambulatory Visit: Payer: Self-pay

## 2019-08-18 ENCOUNTER — Other Ambulatory Visit: Payer: Medicare Other

## 2019-08-19 LAB — COMPLETE METABOLIC PANEL WITH GFR
AG Ratio: 1.3 (calc) (ref 1.0–2.5)
ALT: 12 U/L (ref 6–29)
AST: 16 U/L (ref 10–35)
Albumin: 3.7 g/dL (ref 3.6–5.1)
Alkaline phosphatase (APISO): 59 U/L (ref 37–153)
BUN/Creatinine Ratio: 22 (calc) (ref 6–22)
BUN: 36 mg/dL — ABNORMAL HIGH (ref 7–25)
CO2: 25 mmol/L (ref 20–32)
Calcium: 9.1 mg/dL (ref 8.6–10.4)
Chloride: 107 mmol/L (ref 98–110)
Creat: 1.64 mg/dL — ABNORMAL HIGH (ref 0.60–0.88)
GFR, Est African American: 32 mL/min/{1.73_m2} — ABNORMAL LOW (ref >=60)
GFR, Est Non African American: 27 mL/min/{1.73_m2} — ABNORMAL LOW (ref >=60)
Globulin: 2.8 g/dL (calc) (ref 1.9–3.7)
Glucose, Bld: 106 mg/dL — ABNORMAL HIGH (ref 65–99)
Potassium: 3.9 mmol/L (ref 3.5–5.3)
Sodium: 143 mmol/L (ref 135–146)
Total Bilirubin: 0.4 mg/dL (ref 0.2–1.2)
Total Protein: 6.5 g/dL (ref 6.1–8.1)

## 2019-08-19 LAB — HEMOGLOBIN A1C
Hgb A1c MFr Bld: 5.5 % of total Hgb (ref <5.7)
Mean Plasma Glucose: 111 (calc)
eAG (mmol/L): 6.2 (calc)

## 2019-08-19 LAB — LIPID PANEL
Cholesterol: 213 mg/dL — ABNORMAL HIGH (ref <200)
HDL: 34 mg/dL — ABNORMAL LOW (ref >=50)
LDL Cholesterol (Calc): 150 mg/dL (calc) — ABNORMAL HIGH
Non-HDL Cholesterol (Calc): 179 mg/dL (calc) — ABNORMAL HIGH (ref <130)
Total CHOL/HDL Ratio: 6.3 (calc) — ABNORMAL HIGH (ref <5.0)
Triglycerides: 155 mg/dL — ABNORMAL HIGH (ref <150)

## 2019-08-19 LAB — CBC WITH DIFFERENTIAL/PLATELET
Absolute Monocytes: 434 cells/uL (ref 200–950)
Basophils Absolute: 19 cells/uL (ref 0–200)
Basophils Relative: 0.3 %
Eosinophils Absolute: 403 cells/uL (ref 15–500)
Eosinophils Relative: 6.5 %
HCT: 31.6 % — ABNORMAL LOW (ref 35.0–45.0)
Hemoglobin: 10.7 g/dL — ABNORMAL LOW (ref 11.7–15.5)
Lymphs Abs: 1823 cells/uL (ref 850–3900)
MCH: 30.3 pg (ref 27.0–33.0)
MCHC: 33.9 g/dL (ref 32.0–36.0)
MCV: 89.5 fL (ref 80.0–100.0)
MPV: 10.3 fL (ref 7.5–12.5)
Monocytes Relative: 7 %
Neutro Abs: 3522 cells/uL (ref 1500–7800)
Neutrophils Relative %: 56.8 %
Platelets: 177 10*3/uL (ref 140–400)
RBC: 3.53 10*6/uL — ABNORMAL LOW (ref 3.80–5.10)
RDW: 12.3 % (ref 11.0–15.0)
Total Lymphocyte: 29.4 %
WBC: 6.2 10*3/uL (ref 3.8–10.8)

## 2019-08-20 ENCOUNTER — Other Ambulatory Visit: Payer: Self-pay | Admitting: Nurse Practitioner

## 2019-08-20 DIAGNOSIS — I10 Essential (primary) hypertension: Secondary | ICD-10-CM

## 2019-08-21 ENCOUNTER — Other Ambulatory Visit: Payer: Self-pay

## 2019-08-21 ENCOUNTER — Non-Acute Institutional Stay: Payer: Medicare Other | Admitting: Internal Medicine

## 2019-08-21 ENCOUNTER — Encounter: Payer: Self-pay | Admitting: Internal Medicine

## 2019-08-21 VITALS — BP 114/54 | HR 66 | Temp 96.6°F | Ht 66.0 in | Wt 145.0 lb

## 2019-08-21 DIAGNOSIS — I10 Essential (primary) hypertension: Secondary | ICD-10-CM

## 2019-08-21 DIAGNOSIS — I129 Hypertensive chronic kidney disease with stage 1 through stage 4 chronic kidney disease, or unspecified chronic kidney disease: Secondary | ICD-10-CM | POA: Diagnosis not present

## 2019-08-21 DIAGNOSIS — N8110 Cystocele, unspecified: Secondary | ICD-10-CM | POA: Diagnosis not present

## 2019-08-21 DIAGNOSIS — E785 Hyperlipidemia, unspecified: Secondary | ICD-10-CM

## 2019-08-21 DIAGNOSIS — D631 Anemia in chronic kidney disease: Secondary | ICD-10-CM | POA: Diagnosis not present

## 2019-08-21 DIAGNOSIS — G609 Hereditary and idiopathic neuropathy, unspecified: Secondary | ICD-10-CM

## 2019-08-21 DIAGNOSIS — R0602 Shortness of breath: Secondary | ICD-10-CM

## 2019-08-21 DIAGNOSIS — K5904 Chronic idiopathic constipation: Secondary | ICD-10-CM | POA: Diagnosis not present

## 2019-08-21 DIAGNOSIS — E1169 Type 2 diabetes mellitus with other specified complication: Secondary | ICD-10-CM | POA: Diagnosis not present

## 2019-08-21 DIAGNOSIS — N184 Chronic kidney disease, stage 4 (severe): Secondary | ICD-10-CM

## 2019-08-21 NOTE — Progress Notes (Signed)
Location:  [place] of Service:  Clinic (12)  Provider:   Code Status:  Goals of Care:  Advanced Directives 09/26/2017  Does Patient Have a Medical Advance Directive? Yes  Type of Paramedic of [place];Living will  Does patient want to make changes to medical advance directive? -  Copy of [place] in Chart? Yes  Would patient like information on creating a medical advance directive? -     Chief Complaint  Patient presents with  . Medical Management of Chronic Issues    4 month follow up. Patient would like to discuss digestion ans SOB    HPI: Patient is a 85 y.o. female seen today for medical management of chronic diseases.   Patient has h/o  Hypertension Blood pressure has been controlled on multiple medications CKD stage IV Due to hypertension Creatinine has been stable Anemia Most likely due to CKD needs further studying Urinary incontinence Patient [facility].  She had UTI.  He treated back and also started her on nitrofurantoin Hyperlipidemia LDL high but not interested in any statin Shortness of breath This was new complaint by patient today she states she has noticed few episodes recently in which she gets tired and short of breath after walking usually gets resolved when she sits for few minutes.  Denies any cough fever chest pain PND. Constipation followed by Diarrhea  She gets constipated and then when she takes a laxative she gets severe diarrhea  Patient is independent does have family in town.  Walks with a walker.  Is little unsteady but has not had any falls recently  Past Medical History:  Diagnosis Date  . Abnormality of gait   . Acute, but ill-defined, cerebrovascular disease   . Anxiety   . Arthritis   . Chronic kidney disease, stage II (mild)   . Cystocele, midline   . Dermatophytosis of the body   . Disturbance of skin sensation   . Dizziness and giddiness    . Duodenal ulcer, unspecified as acute or chronic, without hemorrhage, perforation, or obstruction   . Edema   . Elevated blood pressure reading without diagnosis of hypertension   . GERD (gastroesophageal reflux disease)   . Hemorrhoids   . Hypertension   . Left bundle branch hemiblock   . Lumbago   . Macular degeneration (senile) of retina, unspecified   . Myalgia and myositis, unspecified   . Orthostatic hypotension   . Other abnormal blood chemistry   . Other malaise and fatigue   . Personal history of fall   . Polyneuropathy in other diseases classified elsewhere ([place]) 02/16/2014  . Postmenopausal atrophic vaginitis   . 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 Rectocele   . Restless legs syndrome (RLS)   . Unspecified constipation   . Unspecified hereditary and idiopathic peripheral neuropathy   . Urgency of urination     Past Surgical History:  Procedure Laterality Date  . ABDOMINAL HYSTERECTOMY  1972  . APPENDECTOMY  1945  . CATARACT EXTRACTION Bilateral   . EXTERNAL EAR SURGERY  2010   left outer ear basel cell removed  . TONSILLECTOMY  1940    Allergies  Allergen Reactions  . Amlodipine Other (See Comments)    Reaction:  Critically decreased sodium and potassium levels.    . Enalapril Other (See Comments)    Reaction:  Unknown   . Hctz [Hydrochlorothiazide] Other (See Comments)    Reaction:  Unknown   . Penicillins Other (See Comments)    Reaction:  Unknown   . Amoxicillin Swelling and Rash    Outpatient Encounter Medications as of 08/21/2019  Medication Sig  . acetaminophen (TYLENOL) 500 MG tablet Take 500 mg by mouth as needed.  . calcium carbonate (TUMS - DOSED IN MG ELEMENTAL CALCIUM) 500 MG chewable tablet Chew 1 tablet by mouth as needed for indigestion or heartburn.   . cholecalciferol (VITAMIN D) 25 MCG (1000 UT) tablet Take 1,000 Units by mouth daily.  Marland Kitchen gabapentin (NEURONTIN) 100 MG capsule TAKE 1 CAPSULE TWICE DAILY  . labetalol (NORMODYNE)  100 MG tablet TAKE 1 TABLET TWICE A DAY  . LORazepam (ATIVAN) 0.5 MG tablet Take 1 tablet (0.5 mg total) by mouth at bedtime.  . Multiple Vitamins-Minerals (MULTIVITAMIN WITH MINERALS) tablet Take 1 tablet by mouth daily.  . Multiple Vitamins-Minerals (PRESERVISION AREDS 2 PO) Take 1 tablet by mouth daily.  Marland Kitchen NIFEdipine (PROCARDIA XL/NIFEDICAL XL) 60 MG 24 hr tablet TAKE 1 TABLET DAILY FOR    BLOOD PRESSURE  . nitrofurantoin, macrocrystal-monohydrate, (MACROBID) 100 MG capsule Take 100 mg by mouth daily.  Marland Kitchen omeprazole (PRILOSEC OTC) 20 MG tablet Take 20 mg by mouth every other day.   . Probiotic Product (PROBIOTIC ADVANCED PO) Take by mouth.  . telmisartan (MICARDIS) 80 MG tablet TAKE 1 TABLET ONCE DAILY   FOR BLOOD PRESSURE   No facility-administered encounter medications on file as of 08/21/2019.     Review of Systems:  Review of Systems  All other systems reviewed and are negative. Except mentioned in Presenting Complain  Health Maintenance  Topic Date Due  . FOOT EXAM  [birth year]05/1937  . OPHTHALMOLOGY EXAM  05/05/1937  . HEMOGLOBIN A1C  02/15/2020  . TETANUS/TDAP  09/04/2027  . INFLUENZA VACCINE  Completed  . PNA vac Low Risk Adult  Completed  . DEXA SCAN  Discontinued    Physical Exam: Vitals:   08/21/19 1055  BP: (!) 114/54  Pulse: 66  Temp: (!) 96.6 F (35.9 C)  SpO2: 95%  Weight: 145 lb (65.8 kg)  Height: 5\' 6"  (1.676 m)   Body mass index is 23.4 kg/m. Physical Exam Constitutional: Oriented to person, place, and time. Well-developed and well-nourished.  HENT:  Head: Normocephalic.  Mouth/Throat: Oropharynx is clear and Dry Eyes: Pupils are equal, round, and reactive to light.  Neck: Neck supple.  Cardiovascular: Normal rate and normal heart sounds.  Positive for Murmur Pulmonary/Chest: Effort normal and breath sounds normal. No respiratory distress. No wheezes. She has no rales.  Abdominal: Soft. Bowel sounds are normal. No distension. There is no tenderness.  There is no rebound.  Musculoskeletal: No edema.  Lymphadenopathy: none Neurological: Alert and oriented to person, place, and time. Mildily unstable gait without the walker Was slow getting up from the chair Skin: Skin is warm and dry.  Psychiatric: Normal mood and affect. Behavior is normal. Thought content normal.  Labs reviewed: Basic Metabolic Panel: Recent Labs    10/07/18 0000 11/27/18 12/29/18 0000 01/14/19 0700 08/18/19 0705  NA 140 141 142 139 143  K 4.1 4.3 4.5 4.3 3.9  CL 106 108 106 105 107  CO2 24 24 27 25 25   GLUCOSE 108*  --  103* 105* 106*  BUN 29* 32* 34* 29* 36*  CREATININE 1.64* 1.6* 1.77* 1.50* 1.64*  CALCIUM 9.3 9.4 9.2 9.2 9.1  MG  --  2.1  --   --   --   PHOS  --  4.2  --   --   --   TSH 3.83  --   --   --   --    Liver Function Tests: Recent Labs    11/27/18 12/29/18 0000 01/14/19 0700 08/18/19 0705  AST  --  16 18 16   ALT  --  14 15 12   BILITOT  --  0.3 0.3 0.4  PROT  --  6.5 6.5 6.5  ALBUMIN 4.0  --   --   --    No results for input(s): LIPASE, AMYLASE in the last 8760 hours. No results for input(s): AMMONIA in the last 8760 hours. CBC: Recent Labs    10/07/18 0000 11/27/18 12/29/18 0000 08/18/19 0705  WBC 6.2 6.1 5.9 6.2  NEUTROABS 2,623  --  2,749 3,522  HGB 11.5* 11.8* 11.3* 10.7*  HCT 33.6* 34* 33.6* 31.6*  MCV 88.4  --  89.4 89.5  PLT 229 203 245 177   Lipid Panel: Recent Labs    10/07/18 0000 08/18/19 0705  CHOL 238* 213*  HDL 34* 34*  LDLCALC 166* 150*  TRIG 227* 155*  CHOLHDL 7.0* 6.3*   Lab Results  Component Value Date   HGBA1C 5.5 08/18/2019    Procedures since last visit: No results found.  Assessment/Plan Cystocele, with UTI Was started on Nitrofurantoin by Urology as she had UTI Patient has stage4 CKD  And we talked about recommendations of not using Nitrofurantoin for long term She will stop after finishing this month  Chronic idiopathic constipation Alternationg with explosive diarrhea Start  on Senna Plus QHS and see if ti helps  SOB (shortness of breath) on exertion I recommended to start with Chest Xray and ECHO Patient wants to wait till next visit Will see Manxi in 5 weeks. If Symptoms persists will need Xray and ECHO  HTN (hypertension), benign Controlled on Multiple Meds  Idiopathic peripheral neuropathy Stable on Neurontin Hyperlipidemia  Does not want to try Statin   CKD stage 4 secondary to hypertension ([place]) Has seen nephrology Before Will need close follow up Patient aware not to take OTC NSAIDS  Anemia due to stage 4 chronic kidney disease ([place]) Will order iron studies and B12 Will need low dose of Iron    Labs/tests ordered:  * No order type specified * Next appt:  Visit date not found  Total time spent in this patient care encounter was  45_  minutes; greater than 50% of the visit spent counseling patient and staff, reviewing records , Labs and coordinating care for problems addressed at this encounter.

## 2019-08-22 ENCOUNTER — Other Ambulatory Visit: Payer: Self-pay | Admitting: Nurse Practitioner

## 2019-08-24 ENCOUNTER — Other Ambulatory Visit: Payer: Self-pay

## 2019-08-24 ENCOUNTER — Emergency Department (HOSPITAL_COMMUNITY): Payer: Medicare Other

## 2019-08-24 ENCOUNTER — Encounter (HOSPITAL_COMMUNITY): Payer: Self-pay | Admitting: Emergency Medicine

## 2019-08-24 ENCOUNTER — Emergency Department (HOSPITAL_COMMUNITY)
Admission: EM | Admit: 2019-08-24 | Discharge: 2019-08-24 | Disposition: A | Discharge status: Home / Self Care | Payer: Medicare Other | Attending: Emergency Medicine | Admitting: Emergency Medicine

## 2019-08-24 DIAGNOSIS — I129 Hypertensive chronic kidney disease with stage 1 through stage 4 chronic kidney disease, or unspecified chronic kidney disease: Secondary | ICD-10-CM | POA: Insufficient documentation

## 2019-08-24 DIAGNOSIS — N183 Chronic kidney disease, stage 3 unspecified: Secondary | ICD-10-CM | POA: Insufficient documentation

## 2019-08-24 DIAGNOSIS — Z79899 Other long term (current) drug therapy: Secondary | ICD-10-CM | POA: Insufficient documentation

## 2019-08-24 DIAGNOSIS — E1122 Type 2 diabetes mellitus with diabetic chronic kidney disease: Secondary | ICD-10-CM | POA: Insufficient documentation

## 2019-08-24 DIAGNOSIS — Z20828 Contact with and (suspected) exposure to other viral communicable diseases: Secondary | ICD-10-CM | POA: Insufficient documentation

## 2019-08-24 DIAGNOSIS — J181 Lobar pneumonia, unspecified organism: Secondary | ICD-10-CM | POA: Diagnosis not present

## 2019-08-24 DIAGNOSIS — J189 Pneumonia, unspecified organism: Secondary | ICD-10-CM | POA: Insufficient documentation

## 2019-08-24 DIAGNOSIS — R0602 Shortness of breath: Secondary | ICD-10-CM | POA: Diagnosis not present

## 2019-08-24 LAB — BASIC METABOLIC PANEL
Anion gap: 8 (ref 5–15)
BUN: 30 mg/dL — ABNORMAL HIGH (ref 8–23)
CO2: 21 mmol/L — ABNORMAL LOW (ref 22–32)
Calcium: 9.2 mg/dL (ref 8.9–10.3)
Chloride: 109 mmol/L (ref 98–111)
Creatinine, Ser: 1.69 mg/dL — ABNORMAL HIGH (ref 0.44–1.00)
GFR calc Af Amer: 30 mL/min — ABNORMAL LOW (ref >60)
GFR calc non Af Amer: 26 mL/min — ABNORMAL LOW (ref >60)
Glucose, Bld: 106 mg/dL — ABNORMAL HIGH (ref 70–99)
Potassium: 3.9 mmol/L (ref 3.5–5.1)
Sodium: 138 mmol/L (ref 135–145)

## 2019-08-24 LAB — CBC
HCT: 32.5 % — ABNORMAL LOW (ref 36.0–46.0)
Hemoglobin: 10.7 g/dL — ABNORMAL LOW (ref 12.0–15.0)
MCH: 29.8 pg (ref 26.0–34.0)
MCHC: 32.9 g/dL (ref 30.0–36.0)
MCV: 90.5 fL (ref 80.0–100.0)
Platelets: 232 10*3/uL (ref 150–400)
RBC: 3.59 MIL/uL — ABNORMAL LOW (ref 3.87–5.11)
RDW: 11.8 % (ref 11.5–15.5)
WBC: 9.9 10*3/uL (ref 4.0–10.5)
nRBC: 0 % (ref 0.0–0.2)

## 2019-08-24 LAB — BRAIN NATRIURETIC PEPTIDE: B Natriuretic Peptide: 512.9 pg/mL — ABNORMAL HIGH (ref 0.0–100.0)

## 2019-08-24 LAB — TROPONIN I (HIGH SENSITIVITY): Troponin I (High Sensitivity): 12 ng/L (ref <18)

## 2019-08-24 LAB — SARS CORONAVIRUS 2 (TAT 6-24 HRS): SARS Coronavirus 2: NEGATIVE

## 2019-08-24 MED ORDER — LEVOFLOXACIN IN D5W 750 MG/150ML IV SOLN
750.0000 mg | Freq: Once | INTRAVENOUS | Status: AC
  Administered 2019-08-24: 750 mg via INTRAVENOUS
  Filled 2019-08-24: qty 150

## 2019-08-24 MED ORDER — LEVOFLOXACIN 250 MG PO TABS: 375.0000 mg | ORAL_TABLET | Freq: Every day | ORAL | 0 refills | Status: AC

## 2019-08-24 NOTE — Discharge Instructions (Signed)
Take the antibiotics as prescribed, follow-up with your primary care doctor to make sure you are improving.  Return to the emergency room if you change your mind and start having difficulty with your breathing or high fevers

## 2019-08-24 NOTE — ED Provider Notes (Addendum)
[facility] DEPT Provider Note   CSN: [id number] Arrival date & time: 08/24/19  1214     History   Chief Complaint Chief Complaint  Patient presents with  . Shortness of Breath  . Chills    HPI Jacqueline Dodson is a 85 y.o. female.     HPI  Pt started having sx about a week ago.  She has noticed shortness of breath when walking too fast.  At rest she is doing OK.  She did have a couple of episodes have also been at night.  She also felt a low grade fever over the wwkend.  Right now she is doing OK.  Possible covid exposure at the nursing facility.   Past Medical History:  Diagnosis Date  . Abnormality of gait   . Acute, but ill-defined, cerebrovascular disease   . Anxiety   . Arthritis   . Chronic kidney disease, stage II (mild)   . Cystocele, midline   . Dermatophytosis of the body   . Disturbance of skin sensation   . Dizziness and giddiness   . Duodenal ulcer, unspecified as acute or chronic, without hemorrhage, perforation, or obstruction   . Edema   . Elevated blood pressure reading without diagnosis of hypertension   . GERD (gastroesophageal reflux disease)   . Hemorrhoids   . Hypertension   . Left bundle branch hemiblock   . Lumbago   . Macular degeneration (senile) of retina, unspecified   . Myalgia and myositis, unspecified   . Orthostatic hypotension   . Other abnormal blood chemistry   . Other malaise and fatigue   . Personal history of fall   . Polyneuropathy in other diseases classified elsewhere ([place]) 02/16/2014  . Postmenopausal atrophic vaginitis   . 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 Rectocele   . Restless legs syndrome (RLS)   . Unspecified constipation   . Unspecified hereditary and idiopathic peripheral neuropathy   . Urgency of urination     Patient Active Problem List   Diagnosis Date Noted  . Type 2 diabetes mellitus with diabetic chronic kidney disease ([place]) 12/25/2018  . Diarrhea 12/25/2018  . Vitamin  D deficiency 12/01/2018  . Hyperlipidemia associated with type 2 diabetes mellitus ([place]) 08/28/2017  . 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 Chronic kidney disease, stage III (moderate) 05/31/2016  . Incontinent of urine 12/29/2015  . Bowel incontinence 12/29/2015  . Lightheadedness 03/27/2015  . Dysphagia 12/16/2014  . GERD (gastroesophageal reflux disease) 09/09/2014  . Edema 09/09/2014  . Polyneuropathy in other diseases classified elsewhere ([place]) 02/16/2014  . Abnormality of gait 02/16/2014  . Restless legs syndrome (RLS) 02/16/2014  . Constipation 12/20/2013  . Peripheral neuropathy 12/20/2013  . Rectal mucosa prolapse 04/13/2013  . Osteoarthritis of right knee 01/15/2013  . Osteoarthritis of right hip 01/15/2013  . HTN (hypertension) 01/15/2013  . Insomnia 01/15/2013  . Generalized anxiety disorder 01/15/2013  . Cystocele, unspecified (CODE) 01/15/2013    Past Surgical History:  Procedure Laterality Date  . ABDOMINAL HYSTERECTOMY  1972  . APPENDECTOMY  1945  . CATARACT EXTRACTION Bilateral   . EXTERNAL EAR SURGERY  2010   left outer ear basel cell removed  . TONSILLECTOMY  1940     OB History   No obstetric history on file.      Home Medications    Prior to Admission medications   Medication Sig Start Date End Date Taking? Authorizing Provider  acetaminophen (  TYLENOL) 500 MG tablet Take 500 mg by mouth as needed.    [provider]  calcium carbonate (TUMS - DOSED IN MG ELEMENTAL CALCIUM) 500 MG chewable tablet Chew 1 tablet by mouth as needed for indigestion or heartburn.     [provider]  cholecalciferol (VITAMIN D) 25 MCG (1000 UT) tablet Take 1,000 Units by mouth daily.    [provider]  gabapentin (NEURONTIN) 100 MG capsule TAKE 1 CAPSULE TWICE DAILY 06/01/19   Ward Givens, NP  labetalol (NORMODYNE) 100 MG tablet TAKE 1 TABLET TWICE A DAY 08/20/19   Virgie Dad, MD   levofloxacin (LEVAQUIN) 250 MG tablet Take 1.5 tablets (375 mg total) by mouth daily for 7 days. 08/24/19 08/31/19  Dorie Rank, MD  LORazepam (ATIVAN) 0.5 MG tablet Take 1 tablet (0.5 mg total) by mouth at bedtime. 08/03/19   Mast, Man X, NP  Multiple Vitamins-Minerals (MULTIVITAMIN WITH MINERALS) tablet Take 1 tablet by mouth daily.    [provider]  Multiple Vitamins-Minerals (PRESERVISION AREDS 2 PO) Take 1 tablet by mouth daily.    [provider]  NIFEdipine (PROCARDIA XL/NIFEDICAL XL) 60 MG 24 hr tablet TAKE 1 TABLET DAILY FOR    BLOOD PRESSURE 07/13/19   Mast, Man X, NP  nitrofurantoin, macrocrystal-monohydrate, (MACROBID) 100 MG capsule Take 100 mg by mouth daily.    [provider]  omeprazole (PRILOSEC OTC) 20 MG tablet Take 20 mg by mouth every other day.     [provider]  Probiotic Product (PROBIOTIC ADVANCED PO) Take by mouth.    [provider]  telmisartan (MICARDIS) 80 MG tablet TAKE 1 TABLET DAILY FOR    BLOOD PRESSURE 08/24/19   Mast, Man X, NP    Family History Family History  Problem Relation Age of Onset  . Aortic stenosis Mother   . Heart failure Mother   . Thrombosis Father   . Coronary artery disease Father   . Ataxia Sister   . Ataxia Brother   . Heart disease Brother   . Cancer Brother        Prostate  . Stroke Brother     Social History Social History   Tobacco Use  . Smoking status: Never Smoker  . Smokeless tobacco: Never Used  Substance Use Topics  . Alcohol use: No    Alcohol/week: 0.0 standard drinks  . Drug use: No     Allergies   Amlodipine, Enalapril, Hctz [hydrochlorothiazide], Penicillins, and Amoxicillin   Review of Systems Review of Systems  Constitutional: Positive for fever.  HENT:       No change in smell  Respiratory: Positive for cough ( chronic cough, slight increase on friday) and shortness of breath.   Gastrointestinal: Negative for abdominal pain.       No change in  taste   Genitourinary: Negative for dysuria.  Skin: Negative for rash.  Neurological: Negative for headaches.  All other systems reviewed and are negative.    Physical Exam Updated Vital Signs BP 135/67   Pulse 75   Temp 98 F (36.7 C) (Oral)   Resp (!) 30   SpO2 96%   Physical Exam Vitals signs and nursing note reviewed.  Constitutional:      General: She is not in acute distress.    Appearance: She is well-developed.  HENT:     Head: Normocephalic and atraumatic.     Right Ear: External ear normal.     Left Ear: External ear normal.  Eyes:     General: No scleral icterus.       Right eye: No discharge.        Left eye: No discharge.     Conjunctiva/sclera: Conjunctivae normal.  Neck:     Musculoskeletal: Neck supple.     Trachea: No tracheal deviation.  Cardiovascular:     Rate and Rhythm: Normal rate and regular rhythm.  Pulmonary:     Effort: Pulmonary effort is normal. No respiratory distress.     Breath sounds: Normal breath sounds. No stridor. No wheezing or rales.  Abdominal:     General: Bowel sounds are normal. There is no distension.     Palpations: Abdomen is soft.     Tenderness: There is no abdominal tenderness. There is no guarding or rebound.  Musculoskeletal:        General: No tenderness.  Skin:    General: Skin is warm and dry.     Findings: No rash.  Neurological:     Mental Status: She is alert.     Cranial Nerves: No cranial nerve deficit (no facial droop, extraocular movements intact, no slurred speech).     Sensory: No sensory deficit.     Motor: No abnormal muscle tone or seizure activity.     Coordination: Coordination normal.      ED Treatments / Results  Labs (all labs ordered are listed, but only abnormal results are displayed) Labs Reviewed  CBC - Abnormal; Notable for the following components:      Result Value   RBC 3.59 (*)    Hemoglobin 10.7 (*)    HCT 32.5 (*)    All other components within normal limits  BASIC  METABOLIC PANEL - Abnormal; Notable for the following components:   CO2 21 (*)    Glucose, Bld 106 (*)    BUN 30 (*)    Creatinine, Ser 1.69 (*)    GFR calc non Af Amer 26 (*)    GFR calc Af Amer 30 (*)    All other components within normal limits  BRAIN NATRIURETIC PEPTIDE - Abnormal; Notable for the following components:   B Natriuretic Peptide 512.9 (*)    All other components within normal limits  SARS CORONAVIRUS 2 (TAT 6-24 HRS)  TROPONIN I (HIGH SENSITIVITY)    EKG EKG Interpretation  Date/Time:  Monday August 24 2019 13:12:47 EST Ventricular Rate:  86 PR Interval:    QRS Duration: 142 QT Interval:  462 QTC Calculation: 492 R Axis:   44 Text Interpretation: Sinus rhythm Multiform ventricular premature complexes Left bundle branch block No significant change since last tracing Confirmed by Dorie Rank [phone]) on 08/24/2019 1:45:32 PM   Radiology Dg Chest Portable 1 View  Result Date: 08/24/2019 CLINICAL DATA:  Shortness of breath EXAM: PORTABLE CHEST 1 VIEW COMPARISON:  None. FINDINGS: There are small pleural effusions bilaterally. There is consolidation in the left base. The lungs elsewhere are clear. Heart is upper normal in size with pulmonary vascularity normal. No adenopathy. There is aortic atherosclerosis. No bone lesions. IMPRESSION: Consolidation left base consistent with pneumonia. Small pleural effusions bilaterally. Lungs elsewhere clear. Heart upper normal in size. Aortic Atherosclerosis (ICD10-I70.0). Electronically Signed   By: Lowella Grip III M.D.   On: 08/24/2019 14:10    Procedures Procedures (including critical care time)  Medications Ordered in ED Medications  levofloxacin (LEVAQUIN) IVPB 750 mg (750 mg Intravenous New Bag/Given (Non-Interop) 08/24/19 1636)     Initial Impression / Assessment and Plan /  ED Course  I have reviewed the triage vital signs and the nursing notes.  Pertinent labs & imaging results that were available during my  care of the patient were reviewed by me and considered in my medical decision making (see chart for details).  Clinical Course as of Aug 23 1726  Mon Aug 24, 2019  1541 BNP elevated.   [VQ]  2595 Chest x-ray consistent with pneumonia.   [JK]  1542 BUN and creatinine are chronically elevated.   [GL]  8756 Discussed admission to the hospital because of the patient's pneumonia.  Of note, the respiratory rate of 30 documented at 4:30 PM is inaccurate.  Patient has not been tachypneic on my exam.  Offered admission.  Patient states she is feeling well and would like to go home.  She discussed   [EP]  3295  with her daughter and they are comfortable with going home.   [JK]    Clinical Course User Index [JK] Dorie Rank, MD     Patient presented with complaints of cough.  X-ray shows possible pneumonia.  Patient's laboratory tests do not show any evidence of elevated white blood cell count.  Patient's vital signs remained stable.  She is afebrile normotensive.  I discussed admission to the hospital because of her age and the finding of pneumonia.  Patient states she is feeling well and would like to be treated at home.  She is not hypotensive does not require oxygen so I do think this is reasonable.  Patient discussed this with her daughter and she is comfortable with discharge.  I will send her home on a course of Levaquin despite the risks at her age because of her penicillin allergy.  Jacqueline Dodson was evaluated in Emergency Department on 08/24/2019 for the symptoms described in the history of present illness. She was evaluated in the context of the global COVID-19 pandemic, which necessitated consideration that the patient might be at risk for infection with the SARS-CoV-2 virus that causes COVID-19. Institutional protocols and algorithms that pertain to the evaluation of patients at risk for COVID-19 are in a state of rapid change based on information released by regulatory bodies including the CDC and  federal and state organizations. These policies and algorithms were followed during the patient's care in the ED.   Final Clinical Impressions(s) / ED Diagnoses   Final diagnoses:  Community acquired pneumonia, unspecified laterality    ED Discharge Orders         Ordered    levofloxacin (LEVAQUIN) 250 MG tablet  Daily     08/24/19 1725           Dorie Rank, MD 08/24/19 1727    Dorie Rank, MD 08/24/19 1727

## 2019-08-24 NOTE — Telephone Encounter (Signed)
Rx pended and sent to [facility] for approval due to [place].

## 2019-08-24 NOTE — ED Triage Notes (Signed)
Pt reports for over week ago was having SOB with ambulation. Friday night had chills, fever of 100, took tylenol and was fine on Saturday. Reports last night had trouble sleeping again.  Pt reports that a resident on her floor had a wife who is in skilled side was positive for Covid. So the entire floor had to quarantine until the resident's test results came back negative. Reports that was around 10 days ago.

## 2019-08-24 NOTE — ED Notes (Signed)
ED Provider at bedside. 

## 2019-09-01 ENCOUNTER — Other Ambulatory Visit: Payer: Self-pay | Admitting: *Deleted

## 2019-09-01 DIAGNOSIS — F419 Anxiety disorder, unspecified: Secondary | ICD-10-CM

## 2019-09-01 MED ORDER — LORAZEPAM 0.5 MG PO TABS: 0.5000 mg | ORAL_TABLET | Freq: Every day | ORAL | 0 refills | Status: DC

## 2019-09-01 NOTE — Telephone Encounter (Signed)
Patient requested refill Pended and sent to [facility] for approval.

## 2019-09-21 DIAGNOSIS — N184 Chronic kidney disease, stage 4 (severe): Secondary | ICD-10-CM | POA: Diagnosis not present

## 2019-09-21 DIAGNOSIS — I129 Hypertensive chronic kidney disease with stage 1 through stage 4 chronic kidney disease, or unspecified chronic kidney disease: Secondary | ICD-10-CM | POA: Diagnosis not present

## 2019-09-21 DIAGNOSIS — G609 Hereditary and idiopathic neuropathy, unspecified: Secondary | ICD-10-CM | POA: Diagnosis not present

## 2019-09-21 DIAGNOSIS — D631 Anemia in chronic kidney disease: Secondary | ICD-10-CM | POA: Diagnosis not present

## 2019-09-22 ENCOUNTER — Other Ambulatory Visit: Payer: Self-pay

## 2019-09-22 DIAGNOSIS — G609 Hereditary and idiopathic neuropathy, unspecified: Secondary | ICD-10-CM

## 2019-09-22 DIAGNOSIS — N184 Chronic kidney disease, stage 4 (severe): Secondary | ICD-10-CM

## 2019-09-22 DIAGNOSIS — I129 Hypertensive chronic kidney disease with stage 1 through stage 4 chronic kidney disease, or unspecified chronic kidney disease: Secondary | ICD-10-CM

## 2019-09-22 DIAGNOSIS — D631 Anemia in chronic kidney disease: Secondary | ICD-10-CM

## 2019-09-23 LAB — CBC WITH DIFFERENTIAL/PLATELET
Absolute Monocytes: 456 cells/uL (ref 200–950)
Basophils Absolute: 30 cells/uL (ref 0–200)
Basophils Relative: 0.7 %
Eosinophils Absolute: 305 cells/uL (ref 15–500)
Eosinophils Relative: 7.1 %
HCT: 32.3 % — ABNORMAL LOW (ref 35.0–45.0)
Hemoglobin: 10.7 g/dL — ABNORMAL LOW (ref 11.7–15.5)
Lymphs Abs: 1974 cells/uL (ref 850–3900)
MCH: 29.6 pg (ref 27.0–33.0)
MCHC: 33.1 g/dL (ref 32.0–36.0)
MCV: 89.5 fL (ref 80.0–100.0)
MPV: 10 fL (ref 7.5–12.5)
Monocytes Relative: 10.6 %
Neutro Abs: 1535 cells/uL (ref 1500–7800)
Neutrophils Relative %: 35.7 %
Platelets: 147 10*3/uL (ref 140–400)
RBC: 3.61 10*6/uL — ABNORMAL LOW (ref 3.80–5.10)
RDW: 12.6 % (ref 11.0–15.0)
Total Lymphocyte: 45.9 %
WBC: 4.3 10*3/uL (ref 3.8–10.8)

## 2019-09-23 LAB — IRON, TOTAL/TOTAL IRON BINDING CAP
%SAT: 20 % (calc) (ref 16–45)
Iron: 58 ug/dL (ref 45–160)
TIBC: 295 mcg/dL (calc) (ref 250–450)

## 2019-09-23 LAB — VITAMIN B12: Vitamin B-12: 427 pg/mL (ref 200–1100)

## 2019-09-23 LAB — FERRITIN: Ferritin: 69 ng/mL (ref 16–288)

## 2019-09-24 ENCOUNTER — Other Ambulatory Visit: Payer: Self-pay

## 2019-09-24 ENCOUNTER — Non-Acute Institutional Stay: Payer: Medicare Other | Admitting: Nurse Practitioner

## 2019-09-24 ENCOUNTER — Encounter: Payer: Self-pay | Admitting: Nurse Practitioner

## 2019-09-24 DIAGNOSIS — N184 Chronic kidney disease, stage 4 (severe): Secondary | ICD-10-CM

## 2019-09-24 DIAGNOSIS — K5904 Chronic idiopathic constipation: Secondary | ICD-10-CM

## 2019-09-24 DIAGNOSIS — F411 Generalized anxiety disorder: Secondary | ICD-10-CM

## 2019-09-24 DIAGNOSIS — K219 Gastro-esophageal reflux disease without esophagitis: Secondary | ICD-10-CM

## 2019-09-24 DIAGNOSIS — G63 Polyneuropathy in diseases classified elsewhere: Secondary | ICD-10-CM

## 2019-09-24 DIAGNOSIS — I10 Essential (primary) hypertension: Secondary | ICD-10-CM | POA: Diagnosis not present

## 2019-09-24 DIAGNOSIS — E1122 Type 2 diabetes mellitus with diabetic chronic kidney disease: Secondary | ICD-10-CM

## 2019-09-24 DIAGNOSIS — J189 Pneumonia, unspecified organism: Secondary | ICD-10-CM | POA: Insufficient documentation

## 2019-09-24 NOTE — Assessment & Plan Note (Addendum)
eGFR 27 08/13/19, Hgb 10.7 Iron 58, Vit B12 427 09/21/19

## 2019-09-24 NOTE — Assessment & Plan Note (Signed)
Stable, continue Omeprazole 20mg qd.  

## 2019-09-24 NOTE — Assessment & Plan Note (Signed)
Stable, continue Lorazepam 0.5mg  hs.

## 2019-09-24 NOTE — Assessment & Plan Note (Signed)
Diety controlled, will delay Foot and Eye exam due to [place] pandemic.

## 2019-09-24 NOTE — Assessment & Plan Note (Signed)
Stable, continue Gabapentin 100mg  bid, prn Tylenol.

## 2019-09-24 NOTE — Assessment & Plan Note (Signed)
ED 08/24/19 PNA, CXR showed left base pneumonia, bilateral effusion, treated with Levoquin 375mg  qd x 7days in setting of c/o chills, SOB, age, retirement community. The patient denied cough, SOB, chills, fatigue, chest pain/pressure, palpitation.

## 2019-09-24 NOTE — Assessment & Plan Note (Signed)
Blood pressure is controlled, continue Telmisartan 80mg  qd, Nifedipine 60mg  qd, Labetelol 100mg  bid.

## 2019-09-24 NOTE — Progress Notes (Signed)
Location:   clinic [place]   Place of Service:  Clinic (12) Provider: Marlana Latus NP  Code Status: DNR Goals of Care: [place] 08/24/2019  Does Patient Have a Medical Advance Directive? Yes  Type of Paramedic of [place];Living will  Does patient want to make changes to medical advance directive? -  Copy of [place] in Chart? No - copy requested  Would patient like information on creating a medical advance directive? -     Chief Complaint  Patient presents with  . Medical Management of Chronic Issues    Patient returns for her 5 week follow up on SOB. She would also like to discuss her BMs and Pneumonia. Foot and eye exam.     HPI: Patient is a 85 y.o. female seen today for medical management of chronic diseases.    The patient has history of HTN, blood pressure is controlled on Telmisartan '80mg'$  qd, Nifedipine '60mg'$  qd, Labetalol '100mg'$  bid. Peripheral neuropathic pain, managed, taking Gabapentin '100mg'$  bid, Tylenol prn. Her mood is stable, on Lorazepam 0.'5mg'$  qhs. She is off  Nitrofurantoin '100mg'$  qd for UTI suppression therapy. GERD, stable, on Omeprazole '20mg'$  qod.   CKD Bun/creat 30/1.69 08/24/19 at her baseline, Hgb 10.7 09/21/19, Iron 58, Vit B12 427.   ED 08/24/19 PNA, CXR showed left base pneumonia, bilateral effusion, treated with Levoquin '375mg'$  qd x 7days in setting of c/o chills, SOB, age, retirement community. The patient denied cough, SOB, chills, fatigue, chest pain/pressure, palpitation.    Past Medical History:  Diagnosis Date  . Abnormality of gait   . Acute, but ill-defined, cerebrovascular disease   . Anxiety   . Arthritis   . Chronic kidney disease, stage II (mild)   . Cystocele, midline   . Dermatophytosis of the body   . Disturbance of skin sensation   . Dizziness and giddiness   . Duodenal ulcer, unspecified as acute or chronic, without hemorrhage, perforation, or obstruction   . Edema   . Elevated  blood pressure reading without diagnosis of hypertension   . GERD (gastroesophageal reflux disease)   . Hemorrhoids   . Hypertension   . Left bundle branch hemiblock   . Lumbago   . Macular degeneration (senile) of retina, unspecified   . Myalgia and myositis, unspecified   . Orthostatic hypotension   . Other abnormal blood chemistry   . Other malaise and fatigue   . Personal history of fall   . Polyneuropathy in other diseases classified elsewhere ([place]) 02/16/2014  . Postmenopausal atrophic vaginitis   . 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 Rectocele   . Restless legs syndrome (RLS)   . Unspecified constipation   . Unspecified hereditary and idiopathic peripheral neuropathy   . Urgency of urination     Past Surgical History:  Procedure Laterality Date  . ABDOMINAL HYSTERECTOMY  1972  . APPENDECTOMY  1945  . CATARACT EXTRACTION Bilateral   . EXTERNAL EAR SURGERY  2010   left outer ear basel cell removed  . TONSILLECTOMY  1940    Allergies  Allergen Reactions  . Amlodipine Other (See Comments)    Reaction:  Critically decreased sodium and potassium levels.    . Enalapril Other (See Comments)    Reaction:  Unknown   . Hctz [Hydrochlorothiazide] Other (See Comments)    Reaction:  Unknown   . Penicillins Other (See Comments)    Reaction:  Unknown   . Amoxicillin Swelling and Rash  Allergies as of 09/24/2019      Reactions   Amlodipine Other (See Comments)   Reaction:  Critically decreased sodium and potassium levels.     Enalapril Other (See Comments)   Reaction:  Unknown    Hctz [hydrochlorothiazide] Other (See Comments)   Reaction:  Unknown    Penicillins Other (See Comments)   Reaction:  Unknown    Amoxicillin Swelling, Rash      Medication List       Accurate as of September 24, 2019 11:59 PM. If you have any questions, ask your nurse or doctor.        STOP taking these medications   nitrofurantoin (macrocrystal-monohydrate) 100 MG capsule  Commonly known as: MACROBID Stopped by: Chasten Blaze X Yuliana Vandrunen, NP     TAKE these medications   acetaminophen 500 MG tablet Commonly known as: TYLENOL Take 500 mg by mouth as needed.   calcium carbonate 500 MG chewable tablet Commonly known as: TUMS - dosed in mg elemental calcium Chew 1 tablet by mouth as needed for indigestion or heartburn.   cholecalciferol 25 MCG (1000 UT) tablet Commonly known as: VITAMIN D Take 1,000 Units by mouth daily.   gabapentin 100 MG capsule Commonly known as: NEURONTIN TAKE 1 CAPSULE TWICE DAILY   labetalol 100 MG tablet Commonly known as: NORMODYNE TAKE 1 TABLET TWICE A DAY   LORazepam 0.5 MG tablet Commonly known as: ATIVAN Take 1 tablet (0.5 mg total) by mouth at bedtime.   multivitamin with minerals tablet Take 1 tablet by mouth daily.   PRESERVISION AREDS 2 PO Take 1 tablet by mouth daily.   NIFEdipine 60 MG 24 hr tablet Commonly known as: PROCARDIA XL/NIFEDICAL XL TAKE 1 TABLET DAILY FOR    BLOOD PRESSURE   omeprazole 20 MG tablet Commonly known as: PRILOSEC OTC Take 20 mg by mouth every other day.   PROBIOTIC ADVANCED PO Take by mouth.   telmisartan 80 MG tablet Commonly known as: MICARDIS TAKE 1 TABLET DAILY FOR    BLOOD PRESSURE       Review of Systems:  Review of Systems  Constitutional: Negative for activity change, appetite change, chills, diaphoresis, fatigue and fever.  HENT: Positive for hearing loss. Negative for congestion and voice change.   Eyes: Negative for visual disturbance.  Respiratory: Negative for cough, shortness of breath and wheezing.   Cardiovascular: Negative for chest pain, palpitations and leg swelling.  Gastrointestinal: Negative for abdominal distention, abdominal pain, constipation, diarrhea, nausea and vomiting.       On and off constipation/diarrhea, usually diarrhea after laxative.   Genitourinary: Negative for difficulty urinating, dysuria and urgency.  Musculoskeletal: Positive for gait  problem.  Skin: Negative for color change and pallor.  Neurological: Negative for dizziness, speech difficulty, weakness and headaches.  Psychiatric/Behavioral: Negative for agitation, behavioral problems, hallucinations and sleep disturbance. The patient is not nervous/anxious.     Health Maintenance  Topic Date Due  . FOOT EXAM  05/05/1937  . OPHTHALMOLOGY EXAM  05/05/1937  . HEMOGLOBIN A1C  02/15/2020  . TETANUS/TDAP  09/04/2027  . INFLUENZA VACCINE  Completed  . PNA vac Low Risk Adult  Completed  . DEXA SCAN  Discontinued    Physical Exam: Vitals:   09/24/19 1455  BP: (!) 142/66  Pulse: 68  Temp: (!) 97.1 F (36.2 C)  SpO2: 97%  Weight: 144 lb 12.8 oz (65.7 kg)  Height: '5\' 6"'$  (1.676 m)   Body mass index is 23.37 kg/m. Physical Exam Vitals and nursing  note reviewed.  Constitutional:      General: She is not in acute distress.    Appearance: Normal appearance. She is not ill-appearing, toxic-appearing or diaphoretic.  HENT:     Head: Normocephalic and atraumatic.     Nose: Nose normal.     Mouth/Throat:     Mouth: Mucous membranes are moist.  Eyes:     Extraocular Movements: Extraocular movements intact.     Conjunctiva/sclera: Conjunctivae normal.     Pupils: Pupils are equal, round, and reactive to light.  Cardiovascular:     Rate and Rhythm: Normal rate and regular rhythm.     Heart sounds: Murmur present.  Pulmonary:     Breath sounds: No wheezing, rhonchi or rales.  Abdominal:     General: Bowel sounds are normal. There is no distension.     Palpations: Abdomen is soft.     Tenderness: There is no abdominal tenderness. There is no right CVA tenderness, left CVA tenderness, guarding or rebound.  Musculoskeletal:     Cervical back: Normal range of motion and neck supple.     Right lower leg: No edema.     Left lower leg: No edema.  Skin:    General: Skin is warm and dry.  Neurological:     General: No focal deficit present.     Mental Status: She is  alert and oriented to person, place, and time. Mental status is at baseline.     Motor: No weakness.     Coordination: Coordination normal.     Gait: Gait abnormal.  Psychiatric:        Mood and Affect: Mood normal.        Behavior: Behavior normal.        Thought Content: Thought content normal.        Judgment: Judgment normal.     Labs reviewed: Basic Metabolic Panel: Recent Labs    10/07/18 0000 10/07/18 0000 11/27/18 0000 01/14/19 0700 08/18/19 0705 08/24/19 1425  NA 140   < > 141 139 143 138  K 4.1  --  4.3 4.3 3.9 3.9  CL 106   < > 108 105 107 109  CO2 24   < > '24 25 25 '$ 21*  GLUCOSE 108*   < >  --  105* 106* 106*  BUN 29*   < > 32* 29* 36* 30*  CREATININE 1.64*   < > 1.6* 1.50* 1.64* 1.69*  CALCIUM 9.3   < > 9.4 9.2 9.1 9.2  MG  --   --  2.1  --   --   --   PHOS  --   --  4.2  --   --   --   TSH 3.83  --   --   --   --   --    < > = values in this interval not displayed.   Liver Function Tests: Recent Labs    11/27/18 0000 12/29/18 0000 01/14/19 0700 08/18/19 0705  AST  --  '16 18 16  '$ ALT  --  '14 15 12  '$ BILITOT  --  0.3 0.3 0.4  PROT  --  6.5 6.5 6.5  ALBUMIN 4.0  --   --   --    No results for input(s): LIPASE, AMYLASE in the last 8760 hours. No results for input(s): AMMONIA in the last 8760 hours. CBC: Recent Labs    12/29/18 0000 08/18/19 0705 08/24/19 1425 09/21/19 1225  WBC 5.9 6.2 9.9 4.3  NEUTROABS 2,749 3,522  --  1,535  HGB 11.3* 10.7* 10.7* 10.7*  HCT 33.6* 31.6* 32.5* 32.3*  MCV 89.4 89.5 90.5 89.5  PLT 245 177 232 147   Lipid Panel: Recent Labs    10/07/18 0000 08/18/19 0705  CHOL 238* 213*  HDL 34* 34*  LDLCALC 166* 150*  TRIG 227* 155*  CHOLHDL 7.0* 6.3*   Lab Results  Component Value Date   HGBA1C 5.5 08/18/2019    Procedures since last visit: No results found.  Assessment/Plan  HTN (hypertension) Blood pressure is controlled, continue Telmisartan '80mg'$  qd, Nifedipine '60mg'$  qd, Labetelol '100mg'$  bid.   Type 2  diabetes mellitus with diabetic chronic kidney disease ([place]) Diety controlled, will delay Foot and Eye exam due to [place] pandemic.   Peripheral neuropathy Stable, continue Gabapentin '100mg'$  bid, prn Tylenol.   CKD (chronic kidney disease) stage 4, GFR 15-29 ml/min (HCC) eGFR 27 08/13/19, Hgb 10.7 Iron 58, Vit B12 427 09/21/19  Constipation Senokot caused diarrhea, last BM yesterday, she denied abd pain, nausea, vomiting, s/p ABT 7 day course of Levaquin '375mg'$  qd x 7 days 3 weeks ago. Encourage fluid and dietary fiber use. Diet as tolerated. Observe.   Generalized anxiety disorder Stable, continue Lorazepam 0.'5mg'$  hs.   GERD (gastroesophageal reflux disease) Stable, continue Omeprazole '20mg'$  qd.   Pneumonia ED 08/24/19 PNA, CXR showed left base pneumonia, bilateral effusion, treated with Levoquin '375mg'$  qd x 7days in setting of c/o chills, SOB, age, retirement community. The patient denied cough, SOB, chills, fatigue, chest pain/pressure, palpitation.      Labs/tests ordered:  None  Next appt:  4 months.

## 2019-09-24 NOTE — Patient Instructions (Signed)
F/u in clinic [place] in 4 months, may delay Eye and Foot exam due to the [employer] pandemic.

## 2019-09-24 NOTE — Assessment & Plan Note (Signed)
Senokot caused diarrhea, last BM yesterday, she denied abd pain, nausea, vomiting, s/p ABT 7 day course of Levaquin 375mg  qd x 7 days 3 weeks ago. Encourage fluid and dietary fiber use. Diet as tolerated. Observe.

## 2019-09-25 ENCOUNTER — Encounter: Payer: Self-pay | Admitting: Nurse Practitioner

## 2019-10-05 ENCOUNTER — Other Ambulatory Visit: Payer: Self-pay | Admitting: *Deleted

## 2019-10-05 DIAGNOSIS — F419 Anxiety disorder, unspecified: Secondary | ICD-10-CM

## 2019-10-05 MED ORDER — LORAZEPAM 0.5 MG PO TABS: 0.5000 mg | ORAL_TABLET | Freq: Every day | ORAL | 0 refills | Status: DC

## 2019-10-05 NOTE — Telephone Encounter (Signed)
Patient requested Jacqueline Dodson Verified LR: 09/01/2019 Pended Rx and sent to [facility] for approval.

## 2019-10-12 DIAGNOSIS — Z23 Encounter for immunization: Secondary | ICD-10-CM | POA: Diagnosis not present

## 2019-10-15 ENCOUNTER — Encounter: Payer: Self-pay | Admitting: Nurse Practitioner

## 2019-10-15 DIAGNOSIS — D638 Anemia in other chronic diseases classified elsewhere: Secondary | ICD-10-CM | POA: Insufficient documentation

## 2019-11-03 ENCOUNTER — Other Ambulatory Visit: Payer: Self-pay

## 2019-11-03 DIAGNOSIS — F419 Anxiety disorder, unspecified: Secondary | ICD-10-CM

## 2019-11-03 MED ORDER — LORAZEPAM 0.5 MG PO TABS: 0.5000 mg | ORAL_TABLET | Freq: Every day | ORAL | 0 refills | Status: DC

## 2019-11-03 NOTE — Telephone Encounter (Signed)
Patient aware appointment needed to update non opioid treatment agreement. Appointment scheduled for 11/05/2019.   RX last filled 10/04/20

## 2019-11-05 ENCOUNTER — Encounter: Payer: Self-pay | Admitting: Nurse Practitioner

## 2019-11-05 ENCOUNTER — Non-Acute Institutional Stay: Payer: Medicare Other | Admitting: Nurse Practitioner

## 2019-11-05 ENCOUNTER — Other Ambulatory Visit: Payer: Self-pay

## 2019-11-05 DIAGNOSIS — I10 Essential (primary) hypertension: Secondary | ICD-10-CM

## 2019-11-05 DIAGNOSIS — K5904 Chronic idiopathic constipation: Secondary | ICD-10-CM | POA: Diagnosis not present

## 2019-11-05 DIAGNOSIS — E1122 Type 2 diabetes mellitus with diabetic chronic kidney disease: Secondary | ICD-10-CM | POA: Diagnosis not present

## 2019-11-05 DIAGNOSIS — K219 Gastro-esophageal reflux disease without esophagitis: Secondary | ICD-10-CM | POA: Diagnosis not present

## 2019-11-05 DIAGNOSIS — F411 Generalized anxiety disorder: Secondary | ICD-10-CM | POA: Diagnosis not present

## 2019-11-05 DIAGNOSIS — G63 Polyneuropathy in diseases classified elsewhere: Secondary | ICD-10-CM

## 2019-11-05 MED ORDER — LACTULOSE 10 GM/15ML PO SOLN: 10.0000 g | Freq: Every day | ORAL | 2 refills | Status: AC

## 2019-11-05 NOTE — Assessment & Plan Note (Addendum)
Worse in pm, stabbing pain rather than restlessness,  f/u Neurology in one month, continue Gabapentin

## 2019-11-05 NOTE — Assessment & Plan Note (Signed)
Lactulose daily,

## 2019-11-05 NOTE — Progress Notes (Signed)
Location:    clinic [place]   Place of Service:  Clinic (12) Provider: Marlana Latus NP  Code Status: DNR Goals of Care: [place] 08/24/2019  Does Patient Have a Medical Advance Directive? Yes  Type of Paramedic of [place];Living will  Does patient want to make changes to medical advance directive? -  Copy of [place] in Chart? No - copy requested  Would patient like information on creating a medical advance directive? -     Chief Complaint  Patient presents with  . Medical Management of Chronic Issues    Medication review and opioid contact  . Health Maintenance    Foot and eye exam    HPI: Patient is a 86 y.o. female seen today for medical management of chronic diseases.    The patient has history of peripheral neuropathy, restless legs, some stabbing pain noted, Gabapentin helps, under Neurology care. T2DM, diet controlled. HTN, blood pressure is controlled on Labetalol 100mg  bid, Nifedipine 60mg  qd, Telmisartan 80mg  qd. GERD, stable, on Omeprazole 20mg  qod.  Anxiety/insomnia, taking Lorazepam nightly.    Past Medical History:  Diagnosis Date  . Abnormality of gait   . Acute, but ill-defined, cerebrovascular disease   . Anxiety   . Arthritis   . Chronic kidney disease, stage II (mild)   . Cystocele, midline   . Dermatophytosis of the body   . Disturbance of skin sensation   . Dizziness and giddiness   . Duodenal ulcer, unspecified as acute or chronic, without hemorrhage, perforation, or obstruction   . Edema   . Elevated blood pressure reading without diagnosis of hypertension   . GERD (gastroesophageal reflux disease)   . Hemorrhoids   . Hypertension   . Left bundle branch hemiblock   . Lumbago   . Macular degeneration (senile) of retina, unspecified   . Myalgia and myositis, unspecified   . Orthostatic hypotension   . Other abnormal blood chemistry   . Other malaise and fatigue   . Personal history of  fall   . Polyneuropathy in other diseases classified elsewhere ([place]) 02/16/2014  . Postmenopausal atrophic vaginitis   . 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 Rectocele   . Restless legs syndrome (RLS)   . Unspecified constipation   . Unspecified hereditary and idiopathic peripheral neuropathy   . Urgency of urination     Past Surgical History:  Procedure Laterality Date  . ABDOMINAL HYSTERECTOMY  1972  . APPENDECTOMY  1945  . CATARACT EXTRACTION Bilateral   . EXTERNAL EAR SURGERY  2010   left outer ear basel cell removed  . TONSILLECTOMY  1940    Allergies  Allergen Reactions  . Amlodipine Other (See Comments)    Reaction:  Critically decreased sodium and potassium levels.    . Enalapril Other (See Comments)    Reaction:  Unknown   . Hctz [Hydrochlorothiazide] Other (See Comments)    Reaction:  Unknown   . Penicillins Other (See Comments)    Reaction:  Unknown   . Amoxicillin Swelling and Rash    Allergies as of 11/05/2019      Reactions   Amlodipine Other (See Comments)   Reaction:  Critically decreased sodium and potassium levels.     Enalapril Other (See Comments)   Reaction:  Unknown    Hctz [hydrochlorothiazide] Other (See Comments)   Reaction:  Unknown    Penicillins Other (See Comments)   Reaction:  Unknown    Amoxicillin Swelling, Rash  Medication List       Accurate as of November 05, 2019 11:59 PM. If you have any questions, ask your nurse or doctor.        acetaminophen 500 MG tablet Commonly known as: TYLENOL Take 500 mg by mouth as needed.   calcium carbonate 500 MG chewable tablet Commonly known as: TUMS - dosed in mg elemental calcium Chew 1 tablet by mouth as needed for indigestion or heartburn.   cholecalciferol 25 MCG (1000 UNIT) tablet Commonly known as: VITAMIN D Take 1,000 Units by mouth daily.   gabapentin 100 MG capsule Commonly known as: NEURONTIN TAKE 1 CAPSULE TWICE DAILY   labetalol 100 MG tablet Commonly known  as: NORMODYNE TAKE 1 TABLET TWICE A DAY   lactulose 10 GM/15ML solution Commonly known as: CHRONULAC Take 15 mLs (10 g total) by mouth daily for 16 days. Started by: Valoree Agent X Sarae Nicholes, NP   LORazepam 0.5 MG tablet Commonly known as: ATIVAN Take 1 tablet (0.5 mg total) by mouth at bedtime.   NIFEdipine 60 MG 24 hr tablet Commonly known as: PROCARDIA XL/NIFEDICAL XL TAKE 1 TABLET DAILY FOR    BLOOD PRESSURE   omeprazole 20 MG tablet Commonly known as: PRILOSEC OTC Take 20 mg by mouth every other day.   PRESERVISION AREDS 2 PO Take 1 tablet by mouth daily. What changed: Another medication with the same name was removed. Continue taking this medication, and follow the directions you see here. Changed by: Jearlene Bridwell X Blossie Raffel, NP   PROBIOTIC ADVANCED PO Take by mouth.   telmisartan 80 MG tablet Commonly known as: MICARDIS TAKE 1 TABLET DAILY FOR    BLOOD PRESSURE       Review of Systems:  Review of Systems  Constitutional: Negative for activity change, appetite change, chills, diaphoresis, fatigue and fever.  HENT: Positive for hearing loss. Negative for congestion and voice change.   Eyes: Negative for visual disturbance.  Respiratory: Negative for cough, shortness of breath and wheezing.   Cardiovascular: Positive for leg swelling. Negative for chest pain and palpitations.  Gastrointestinal: Positive for constipation and diarrhea. Negative for abdominal distention, abdominal pain and vomiting.       Alternating constipation, diarrhea if taking Laxative.   Genitourinary: Negative for difficulty urinating, dysuria and urgency.  Musculoskeletal: Positive for arthralgias and gait problem.  Skin: Negative for color change and pallor.  Neurological: Negative for dizziness, speech difficulty, weakness and headaches.  Psychiatric/Behavioral: Positive for sleep disturbance. Negative for agitation, behavioral problems and hallucinations. The patient is nervous/anxious.     Health Maintenance    Topic Date Due  . FOOT EXAM  05/05/1937  . OPHTHALMOLOGY EXAM  [birth year]05/1937  . HEMOGLOBIN A1C  02/15/2020  . TETANUS/TDAP  09/04/2027  . INFLUENZA VACCINE  Completed  . PNA vac Low Risk Adult  Completed  . DEXA SCAN  Discontinued    Physical Exam: Vitals:   11/05/19 1523  BP: 132/66  Pulse: 68  Temp: (!) 96.9 F (36.1 C)  SpO2: 97%  Weight: 142 lb 12.8 oz (64.8 kg)  Height: 5\' 6"  (1.676 m)   Body mass index is 23.05 kg/m. Physical Exam Vitals and nursing note reviewed.  Constitutional:      General: She is not in acute distress.    Appearance: Normal appearance. She is normal weight. She is not ill-appearing, toxic-appearing or diaphoretic.  HENT:     Head: Normocephalic and atraumatic.     Nose: Nose normal.     Mouth/Throat:  Mouth: Mucous membranes are moist.  Eyes:     Extraocular Movements: Extraocular movements intact.     Conjunctiva/sclera: Conjunctivae normal.     Pupils: Pupils are equal, round, and reactive to light.  Cardiovascular:     Rate and Rhythm: Normal rate and regular rhythm.     Heart sounds: Murmur present.  Pulmonary:     Breath sounds: No wheezing, rhonchi or rales.  Abdominal:     General: Bowel sounds are normal. There is no distension.     Palpations: Abdomen is soft.     Tenderness: There is no abdominal tenderness. There is no right CVA tenderness, left CVA tenderness, guarding or rebound.  Musculoskeletal:     Cervical back: Normal range of motion and neck supple.     Right lower leg: Edema present.     Left lower leg: Edema present.     Comments: trace  Skin:    General: Skin is warm and dry.  Neurological:     General: No focal deficit present.     Mental Status: She is alert and oriented to person, place, and time. Mental status is at baseline.     Motor: No weakness.     Coordination: Coordination normal.     Gait: Gait abnormal.  Psychiatric:        Mood and Affect: Mood normal.        Behavior: Behavior normal.         Thought Content: Thought content normal.        Judgment: Judgment normal.     Labs reviewed: Basic Metabolic Panel: Recent Labs    11/27/18 0000 12/29/18 0000 01/14/19 0700 08/18/19 0705 08/24/19 1425  NA 141   < > 139 143 138  K 4.3   < > 4.3 3.9 3.9  CL 108   < > 105 107 109  CO2 24   < > 25 25 21*  GLUCOSE  --    < > 105* 106* 106*  BUN 32*   < > 29* 36* 30*  CREATININE 1.6*   < > 1.50* 1.64* 1.69*  CALCIUM 9.4   < > 9.2 9.1 9.2  MG 2.1  --   --   --   --   PHOS 4.2  --   --   --   --    < > = values in this interval not displayed.   Liver Function Tests: Recent Labs    11/27/18 0000 12/29/18 0000 01/14/19 0700 08/18/19 0705  AST  --  16 18 16   ALT  --  14 15 12   BILITOT  --  0.3 0.3 0.4  PROT  --  6.5 6.5 6.5  ALBUMIN 4.0  --   --   --    No results for input(s): LIPASE, AMYLASE in the last 8760 hours. No results for input(s): AMMONIA in the last 8760 hours. CBC: Recent Labs    12/29/18 0000 12/29/18 0000 08/18/19 0705 08/24/19 1425 09/21/19 1225  WBC 5.9   < > 6.2 9.9 4.3  NEUTROABS 2,749  --  3,522  --  1,535  HGB 11.3*   < > 10.7* 10.7* 10.7*  HCT 33.6*   < > 31.6* 32.5* 32.3*  MCV 89.4   < > 89.5 90.5 89.5  PLT 245   < > 177 232 147   < > = values in this interval not displayed.   Lipid Panel: Recent Labs    08/18/19 0705  CHOL 213*  HDL 34*  LDLCALC 150*  TRIG 155*  CHOLHDL 6.3*   Lab Results  Component Value Date   HGBA1C 5.5 08/18/2019    Procedures since last visit: No results found.  Assessment/Plan  Peripheral neuropathy Worse in pm, stabbing pain rather than restlessness,  f/u Neurology in one month, continue Gabapentin  Constipation Lactulose daily,  Generalized anxiety disorder Continue Lorazepam nightly as needed, gradual dose reduction discussed and encouraged.  Type 2 diabetes mellitus with diabetic chronic kidney disease ([place]) Diet controlled, will delay Eye/Foot exam due to COVID pandemic. Hgb a1c 5.5  08/2019  HTN (hypertension) Blood pressure is controlled, continue Nifedipine, Telmisartan, Labetalol.   GERD (gastroesophageal reflux disease) Stable, continue Omeprazole.    Labs/tests ordered: none  Next appt:  01/21/2020

## 2019-11-05 NOTE — Assessment & Plan Note (Addendum)
Continue Lorazepam nightly as needed, gradual dose reduction discussed and encouraged.

## 2019-11-06 ENCOUNTER — Encounter: Payer: Self-pay | Admitting: Nurse Practitioner

## 2019-11-06 NOTE — Patient Instructions (Signed)
Encourage gradual dose reduction of Lorazepam, better managing constipation/diarrhea, f/u in clinic 3 months.

## 2019-11-06 NOTE — Assessment & Plan Note (Signed)
Stable, continue Omeprazole.  

## 2019-11-06 NOTE — Assessment & Plan Note (Signed)
Blood pressure is controlled, continue Nifedipine, Telmisartan, Labetalol.

## 2019-11-06 NOTE — Assessment & Plan Note (Signed)
Diet controlled, will delay Eye/Foot exam due to COVID pandemic. Hgb a1c 5.5 08/2019

## 2019-11-09 DIAGNOSIS — Z23 Encounter for immunization: Secondary | ICD-10-CM | POA: Diagnosis not present

## 2019-11-20 ENCOUNTER — Other Ambulatory Visit: Payer: Self-pay | Admitting: Adult Health

## 2019-12-01 ENCOUNTER — Ambulatory Visit: Payer: Self-pay | Admitting: Neurology

## 2019-12-02 ENCOUNTER — Encounter: Payer: Self-pay | Admitting: Adult Health

## 2019-12-02 ENCOUNTER — Other Ambulatory Visit: Payer: Self-pay

## 2019-12-02 ENCOUNTER — Ambulatory Visit (INDEPENDENT_AMBULATORY_CARE_PROVIDER_SITE_OTHER): Payer: Medicare Other | Admitting: Adult Health

## 2019-12-02 VITALS — BP 110/50 | HR 58 | Temp 97.0°F | Ht 66.0 in | Wt 138.6 lb

## 2019-12-02 DIAGNOSIS — G609 Hereditary and idiopathic neuropathy, unspecified: Secondary | ICD-10-CM | POA: Diagnosis not present

## 2019-12-02 NOTE — Progress Notes (Signed)
PATIENT: Jacqueline Dodson DOB: 14-Apr-1927  REASON FOR VISIT: follow up HISTORY FROM: patient  HISTORY OF PRESENT ILLNESS: Today 12/02/19:  Jacqueline Dodson is a 86 year old female with a history of neuropathy.  She returns today for follow-up.  She continues on gabapentin 100 mg at lunch and at bedtime.  Reports that this is managing her neuropathy.  She reports on occasion she will get burning and tingling in the thighs and then the arch of her feet.  She denies any changes with her gait or balance.  Continues to use a Rollator.  No falls.  Returns today for an evaluation.  HISTORY  06/01/19: Jacqueline Dodson is a 86 year old female with a history of neuropathy.  She returns today for follow-up.  She states her neuropathy is under relatively good control.  She states about a month ago she had sharp pain in the right heel.  She states that this lasted 4 to 5 hours.  She is now taking gabapentin at noon and in the evening.  She reports that she is sleeping better.  She continues to use a Rollator.  She does feel weaker and more tired.  She did have a fall in June when she got up at night to go to the bathroom.  She states that the walker got ahead of her.  She continues to live at [facility] home in independent living.  She returns today for an evaluation.  REVIEW OF SYSTEMS: Out of a complete 14 system review of symptoms, the patient complains only of the following symptoms, and all other reviewed systems are negative.  ALLERGIES: Allergies  Allergen Reactions  . Amlodipine Other (See Comments)    Reaction:  Critically decreased sodium and potassium levels.    . Enalapril Other (See Comments)    Reaction:  Unknown   . Hctz [Hydrochlorothiazide] Other (See Comments)    Reaction:  Unknown   . Penicillins Other (See Comments)    Reaction:  Unknown   . Amoxicillin Swelling and Rash    HOME MEDICATIONS: Outpatient Medications Prior to Visit  Medication Sig Dispense Refill  . acetaminophen (TYLENOL) 500 MG  tablet Take 500 mg by mouth as needed.    . calcium carbonate (TUMS - DOSED IN MG ELEMENTAL CALCIUM) 500 MG chewable tablet Chew 1 tablet by mouth as needed for indigestion or heartburn.     . cholecalciferol (VITAMIN D) 25 MCG (1000 UT) tablet Take 1,000 Units by mouth daily.    Marland Kitchen gabapentin (NEURONTIN) 100 MG capsule TAKE 1 CAPSULE TWICE DAILY 180 capsule 1  . labetalol (NORMODYNE) 100 MG tablet TAKE 1 TABLET TWICE A DAY 180 tablet 1  . LORazepam (ATIVAN) 0.5 MG tablet Take 1 tablet (0.5 mg total) by mouth at bedtime. 30 tablet 0  . Multiple Vitamins-Minerals (PRESERVISION AREDS 2 PO) Take 1 tablet by mouth daily.    Marland Kitchen NIFEdipine (PROCARDIA XL/NIFEDICAL XL) 60 MG 24 hr tablet TAKE 1 TABLET DAILY FOR    BLOOD PRESSURE 90 tablet 1  . omeprazole (PRILOSEC OTC) 20 MG tablet Take 20 mg by mouth every other day.     . Probiotic Product (PROBIOTIC ADVANCED PO) Take by mouth.    . telmisartan (MICARDIS) 80 MG tablet TAKE 1 TABLET DAILY FOR    BLOOD PRESSURE 90 tablet 1   No facility-administered medications prior to visit.    PAST MEDICAL HISTORY: Past Medical History:  Diagnosis Date  . Abnormality of gait   . Acute, but ill-defined, cerebrovascular disease   .  Anxiety   . Arthritis   . Chronic kidney disease, stage II (mild)   . Cystocele, midline   . Dermatophytosis of the body   . Disturbance of skin sensation   . Dizziness and giddiness   . Duodenal ulcer, unspecified as acute or chronic, without hemorrhage, perforation, or obstruction   . Edema   . Elevated blood pressure reading without diagnosis of hypertension   . GERD (gastroesophageal reflux disease)   . Hemorrhoids   . Hypertension   . Left bundle branch hemiblock   . Lumbago   . Macular degeneration (senile) of retina, unspecified   . Myalgia and myositis, unspecified   . Orthostatic hypotension   . Other abnormal blood chemistry   . Other malaise and fatigue   . Personal history of fall   . Polyneuropathy in other  diseases classified elsewhere ([place]) 02/16/2014  . Postmenopausal atrophic vaginitis   . 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 Rectocele   . Restless legs syndrome (RLS)   . Unspecified constipation   . Unspecified hereditary and idiopathic peripheral neuropathy   . Urgency of urination     PAST SURGICAL HISTORY: Past Surgical History:  Procedure Laterality Date  . ABDOMINAL HYSTERECTOMY  1972  . APPENDECTOMY  1945  . CATARACT EXTRACTION Bilateral   . EXTERNAL EAR SURGERY  2010   left outer ear basel cell removed  . TONSILLECTOMY  1940    FAMILY HISTORY: Family History  Problem Relation Age of Onset  . Aortic stenosis Mother   . Heart failure Mother   . Thrombosis Father   . Coronary artery disease Father   . Ataxia Sister   . Ataxia Brother   . Heart disease Brother   . Cancer Brother        Prostate  . Stroke Brother     SOCIAL HISTORY: Social History   Socioeconomic History  . Marital status: Widowed    Spouse name: Not on file  . Number of children: 5  . Years of education: college  . Highest education level: Not on file  Occupational History  . Occupation: Retired    Comment: Control and instrumentation engineer  Tobacco Use  . Smoking status: Never Smoker  . Smokeless tobacco: Never Used  Substance and Sexual Activity  . Alcohol use: No    Alcohol/week: 0.0 standard drinks  . Drug use: No  . Sexual activity: Never  Other Topics Concern  . Not on file  Social History Narrative   Patient is widowed, with 5 children.   Patient is right handed.   Patient has college education.   Patient does not drink caffeine.   Social Determinants of Health   Financial Resource Strain:   . Difficulty of Paying Living Expenses: Not on file  Food Insecurity:   . Worried About Charity fundraiser in the Last Year: Not on file  . Ran Out of Food in the Last Year: Not on file  Transportation Needs:   . Lack of Transportation (Medical): Not on file  . Lack of Transportation  (Non-Medical): Not on file  Physical Activity:   . Days of Exercise per Week: Not on file  . Minutes of Exercise per Session: Not on file  Stress:   . Feeling of Stress : Not on file  Social Connections:   . Frequency of Communication with Friends and Family: Not on file  . Frequency of Social Gatherings with Friends and Family: Not on file  . Attends Religious Services: Not  on file  . Active Member of Clubs or Organizations: Not on file  . Attends Archivist Meetings: Not on file  . Marital Status: Not on file  Intimate Partner Violence:   . Fear of Current or Ex-Partner: Not on file  . Emotionally Abused: Not on file  . Physically Abused: Not on file  . Sexually Abused: Not on file      PHYSICAL EXAM  Vitals:   12/02/19 1055  BP: (!) 110/50  Pulse: (!) 58  Temp: (!) 97 F (36.1 C)  Weight: 138 lb 9.6 oz (62.9 kg)  Height: 5\' 6"  (1.676 m)   Body mass index is 22.37 kg/m.  Generalized: Well developed, in no acute distress   Neurological examination  Mentation: Alert oriented to time, place, history taking. Follows all commands speech and language fluent Cranial nerve II-XII: Pupils were equal round reactive to light. Extraocular movements were full, visual field were full on confrontational test.. Head turning and shoulder shrug  were normal and symmetric. Motor: The motor testing reveals 5 over 5 strength of all 4 extremities. Good symmetric motor tone is noted throughout.  Sensory: Sensory testing is intact to soft touch on all 4 extremities. No evidence of extinction is noted.  Coordination: Cerebellar testing reveals good finger-nose-finger and heel-to-shin bilaterally.  Gait and station: Gait is normal.  Patient uses a Rollator when ambulating.  Tandem gait not attempted.   DIAGNOSTIC DATA (LABS, IMAGING, TESTING) - I reviewed patient records, labs, notes, testing and imaging myself where available.  Lab Results  Component Value Date   WBC 4.3  09/21/2019   HGB 10.7 (L) 09/21/2019   HCT 32.3 (L) 09/21/2019   MCV 89.5 09/21/2019   PLT 147 09/21/2019      Component Value Date/Time   NA 138 08/24/2019 1425   NA 141 11/27/2018 0000   K 3.9 08/24/2019 1425   CL 109 08/24/2019 1425   CL 108 11/27/2018 0000   CO2 21 (L) 08/24/2019 1425   CO2 24 11/27/2018 0000   GLUCOSE 106 (H) 08/24/2019 1425   BUN 30 (H) 08/24/2019 1425   BUN 32 (A) 11/27/2018 0000   CREATININE 1.69 (H) 08/24/2019 1425   CREATININE 1.64 (H) 08/18/2019 0705   CALCIUM 9.2 08/24/2019 1425   CALCIUM 9.4 11/27/2018 0000   PROT 6.5 08/18/2019 0705   ALBUMIN 4.0 11/27/2018 0000   AST 16 08/18/2019 0705   ALT 12 08/18/2019 0705   ALKPHOS 55 02/11/2017 1030   BILITOT 0.4 08/18/2019 0705   GFRNONAA 26 (L) 08/24/2019 1425   GFRNONAA 27 (L) 08/18/2019 0705   GFRAA 30 (L) 08/24/2019 1425   GFRAA 32 (L) 08/18/2019 0705   Lab Results  Component Value Date   CHOL 213 (H) 08/18/2019   HDL 34 (L) 08/18/2019   LDLCALC 150 (H) 08/18/2019   TRIG 155 (H) 08/18/2019   CHOLHDL 6.3 (H) 08/18/2019   Lab Results  Component Value Date   HGBA1C 5.5 08/18/2019   Lab Results  Component Value Date   VITAMINB12 427 09/21/2019   Lab Results  Component Value Date   TSH 3.83 10/07/2018      ASSESSMENT AND PLAN 86 y.o. year old female  has a past medical history of Abnormality of gait, Acute, but ill-defined, cerebrovascular disease, Anxiety, Arthritis, Chronic kidney disease, stage II (mild), Cystocele, midline, Dermatophytosis of the body, Disturbance of skin sensation, Dizziness and giddiness, Duodenal ulcer, unspecified as acute or chronic, without hemorrhage, perforation, or obstruction, Edema, Elevated  blood pressure reading without diagnosis of hypertension, GERD (gastroesophageal reflux disease), Hemorrhoids, Hypertension, Left bundle branch hemiblock, Lumbago, Macular degeneration (senile) of retina, unspecified, Myalgia and myositis, unspecified, Orthostatic  hypotension, Other abnormal blood chemistry, Other malaise and fatigue, Personal history of fall, Polyneuropathy in other diseases classified elsewhere ([place]) (02/16/2014), Postmenopausal atrophic vaginitis, Primary cerebellar degeneration (HCC), Rectocele, Restless legs syndrome (RLS), Unspecified constipation, Unspecified hereditary and idiopathic peripheral neuropathy, and Urgency of urination. here with:  1.  Peripheral neuropathy   -Continue gabapentin 100 mg twice a day -Advised if symptoms worsen or she develops new symptoms she should let us know. -Follow-up in 1 year or sooner if needed.   I spent 15 minutes with the patient this time was spent reviewing the chart, discussing medication and plan of care   Ward Givens, MSN, NP-C 12/02/2019, 10:47 AM [facility] [address] [place], [place] [address] [phone]

## 2019-12-02 NOTE — Patient Instructions (Signed)
Your Plan:  Continue gabapentin  If your symptoms worsen or you develop new symptoms please let us know.    Thank you for coming to see us at [facility]. I hope we have been able to provide you high quality care today.  You may receive a patient satisfaction survey over the next few weeks. We would appreciate your feedback and comments so that we may continue to improve ourselves and the health of our patients.  

## 2020-01-05 ENCOUNTER — Other Ambulatory Visit: Payer: Self-pay | Admitting: *Deleted

## 2020-01-05 DIAGNOSIS — F419 Anxiety disorder, unspecified: Secondary | ICD-10-CM

## 2020-01-05 MED ORDER — LORAZEPAM 0.5 MG PO TABS: 0.2500 mg | ORAL_TABLET | Freq: Every day | ORAL | 0 refills | Status: DC

## 2020-01-05 NOTE — Telephone Encounter (Signed)
Patient called and stated that she needs a refill on her Lorazepam. Patient stated that at her last OV Visit with [facility] she recommended patient to cut back to Lorazepam .25mg  at bedtime. Patient has tried cutting the .5mg  in half but not successful and wants the .25mg  instead. Explained to patient that there is not a .25mg  and that the tablet would have to be halved. Patient agreed.   I reviewed the last OV note 11/05/2019: Generalized anxiety disorder Continue Lorazepam nightly as needed, gradual dose reduction discussed and encouraged.   Corrected Medication list and pended Rx for approval from [facility].

## 2020-01-06 ENCOUNTER — Other Ambulatory Visit: Payer: Self-pay | Admitting: Nurse Practitioner

## 2020-01-06 NOTE — Telephone Encounter (Signed)
High risk or very high risk warning populated when attempting to refill medication. RX request sent to PCP for review and approval if warranted.   

## 2020-01-12 ENCOUNTER — Telehealth: Payer: Self-pay

## 2020-01-12 NOTE — Telephone Encounter (Signed)
Patient called back and asked if she could  try the Imodium to see if it stops diarrhea because she can't come to office visit as she doesn't think she can make it from her home to you Please Advise

## 2020-01-12 NOTE — Telephone Encounter (Signed)
Patient said diarrhea started at 10 PM 0n 01/11/20. She has not tried anything OTC, only drinking Gatorade.  Will try Imodium. Asked her to call the office again if diarrhea persisted.

## 2020-01-12 NOTE — Telephone Encounter (Signed)
Patient is [place] at [facility] She is complaining of severe diarrhea and was looking for a suggestion of what she can take. I asked her if she had asked the floor nurse in [place] and she said she was under the impression that they were no longer handling issues there.

## 2020-01-12 NOTE — Telephone Encounter (Signed)
She cannot come to clinic today due to the frequent diarrhea.

## 2020-01-14 DIAGNOSIS — N183 Chronic kidney disease, stage 3 unspecified: Secondary | ICD-10-CM | POA: Diagnosis not present

## 2020-01-20 DIAGNOSIS — I129 Hypertensive chronic kidney disease with stage 1 through stage 4 chronic kidney disease, or unspecified chronic kidney disease: Secondary | ICD-10-CM | POA: Diagnosis not present

## 2020-01-20 DIAGNOSIS — N2581 Secondary hyperparathyroidism of renal origin: Secondary | ICD-10-CM | POA: Diagnosis not present

## 2020-01-20 DIAGNOSIS — N183 Chronic kidney disease, stage 3 unspecified: Secondary | ICD-10-CM | POA: Diagnosis not present

## 2020-01-20 DIAGNOSIS — N189 Chronic kidney disease, unspecified: Secondary | ICD-10-CM | POA: Diagnosis not present

## 2020-01-20 DIAGNOSIS — D631 Anemia in chronic kidney disease: Secondary | ICD-10-CM | POA: Diagnosis not present

## 2020-01-21 ENCOUNTER — Ambulatory Visit (INDEPENDENT_AMBULATORY_CARE_PROVIDER_SITE_OTHER): Payer: Medicare Other | Admitting: Ophthalmology

## 2020-01-21 ENCOUNTER — Other Ambulatory Visit: Payer: Self-pay

## 2020-01-21 ENCOUNTER — Encounter: Payer: Self-pay | Admitting: Nurse Practitioner

## 2020-01-21 ENCOUNTER — Encounter (INDEPENDENT_AMBULATORY_CARE_PROVIDER_SITE_OTHER): Payer: Self-pay | Admitting: Ophthalmology

## 2020-01-21 DIAGNOSIS — H353124 Nonexudative age-related macular degeneration, left eye, advanced atrophic with subfoveal involvement: Secondary | ICD-10-CM | POA: Insufficient documentation

## 2020-01-21 DIAGNOSIS — H353222 Exudative age-related macular degeneration, left eye, with inactive choroidal neovascularization: Secondary | ICD-10-CM

## 2020-01-21 DIAGNOSIS — H353113 Nonexudative age-related macular degeneration, right eye, advanced atrophic without subfoveal involvement: Secondary | ICD-10-CM

## 2020-01-21 DIAGNOSIS — H353212 Exudative age-related macular degeneration, right eye, with inactive choroidal neovascularization: Secondary | ICD-10-CM

## 2020-01-21 NOTE — Progress Notes (Signed)
01/21/2020     CHIEF COMPLAINT Patient presents for Retina Follow Up   HISTORY OF PRESENT ILLNESS: Jacqueline Dodson is a 86 y.o. female who presents to the clinic today for:   HPI    Retina Follow Up    Patient presents with  Dry AMD.  In both eyes.  Duration of 9 months.  Since onset it is stable.          Comments    9 month f.u - OCT OU Patient denies change in vision and overall has no complaints.        Last edited by Gerda Diss, [place] on 01/21/2020 10:22 AM. (History)      Referring physician: Mast, Man X, NP [address],  [place] [address]  HISTORICAL INFORMATION:   Selected notes from the MEDICAL RECORD NUMBER    Lab Results  Component Value Date   HGBA1C 5.5 08/18/2019     CURRENT MEDICATIONS: No current outpatient medications on file. (Ophthalmic Drugs)   No current facility-administered medications for this visit. (Ophthalmic Drugs)   Current Outpatient Medications (Other)  Medication Sig  . acetaminophen (TYLENOL) 500 MG tablet Take 500 mg by mouth as needed.  . calcium carbonate (TUMS - DOSED IN MG ELEMENTAL CALCIUM) 500 MG chewable tablet Chew 1 tablet by mouth as needed for indigestion or heartburn.   . cholecalciferol (VITAMIN D) 25 MCG (1000 UT) tablet Take 1,000 Units by mouth daily.  Marland Kitchen gabapentin (NEURONTIN) 100 MG capsule TAKE 1 CAPSULE TWICE DAILY  . LORazepam (ATIVAN) 0.5 MG tablet Take 0.5 tablets (0.25 mg total) by mouth at bedtime.  . Multiple Vitamins-Minerals (PRESERVISION AREDS 2 PO) Take 1 tablet by mouth daily.  Marland Kitchen NIFEdipine (PROCARDIA XL/NIFEDICAL XL) 60 MG 24 hr tablet TAKE 1 TABLET DAILY FOR    BLOOD PRESSURE  . omeprazole (PRILOSEC OTC) 20 MG tablet Take 20 mg by mouth every other day.   . Probiotic Product (PROBIOTIC ADVANCED PO) Take by mouth.  . labetalol (NORMODYNE) 100 MG tablet TAKE 1 TABLET TWICE A DAY (Patient not taking: Reported on 01/21/2020)  . telmisartan (MICARDIS) 80 MG tablet TAKE 1 TABLET DAILY FOR     BLOOD PRESSURE (Patient not taking: Reported on 01/21/2020)   No current facility-administered medications for this visit. (Other)      REVIEW OF SYSTEMS:    ALLERGIES Allergies  Allergen Reactions  . Amlodipine Other (See Comments)    Reaction:  Critically decreased sodium and potassium levels.    . Enalapril Other (See Comments)    Reaction:  Unknown   . Hctz [Hydrochlorothiazide] Other (See Comments)    Reaction:  Unknown   . Penicillins Other (See Comments)    Reaction:  Unknown   . Amoxicillin Swelling and Rash    PAST MEDICAL HISTORY Past Medical History:  Diagnosis Date  . Abnormality of gait   . Acute, but ill-defined, cerebrovascular disease   . Anxiety   . Arthritis   . Chronic kidney disease, stage II (mild)   . Cystocele, midline   . Dermatophytosis of the body   . Disturbance of skin sensation   . Dizziness and giddiness   . Duodenal ulcer, unspecified as acute or chronic, without hemorrhage, perforation, or obstruction   . Edema   . Elevated blood pressure reading without diagnosis of hypertension   . GERD (gastroesophageal reflux disease)   . Hemorrhoids   . Hypertension   . Left bundle branch hemiblock   . Lumbago   .  Macular degeneration (senile) of retina, unspecified   . Myalgia and myositis, unspecified   . Orthostatic hypotension   . Other abnormal blood chemistry   . Other malaise and fatigue   . Personal history of fall   . Polyneuropathy in other diseases classified elsewhere ([place]) 02/16/2014  . Postmenopausal atrophic vaginitis   . Primary cerebellar degeneration ([place])   . Rectocele   . Restless legs syndrome (RLS)   . Unspecified constipation   . Unspecified hereditary and idiopathic peripheral neuropathy   . Urgency of urination    Past Surgical History:  Procedure Laterality Date  . ABDOMINAL HYSTERECTOMY  1972  . APPENDECTOMY  1945  . CATARACT EXTRACTION Bilateral   . EXTERNAL EAR SURGERY  2010   left outer ear basel cell  removed  . TONSILLECTOMY  1940    FAMILY HISTORY Family History  Problem Relation Age of Onset  . Aortic stenosis Mother   . Heart failure Mother   . Thrombosis Father   . Coronary artery disease Father   . Ataxia Sister   . Ataxia Brother   . Heart disease Brother   . Cancer Brother        Prostate  . Stroke Brother     SOCIAL HISTORY Social History   Tobacco Use  . Smoking status: Never Smoker  . Smokeless tobacco: Never Used  Substance Use Topics  . Alcohol use: No    Alcohol/week: 0.0 standard drinks  . Drug use: No         OPHTHALMIC EXAM:  Base Eye Exam    Visual Acuity (Snellen - Linear)      Right Left   Dist cc 20/30-2 CF @ 3'   Dist ph cc NI NI   Correction: Glasses       Tonometry (Tonopen, 10:28 AM)      Right Left   Pressure 15 15       Pupils      Pupils Dark Light Shape React APD   Right PERRL 3 2 Round Brisk None   Left PERRL 3 2 Round Brisk None       Visual Fields (Counting fingers)      Left Right    Full Full       Extraocular Movement      Right Left    Full Full       Neuro/Psych    Oriented x3: Yes   Mood/Affect: Normal       Dilation    Both eyes: 1.0% Mydriacyl, 2.5% Phenylephrine @ 10:28 AM        Slit Lamp and Fundus Exam    External Exam      Right Left   External Normal Normal       Slit Lamp Exam      Right Left   Lids/Lashes Normal Normal   Conjunctiva/Sclera White and quiet White and quiet   Cornea Clear Clear   Anterior Chamber Deep and quiet Deep and quiet   Iris Round and reactive Round and reactive   Lens Posterior chamber intraocular lens Posterior chamber intraocular lens   Anterior Vitreous Normal Normal       Fundus Exam      Right Left   Posterior Vitreous Posterior vitreous detachment Posterior vitreous detachment   Disc Normal Normal   C/D Ratio 0.4 0.4   Macula Retinal pigment epithelial mottling, Atrophy, Advanced age related macular degeneration Retinal pigment epithelial  mottling, Atrophy, Advanced age related macular degeneration  Vessels Normal Normal   Periphery Normal Normal          IMAGING AND PROCEDURES  Imaging and Procedures for 01/21/20  OCT, Retina - OU - Both Eyes       Right Eye Quality was good. Scan locations included subfoveal. Progression has been stable. Findings include abnormal foveal contour, outer retinal tubulation, subretinal hyper-reflective material, outer retinal atrophy, inner retinal atrophy, no SRF.   Left Eye Quality was good. Scan locations included subfoveal. Progression has been stable. Findings include outer retinal atrophy, inner retinal atrophy, no SRF, abnormal foveal contour.                 ASSESSMENT/PLAN:  No problem-specific Assessment & Plan notes found for this encounter.      ICD-10-CM   1. 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 Exudative age-related macular degeneration of right eye with inactive choroidal neovascularization ([place])  H35.3212   3. Exudative age-related macular degeneration of left eye with inactive choroidal neovascularization ([place])  H35.3222   4. Advanced nonexudative age-related macular degeneration of left eye with subfoveal involvement  H35.3124     1.  2.  3.  Ophthalmic Meds Ordered this visit:  No orders of the defined types were placed in this encounter.      No follow-ups on file.  There are no Patient Instructions on file for this visit.   Explained the diagnoses, plan, and follow up with the patient and they expressed understanding.  Patient expressed understanding of the importance of proper follow up care.   Clent Demark Loletha Bertini M.D. Diseases & Surgery of the Retina and Vitreous Retina & Diabetic [place] 01/21/20     Abbreviations: M myopia (nearsighted); A astigmatism; H hyperopia (farsighted); P presbyopia; Mrx spectacle prescription;  CTL  contact lenses; OD right eye; OS left eye; OU both eyes  XT exotropia; ET esotropia; PEK punctate epithelial keratitis; PEE punctate epithelial erosions; DES dry eye syndrome; MGD meibomian gland dysfunction; ATs artificial tears; PFAT's preservative free artificial tears; [place] nuclear sclerotic cataract; PSC posterior subcapsular cataract; ERM epi-retinal membrane; PVD posterior vitreous detachment; RD retinal detachment; DM diabetes mellitus; DR diabetic retinopathy; NPDR non-proliferative diabetic retinopathy; PDR proliferative diabetic retinopathy; CSME clinically significant macular edema; DME diabetic macular edema; dbh dot blot hemorrhages; CWS cotton wool spot; POAG primary open angle glaucoma; C/D cup-to-disc ratio; HVF humphrey visual field; GVF goldmann visual field; OCT optical coherence tomography; IOP intraocular pressure; BRVO Branch retinal vein occlusion; CRVO central retinal vein occlusion; CRAO central retinal artery occlusion; BRAO branch retinal artery occlusion; RT retinal tear; SB scleral buckle; PPV pars plana vitrectomy; VH Vitreous hemorrhage; PRP panretinal laser photocoagulation; IVK intravitreal kenalog; VMT vitreomacular traction; MH Macular hole;  NVD neovascularization of the disc; NVE neovascularization elsewhere; AREDS age related eye disease study; ARMD age related macular degeneration; POAG primary open angle glaucoma; EBMD epithelial/anterior basement membrane dystrophy; ACIOL anterior chamber intraocular lens; IOL intraocular lens; PCIOL posterior chamber intraocular lens; Phaco/IOL phacoemulsification with intraocular lens placement; [place] photorefractive keratectomy; LASIK laser assisted in situ keratomileusis; HTN hypertension; DM diabetes mellitus; COPD chronic obstructive pulmonary disease

## 2020-01-21 NOTE — Patient Instructions (Signed)
The nature of dry age related macular degeneration was discussed with the patient as well as its possible conversion to wet. The results of the AREDS 2 study was discussed with the patient. A diet rich in dark leafy green vegetables was advised and specific recommendations were made regarding supplements with AREDS 2 formulation . Control of hypertension and serum cholesterol may slow the disease. Smoking cessation is mandatory to slow the disease and diminish the risk of progressing to wet age related macular degeneration. The patient was instructed in the use of an [place] and was told to return immediately for any changes in the Grid. Stressed to the patient do not rub eyesThe nature of dry age related macular degeneration was discussed with the patient as well as its possible conversion to wet. The results of the AREDS 2 study was discussed with the patient. A diet rich in dark leafy green vegetables was advised and specific recommendations were made regarding supplements with AREDS 2 formulation . Control of hypertension and serum cholesterol may slow the disease. Smoking cessation is mandatory to slow the disease and diminish the risk of progressing to wet age related macular degeneration. The patient was instructed in the use of an [place] and was told to return immediately for any changes in the Grid. Stressed to the patient do not rub eyes

## 2020-02-11 ENCOUNTER — Non-Acute Institutional Stay: Payer: Medicare Other | Admitting: Nurse Practitioner

## 2020-02-11 ENCOUNTER — Encounter: Payer: Self-pay | Admitting: Nurse Practitioner

## 2020-02-11 ENCOUNTER — Other Ambulatory Visit: Payer: Self-pay

## 2020-02-11 DIAGNOSIS — N184 Chronic kidney disease, stage 4 (severe): Secondary | ICD-10-CM | POA: Diagnosis not present

## 2020-02-11 DIAGNOSIS — G4700 Insomnia, unspecified: Secondary | ICD-10-CM

## 2020-02-11 DIAGNOSIS — G63 Polyneuropathy in diseases classified elsewhere: Secondary | ICD-10-CM | POA: Diagnosis not present

## 2020-02-11 DIAGNOSIS — K219 Gastro-esophageal reflux disease without esophagitis: Secondary | ICD-10-CM | POA: Diagnosis not present

## 2020-02-11 DIAGNOSIS — I10 Essential (primary) hypertension: Secondary | ICD-10-CM

## 2020-02-11 DIAGNOSIS — E785 Hyperlipidemia, unspecified: Secondary | ICD-10-CM | POA: Diagnosis not present

## 2020-02-11 DIAGNOSIS — K5904 Chronic idiopathic constipation: Secondary | ICD-10-CM

## 2020-02-11 NOTE — Assessment & Plan Note (Signed)
Mood and sleep, stable, continue Lorazepam 0.25mg  qhs.

## 2020-02-11 NOTE — Patient Instructions (Addendum)
Dr. Lottie Rater nephorology doing labs. the next appointment in 3-4 monhts.  Will call you with appointments.

## 2020-02-11 NOTE — Assessment & Plan Note (Addendum)
Stable, continue Gabapentin 100mg  bid. Saw neurology regularly.

## 2020-02-11 NOTE — Assessment & Plan Note (Signed)
Stable, continue Omeprazole 20mg  qod.

## 2020-02-11 NOTE — Assessment & Plan Note (Addendum)
Blood pressure is controlled, continue Nifedipine, stopped Dr. Lottie Rater Telmisartan, Labetalol.

## 2020-02-11 NOTE — Assessment & Plan Note (Addendum)
Creat 1.5-1.6, f/u [facility]

## 2020-02-11 NOTE — Progress Notes (Signed)
Location:   clinic [facility]   Place of Service:  Clinic (12) Provider: Marlana Latus NP  Code Status: DNR Goals of Care: [place] 02/11/2020  Does Patient Have a Medical Advance Directive? Yes  Type of Paramedic of [place];Living will  Does patient want to make changes to medical advance directive? No - Patient declined  Copy of [place] in Chart? Yes - validated most recent copy scanned in chart (See row information)  Would patient like information on creating a medical advance directive? -     Chief Complaint  Patient presents with  . Medical Management of Chronic Issues    4 month follow up. Patient would like to discuss her digestive system and blood pressure medications. Patient does  not see a podiatrist. She is seeing an eye doctor.   Marland Kitchen Health Maintenance    HPI: Patient is a 86 y.o. female seen today for medical management of chronic diseases.    Hx of HTN, blood pressure is controlled, off Telmisartan 80mg  qd, Labetalol 100mg  qd, on Nifedipine 60mg  qd. GERD, stable, on Omeprazole 20mg  qod. Her mood is stable, on Lorazepam 0.25mg  qhs. Peripheral neuropathy in BLE, stable, on Gabapentin 100mg  bid.    Past Medical History:  Diagnosis Date  . Abnormality of gait   . Acute, but ill-defined, cerebrovascular disease   . Anxiety   . Arthritis   . Chronic kidney disease, stage II (mild)   . Cystocele, midline   . Dermatophytosis of the body   . Disturbance of skin sensation   . Dizziness and giddiness   . Duodenal ulcer, unspecified as acute or chronic, without hemorrhage, perforation, or obstruction   . Edema   . Elevated blood pressure reading without diagnosis of hypertension   . GERD (gastroesophageal reflux disease)   . Hemorrhoids   . Hypertension   . Left bundle branch hemiblock   . Lumbago   . Macular degeneration (senile) of retina, unspecified   . Myalgia and myositis, unspecified   . Orthostatic  hypotension   . Other abnormal blood chemistry   . Other malaise and fatigue   . Personal history of fall   . Polyneuropathy in other diseases classified elsewhere ([place]) 02/16/2014  . Postmenopausal atrophic vaginitis   . Primary cerebellar degeneration ([place])   . Rectocele   . Restless legs syndrome (RLS)   . Unspecified constipation   . Unspecified hereditary and idiopathic peripheral neuropathy   . Urgency of urination     Past Surgical History:  Procedure Laterality Date  . ABDOMINAL HYSTERECTOMY  1972  . APPENDECTOMY  1945  . CATARACT EXTRACTION Bilateral   . EXTERNAL EAR SURGERY  2010   left outer ear basel cell removed  . TONSILLECTOMY  1940    Allergies  Allergen Reactions  . Amlodipine Other (See Comments)    Reaction:  Critically decreased sodium and potassium levels.    . Enalapril Other (See Comments)    Reaction:  Unknown   . Hctz [Hydrochlorothiazide] Other (See Comments)    Reaction:  Unknown   . Penicillins Other (See Comments)    Reaction:  Unknown   . Amoxicillin Swelling and Rash    Allergies as of 02/11/2020      Reactions   Amlodipine Other (See Comments)   Reaction:  Critically decreased sodium and potassium levels.     Enalapril Other (See Comments)   Reaction:  Unknown    Hctz [hydrochlorothiazide] Other (See Comments)  Reaction:  Unknown    Penicillins Other (See Comments)   Reaction:  Unknown    Amoxicillin Swelling, Rash      Medication List       Accurate as of Feb 11, 2020 11:59 PM. If you have any questions, ask your nurse or doctor.        acetaminophen 500 MG tablet Commonly known as: TYLENOL Take 500 mg by mouth as needed.   calcium carbonate 500 MG chewable tablet Commonly known as: TUMS - dosed in mg elemental calcium Chew 1 tablet by mouth as needed for indigestion or heartburn.   cholecalciferol 25 MCG (1000 UNIT) tablet Commonly known as: VITAMIN D Take 1,000 Units by mouth daily.   gabapentin 100 MG capsule  Commonly known as: NEURONTIN TAKE 1 CAPSULE TWICE DAILY   labetalol 100 MG tablet Commonly known as: NORMODYNE TAKE 1 TABLET TWICE A DAY   LORazepam 0.5 MG tablet Commonly known as: ATIVAN Take 0.5 tablets (0.25 mg total) by mouth at bedtime.   NIFEdipine 60 MG 24 hr tablet Commonly known as: PROCARDIA XL/NIFEDICAL XL TAKE 1 TABLET DAILY FOR    BLOOD PRESSURE   omeprazole 20 MG tablet Commonly known as: PRILOSEC OTC Take 20 mg by mouth every other day.   PRESERVISION AREDS 2 PO Take 1 tablet by mouth daily.   PROBIOTIC ADVANCED PO Take by mouth.   telmisartan 80 MG tablet Commonly known as: MICARDIS TAKE 1 TABLET DAILY FOR    BLOOD PRESSURE       Review of Systems:  Review of Systems  Constitutional: Negative for activity change, appetite change, fatigue, fever and unexpected weight change.  HENT: Positive for hearing loss. Negative for congestion and voice change.   Eyes: Negative for visual disturbance.  Respiratory: Negative for cough and shortness of breath.   Cardiovascular: Negative for leg swelling.  Gastrointestinal: Negative for abdominal distention, abdominal pain and constipation.       Alternating constipation, diarrhea if taking Laxative.   Genitourinary: Negative for difficulty urinating, dysuria and urgency.       2-3x/night bathroom trips.   Musculoskeletal: Positive for arthralgias and gait problem.  Skin: Negative for color change and pallor.  Neurological: Negative for speech difficulty, weakness and light-headedness.  Psychiatric/Behavioral: Negative for behavioral problems and sleep disturbance. The patient is not nervous/anxious.     Health Maintenance  Topic Date Due  . FOOT EXAM  Never done  . HEMOGLOBIN A1C  02/15/2020  . INFLUENZA VACCINE  05/08/2020  . OPHTHALMOLOGY EXAM  01/20/2021  . TETANUS/TDAP  09/04/2027  . COVID-19 Vaccine  Completed  . PNA vac Low Risk Adult  Completed  . DEXA SCAN  Discontinued    Physical Exam:  Vitals:   02/11/20 1400  BP: 126/60  Pulse: 67  Temp: (!) 97.5 F (36.4 C)  SpO2: 97%  Weight: 146 lb 6.4 oz (66.4 kg)  Height: 5\' 6"  (1.676 m)   Body mass index is 23.63 kg/m. Physical Exam Vitals and nursing note reviewed.  Constitutional:      Appearance: Normal appearance. She is normal weight.  HENT:     Head: Normocephalic and atraumatic.     Nose: Nose normal.     Mouth/Throat:     Mouth: Mucous membranes are moist.  Eyes:     Extraocular Movements: Extraocular movements intact.     Conjunctiva/sclera: Conjunctivae normal.     Pupils: Pupils are equal, round, and reactive to light.  Cardiovascular:     Rate  and Rhythm: Normal rate and regular rhythm.     Heart sounds: Murmur present.  Pulmonary:     Breath sounds: No rales.  Abdominal:     General: Bowel sounds are normal.     Palpations: Abdomen is soft.     Tenderness: There is no abdominal tenderness.  Musculoskeletal:     Cervical back: Normal range of motion and neck supple.     Right lower leg: No edema.     Left lower leg: No edema.  Skin:    General: Skin is warm and dry.  Neurological:     General: No focal deficit present.     Mental Status: She is alert and oriented to person, place, and time. Mental status is at baseline.     Motor: No weakness.     Coordination: Coordination normal.     Gait: Gait abnormal.  Psychiatric:        Mood and Affect: Mood normal.        Behavior: Behavior normal.        Thought Content: Thought content normal.        Judgment: Judgment normal.     Labs reviewed: Basic Metabolic Panel: Recent Labs    08/18/19 0705 08/24/19 1425  NA 143 138  K 3.9 3.9  CL 107 109  CO2 25 21*  GLUCOSE 106* 106*  BUN 36* 30*  CREATININE 1.64* 1.69*  CALCIUM 9.1 9.2   Liver Function Tests: Recent Labs    08/18/19 0705  AST 16  ALT 12  BILITOT 0.4  PROT 6.5   No results for input(s): LIPASE, AMYLASE in the last 8760 hours. No results for input(s): AMMONIA in the  last 8760 hours. CBC: Recent Labs    08/18/19 0705 08/24/19 1425 09/21/19 1225  WBC 6.2 9.9 4.3  NEUTROABS 3,522  --  1,535  HGB 10.7* 10.7* 10.7*  HCT 31.6* 32.5* 32.3*  MCV 89.5 90.5 89.5  PLT 177 232 147   Lipid Panel: Recent Labs    08/18/19 0705  CHOL 213*  HDL 34*  LDLCALC 150*  TRIG 155*  CHOLHDL 6.3*   Lab Results  Component Value Date   HGBA1C 5.5 08/18/2019    Procedures since last visit: OCT, Retina - OU - Both Eyes  Result Date: 01/21/2020 Right Eye Quality was good. Scan locations included subfoveal. Progression has been stable. Findings include abnormal foveal contour, outer retinal tubulation, subretinal hyper-reflective material, outer retinal atrophy, inner retinal atrophy, no SRF. Left Eye Quality was good. Scan locations included subfoveal. Progression has been stable. Findings include outer retinal atrophy, inner retinal atrophy, no SRF, abnormal foveal contour.    Assessment/Plan  HTN (hypertension) Blood pressure is controlled, continue Nifedipine, stopped Dr. Lottie Rater Telmisartan, Labetalol.   GERD (gastroesophageal reflux disease) Stable, continue Omeprazole 20mg  qod.   Peripheral neuropathy Stable, continue Gabapentin 100mg  bid. Saw neurology regularly.   Insomnia Mood and sleep, stable, continue Lorazepam 0.25mg  qhs.   CKD (chronic kidney disease) stage 4, GFR 15-29 ml/min (HCC) Creat 1.5-1.6, f/u [facility]  Constipation Failed Lactulose, now controlled on diet.    Labs/tests ordered:  None, labs at [facility].  Next appt:  F/u Dr. Lyndel Safe 3-4 months

## 2020-02-11 NOTE — Assessment & Plan Note (Signed)
Failed Lactulose, now controlled on diet.

## 2020-02-12 ENCOUNTER — Encounter: Payer: Self-pay | Admitting: Nurse Practitioner

## 2020-03-01 ENCOUNTER — Other Ambulatory Visit: Payer: Self-pay | Admitting: *Deleted

## 2020-03-01 DIAGNOSIS — F419 Anxiety disorder, unspecified: Secondary | ICD-10-CM

## 2020-03-01 MED ORDER — LORAZEPAM 0.5 MG PO TABS: 0.2500 mg | ORAL_TABLET | Freq: Every day | ORAL | 0 refills | Status: DC

## 2020-03-01 NOTE — Telephone Encounter (Signed)
Patient requested refill.  Pended Rx and sent to [facility] for approval.  

## 2020-04-28 ENCOUNTER — Other Ambulatory Visit: Payer: Self-pay | Admitting: Nurse Practitioner

## 2020-04-28 DIAGNOSIS — F419 Anxiety disorder, unspecified: Secondary | ICD-10-CM

## 2020-04-28 NOTE — Telephone Encounter (Signed)
Received refill Request from pharmacy Pended Rx and sent to [facility] for approval.

## 2020-05-14 ENCOUNTER — Other Ambulatory Visit: Payer: Self-pay | Admitting: Adult Health

## 2020-06-30 ENCOUNTER — Other Ambulatory Visit: Payer: Self-pay | Admitting: Nurse Practitioner

## 2020-06-30 DIAGNOSIS — F419 Anxiety disorder, unspecified: Secondary | ICD-10-CM

## 2020-06-30 NOTE — Telephone Encounter (Signed)
High risk or very high risk warning populated when attempting to refill medication. RX request sent to PCP for review and approval if warranted.   

## 2020-06-30 NOTE — Telephone Encounter (Signed)
Contract completed 10/2019  Last refill 7/22.  Last OV 02/11/20  Last labs 12/20

## 2020-07-20 DIAGNOSIS — Z23 Encounter for immunization: Secondary | ICD-10-CM | POA: Diagnosis not present

## 2020-07-25 ENCOUNTER — Encounter (INDEPENDENT_AMBULATORY_CARE_PROVIDER_SITE_OTHER): Payer: Medicare Other | Admitting: Ophthalmology

## 2020-07-26 ENCOUNTER — Other Ambulatory Visit: Payer: Self-pay

## 2020-07-26 ENCOUNTER — Ambulatory Visit (INDEPENDENT_AMBULATORY_CARE_PROVIDER_SITE_OTHER): Payer: Medicare Other | Admitting: Ophthalmology

## 2020-07-26 ENCOUNTER — Encounter (INDEPENDENT_AMBULATORY_CARE_PROVIDER_SITE_OTHER): Payer: Self-pay | Admitting: Ophthalmology

## 2020-07-26 DIAGNOSIS — H353124 Nonexudative age-related macular degeneration, left eye, advanced atrophic with subfoveal involvement: Secondary | ICD-10-CM

## 2020-07-26 DIAGNOSIS — H353212 Exudative age-related macular degeneration, right eye, with inactive choroidal neovascularization: Secondary | ICD-10-CM

## 2020-07-26 DIAGNOSIS — H353113 Nonexudative age-related macular degeneration, right eye, advanced atrophic without subfoveal involvement: Secondary | ICD-10-CM | POA: Diagnosis not present

## 2020-07-26 NOTE — Progress Notes (Signed)
07/26/2020     CHIEF COMPLAINT Patient presents for Retina Follow Up   HISTORY OF PRESENT ILLNESS: Jacqueline Dodson is a 86 y.o. female who presents to the clinic today for:   HPI    Retina Follow Up    Patient presents with  Dry AMD.  In both eyes.  Severity is moderate.  Duration of 6 months.  Since onset it is stable.  I, the attending physician,  performed the HPI with the patient and updated documentation appropriately.          Comments    6 Month f\u OU. OCT  Pt states DVA is not as clear since last exam. Denies any other complaints.       Last edited by Tilda Franco on 07/26/2020 10:15 AM. (History)      Referring physician: Mast, Man X, NP [address],  [place] [address]  HISTORICAL INFORMATION:   Selected notes from the MEDICAL RECORD NUMBER    Lab Results  Component Value Date   HGBA1C 5.5 08/18/2019     CURRENT MEDICATIONS: No current outpatient medications on file. (Ophthalmic Drugs)   No current facility-administered medications for this visit. (Ophthalmic Drugs)   Current Outpatient Medications (Other)  Medication Sig   acetaminophen (TYLENOL) 500 MG tablet Take 500 mg by mouth as needed.   calcium carbonate (TUMS - DOSED IN MG ELEMENTAL CALCIUM) 500 MG chewable tablet Chew 1 tablet by mouth as needed for indigestion or heartburn.    cholecalciferol (VITAMIN D) 25 MCG (1000 UT) tablet Take 1,000 Units by mouth daily.   gabapentin (NEURONTIN) 100 MG capsule TAKE 1 CAPSULE TWICE DAILY   labetalol (NORMODYNE) 100 MG tablet TAKE 1 TABLET TWICE A DAY   LORazepam (ATIVAN) 0.5 MG tablet TAKE 1/2 TABLET AT BEDTIME.   Multiple Vitamins-Minerals (PRESERVISION AREDS 2 PO) Take 1 tablet by mouth daily.   NIFEdipine (PROCARDIA XL/NIFEDICAL XL) 60 MG 24 hr tablet TAKE 1 TABLET DAILY FOR    BLOOD PRESSURE   omeprazole (PRILOSEC OTC) 20 MG tablet Take 20 mg by mouth every other day.    Probiotic Product (PROBIOTIC ADVANCED PO) Take by mouth.    telmisartan (MICARDIS) 80 MG tablet TAKE 1 TABLET DAILY FOR    BLOOD PRESSURE   No current facility-administered medications for this visit. (Other)      REVIEW OF SYSTEMS:    ALLERGIES Allergies  Allergen Reactions   Amlodipine Other (See Comments)    Reaction:  Critically decreased sodium and potassium levels.     Enalapril Other (See Comments)    Reaction:  Unknown    Hctz [Hydrochlorothiazide] Other (See Comments)    Reaction:  Unknown    Penicillins Other (See Comments)    Reaction:  Unknown    Amoxicillin Swelling and Rash    PAST MEDICAL HISTORY Past Medical History:  Diagnosis Date   Abnormality of gait    Acute, but ill-defined, cerebrovascular disease    Anxiety    Arthritis    Chronic kidney disease, stage II (mild)    Cystocele, midline    Dermatophytosis of the body    Disturbance of skin sensation    Dizziness and giddiness    Duodenal ulcer, unspecified as acute or chronic, without hemorrhage, perforation, or obstruction    Edema    Elevated blood pressure reading without diagnosis of hypertension    GERD (gastroesophageal reflux disease)    Hemorrhoids    Hypertension    Left bundle branch  hemiblock    Lumbago    Macular degeneration (senile) of retina, unspecified    Myalgia and myositis, unspecified    Orthostatic hypotension    Other abnormal blood chemistry    Other malaise and fatigue    Personal history of fall    Polyneuropathy in other diseases classified elsewhere ([place]) 02/16/2014   Postmenopausal atrophic vaginitis    Primary cerebellar degeneration (HCC)    Rectocele    Restless legs syndrome (RLS)    Unspecified constipation    Unspecified hereditary and idiopathic peripheral neuropathy    Urgency of urination    Past Surgical History:  Procedure Laterality Date   ABDOMINAL HYSTERECTOMY  1972   APPENDECTOMY  1945   CATARACT EXTRACTION Bilateral    EXTERNAL EAR SURGERY  2010   left  outer ear basel cell removed   TONSILLECTOMY  1940    FAMILY HISTORY Family History  Problem Relation Age of Onset   Aortic stenosis Mother    Heart failure Mother    Thrombosis Father    Coronary artery disease Father    Ataxia Sister    Ataxia Brother    Heart disease Brother    Cancer Brother        Prostate   Stroke Brother     SOCIAL HISTORY Social History   Tobacco Use   Smoking status: Never Smoker   Smokeless tobacco: Never Used  Scientific laboratory technician Use: Never used  Substance Use Topics   Alcohol use: No    Alcohol/week: 0.0 standard drinks   Drug use: No         OPHTHALMIC EXAM:  Base Eye Exam    Visual Acuity (Snellen - Linear)      Right Left   Dist cc 20/50 + CF @ 3'   Dist ph cc NI NI   Correction: Glasses       Tonometry (Tonopen, 10:19 AM)      Right Left   Pressure 15 17       Pupils      Pupils Dark Light Shape React APD   Right PERRL 3 2 Round Brisk None   Left PERRL 3 2 Round Brisk None       Visual Fields (Counting fingers)      Left Right    Full Full       Neuro/Psych    Oriented x3: Yes   Mood/Affect: Normal       Dilation    Both eyes: 1.0% Mydriacyl, 2.5% Phenylephrine @ 10:19 AM        Slit Lamp and Fundus Exam    External Exam      Right Left   External Normal Normal       Slit Lamp Exam      Right Left   Lids/Lashes Normal Normal   Conjunctiva/Sclera White and quiet White and quiet   Cornea Clear Clear   Anterior Chamber Deep and quiet Deep and quiet   Iris Round and reactive Round and reactive   Lens Posterior chamber intraocular lens Posterior chamber intraocular lens   Anterior Vitreous Normal Normal       Fundus Exam      Right Left   Posterior Vitreous Posterior vitreous detachment Posterior vitreous detachment   Disc Normal Normal   C/D Ratio 0.4 0.4   Macula Retinal pigment epithelial mottling, Atrophy with small portion of the temporal aspect of the fovea spared,, Advanced  age related macular degeneration Retinal pigment  epithelial mottling, Atrophy subfoveal, Advanced age related macular degeneration   Vessels Normal Normal   Periphery Normal Normal          IMAGING AND PROCEDURES  Imaging and Procedures for 07/26/20  OCT, Retina - OU - Both Eyes       Right Eye Quality was good. Scan locations included subfoveal. Central Foveal Thickness: 291. Findings include outer retinal atrophy, abnormal foveal contour, no SRF, no IRF.   Left Eye Quality was good. Scan locations included subfoveal. Central Foveal Thickness: 218. Findings include no IRF, no SRF, abnormal foveal contour, inner retinal atrophy, central retinal atrophy, outer retinal atrophy.   Notes No signs of active CNVM OU                ASSESSMENT/PLAN:  Advanced nonexudative age-related macular degeneration of left eye with subfoveal involvement No signs of CNVM, accounts for acuity.  Advanced nonexudative age-related macular degeneration of right eye without subfoveal involvement No signs of CNVM, and atrophy has not extended directly into the fovea.      ICD-10-CM   1. Exudative age-related macular degeneration of right eye with inactive choroidal neovascularization (HCC)  H35.3212 OCT, Retina - OU - Both Eyes  2. Advanced nonexudative age-related macular degeneration of left eye with subfoveal involvement  H35.3124   3. Advanced nonexudative age-related macular degeneration of right eye without subfoveal involvement  H35.3113     1.  No signs of active CNVM, will continue to observe.  2.  3.  Ophthalmic Meds Ordered this visit:  No orders of the defined types were placed in this encounter.      Return in about 9 months (around 04/25/2021) for COLOR FP, OCT, DILATE OU.  Patient Instructions  Patient instructed to contact the office promptly for new onset visual acuity decline or distortion    Explained the diagnoses, plan, and follow up with the patient and  they expressed understanding.  Patient expressed understanding of the importance of proper follow up care.   Clent Demark Zariyah Stephens M.D. Diseases & Surgery of the Retina and Vitreous [facility]ina [facility] [place] 07/26/20     Abbreviations: M myopia (nearsighted); A astigmatism; H hyperopia (farsighted); P presbyopia; Mrx spectacle prescription;  CTL contact lenses; OD right eye; OS left eye; OU both eyes  XT exotropia; ET esotropia; PEK punctate epithelial keratitis; PEE punctate epithelial erosions; DES dry eye syndrome; MGD meibomian gland dysfunction; ATs artificial tears; PFAT's preservative free artificial tears; [place] nuclear sclerotic cataract; PSC posterior subcapsular cataract; ERM epi-retinal membrane; PVD posterior vitreous detachment; RD retinal detachment; DM diabetes mellitus; DR diabetic retinopathy; NPDR non-proliferative diabetic retinopathy; PDR proliferative diabetic retinopathy; CSME clinically significant macular edema; DME diabetic macular edema; dbh dot blot hemorrhages; CWS cotton wool spot; POAG primary open angle glaucoma; C/D cup-to-disc ratio; HVF humphrey visual field; GVF goldmann visual field; OCT optical coherence tomography; IOP intraocular pressure; BRVO Branch retinal vein occlusion; CRVO central retinal vein occlusion; CRAO central retinal artery occlusion; BRAO branch retinal artery occlusion; RT retinal tear; SB scleral buckle; PPV pars plana vitrectomy; VH Vitreous hemorrhage; PRP panretinal laser photocoagulation; IVK intravitreal kenalog; VMT vitreomacular traction; MH Macular hole;  NVD neovascularization of the disc; NVE neovascularization elsewhere; AREDS age related eye disease study; ARMD age related macular degeneration; POAG primary open angle glaucoma; EBMD epithelial/anterior basement membrane dystrophy; ACIOL anterior chamber intraocular lens; IOL intraocular lens; PCIOL posterior chamber intraocular lens; Phaco/IOL phacoemulsification with intraocular lens  placement; [place] photorefractive keratectomy; LASIK laser assisted in situ keratomileusis;  keratomileusis; HTN hypertension; DM diabetes mellitus; COPD chronic obstructive pulmonary disease

## 2020-07-26 NOTE — Assessment & Plan Note (Signed)
No signs of CNVM, accounts for acuity.

## 2020-07-26 NOTE — Assessment & Plan Note (Signed)
No signs of CNVM, and atrophy has not extended directly into the fovea.

## 2020-07-26 NOTE — Patient Instructions (Signed)
Patient instructed to contact the office promptly for new onset visual acuity decline or distortion 

## 2020-08-16 DIAGNOSIS — Z23 Encounter for immunization: Secondary | ICD-10-CM | POA: Diagnosis not present

## 2020-08-18 DIAGNOSIS — Z961 Presence of intraocular lens: Secondary | ICD-10-CM | POA: Diagnosis not present

## 2020-08-18 DIAGNOSIS — H353132 Nonexudative age-related macular degeneration, bilateral, intermediate dry stage: Secondary | ICD-10-CM | POA: Diagnosis not present

## 2020-08-18 DIAGNOSIS — H40013 Open angle with borderline findings, low risk, bilateral: Secondary | ICD-10-CM | POA: Diagnosis not present

## 2020-08-18 DIAGNOSIS — H35371 Puckering of macula, right eye: Secondary | ICD-10-CM | POA: Diagnosis not present

## 2020-08-18 DIAGNOSIS — H4321 Crystalline deposits in vitreous body, right eye: Secondary | ICD-10-CM | POA: Diagnosis not present

## 2020-08-30 ENCOUNTER — Other Ambulatory Visit: Payer: Self-pay | Admitting: Internal Medicine

## 2020-08-30 DIAGNOSIS — F419 Anxiety disorder, unspecified: Secondary | ICD-10-CM

## 2020-08-30 NOTE — Telephone Encounter (Signed)
Patient is requesting refill on "Lorazepam 0.5mg ". Last refill was 06/30/2020 with 30 tablets to be taken 1/2 at bedtime. No additional refills were given. Patient is due for refill. Medication pend and sent to PCP Mast, Man X, NP. Please Advise.

## 2020-10-31 ENCOUNTER — Other Ambulatory Visit: Payer: Self-pay | Admitting: Nurse Practitioner

## 2020-10-31 DIAGNOSIS — F419 Anxiety disorder, unspecified: Secondary | ICD-10-CM

## 2020-10-31 NOTE — Telephone Encounter (Signed)
Last OV 02/11/20  Needs contract, last done 10/2019   Last filled 08/30/20

## 2020-11-01 ENCOUNTER — Other Ambulatory Visit: Payer: Self-pay | Admitting: Nurse Practitioner

## 2020-11-01 DIAGNOSIS — F419 Anxiety disorder, unspecified: Secondary | ICD-10-CM

## 2020-11-02 ENCOUNTER — Other Ambulatory Visit: Payer: Self-pay | Admitting: Nurse Practitioner

## 2020-11-02 DIAGNOSIS — F419 Anxiety disorder, unspecified: Secondary | ICD-10-CM

## 2020-11-04 ENCOUNTER — Other Ambulatory Visit: Payer: Self-pay | Admitting: Nurse Practitioner

## 2020-11-04 DIAGNOSIS — F419 Anxiety disorder, unspecified: Secondary | ICD-10-CM

## 2020-11-05 ENCOUNTER — Other Ambulatory Visit: Payer: Self-pay | Admitting: Nurse Practitioner

## 2020-11-05 DIAGNOSIS — F419 Anxiety disorder, unspecified: Secondary | ICD-10-CM

## 2020-11-07 NOTE — Telephone Encounter (Signed)
Pended Rx and sent to [facility] for approval.  Last Rx filled the Transmission Failed and Rx did not go through pharmacy.

## 2020-11-10 ENCOUNTER — Encounter: Payer: Self-pay | Admitting: Nurse Practitioner

## 2020-11-10 ENCOUNTER — Telehealth: Payer: Self-pay

## 2020-11-10 ENCOUNTER — Other Ambulatory Visit: Payer: Self-pay

## 2020-11-10 ENCOUNTER — Telehealth: Payer: Self-pay | Admitting: *Deleted

## 2020-11-10 ENCOUNTER — Ambulatory Visit (INDEPENDENT_AMBULATORY_CARE_PROVIDER_SITE_OTHER): Payer: Medicare Other | Admitting: Nurse Practitioner

## 2020-11-10 DIAGNOSIS — Z0000 Encounter for general adult medical examination without abnormal findings: Secondary | ICD-10-CM

## 2020-11-10 MED ORDER — GABAPENTIN 100 MG PO CAPS: 100.0000 mg | ORAL_CAPSULE | Freq: Two times a day (BID) | ORAL | 1 refills | Status: DC

## 2020-11-10 NOTE — Progress Notes (Signed)
   This service is provided via telemedicine  No vital signs collected/recorded due to the encounter was a telemedicine visit.   Location of patient (ex: home, work):  Home  Patient consents to a telephone visit: Yes, see telephone visit dated 11/10/20  Location of the provider (ex: office, home):  [facility], Office   Name of any referring provider:  N/A  Names of all persons participating in the telemedicine service and their role in the encounter:  S.Chrae B/CMA, Sherrie Mustache, NP, and Patient   Time spent on call:  11 min with medical assistant

## 2020-11-10 NOTE — Telephone Encounter (Signed)
Prescription filled for gabapetin [facility].

## 2020-11-10 NOTE — Telephone Encounter (Signed)
[name], charrier are scheduled for a virtual visit with your provider today.    Just as we do with appointments in the office, we must obtain your consent to participate.  Your consent will be active for this visit and any virtual visit you may have with one of our providers in the next 365 days.    If you have a MyChart account, I can also send a copy of this consent to you electronically.  All virtual visits are billed to your insurance company just like a traditional visit in the office.  As this is a virtual visit, video technology does not allow for your provider to perform a traditional examination.  This may limit your provider's ability to fully assess your condition.  If your provider identifies any concerns that need to be evaluated in person or the need to arrange testing such as labs, EKG, etc, we will make arrangements to do so.    Although advances in technology are sophisticated, we cannot ensure that it will always work on either your end or our end.  If the connection with a video visit is poor, we may have to switch to a telephone visit.  With either a video or telephone visit, we are not always able to ensure that we have a secure connection.   I need to obtain your verbal consent now.   Are you willing to proceed with your visit today?   Jacqueline Dodson has provided verbal consent on 11/10/2020 for a virtual visit (video or telephone).   Leigh Aurora [place], [place] 11/10/2020  2:32 PM

## 2020-11-10 NOTE — Patient Instructions (Signed)
Jacqueline Dodson , Thank you for taking time to come for your Medicare Wellness Visit. I appreciate your ongoing commitment to your health goals. Please review the following plan we discussed and let me know if I can assist you in the future.   Screening recommendations/referrals: Colonoscopy aged out Mammogram aged out Bone Density declined.  Recommended yearly ophthalmology/optometry visit for glaucoma screening and checkup Recommended yearly dental visit for hygiene and checkup  Vaccinations: Influenza vaccine up to date Pneumococcal vaccine up to date Tdap vaccine up to date Shingles vaccine RECOMMENDED to get shingrix at your local pharmacy   Advanced directives: on file.   Conditions/risks identified: advanced age.   Next appointment: year    Preventive Care 79 Years and Older, Female Preventive care refers to lifestyle choices and visits with your health care provider that can promote health and wellness. What does preventive care include?  A yearly physical exam. This is also called an annual well check.  Dental exams once or twice a year.  Routine eye exams. Ask your health care provider how often you should have your eyes checked.  Personal lifestyle choices, including:  Daily care of your teeth and gums.  Regular physical activity.  Eating a healthy diet.  Avoiding tobacco and drug use.  Limiting alcohol use.  Practicing safe sex.  Taking low-dose aspirin every day.  Taking vitamin and mineral supplements as recommended by your health care provider. What happens during an annual well check? The services and screenings done by your health care provider during your annual well check will depend on your age, overall health, lifestyle risk factors, and family history of disease. Counseling  Your health care provider may ask you questions about your:  Alcohol use.  Tobacco use.  Drug use.  Emotional well-being.  Home and relationship well-being.  Sexual  activity.  Eating habits.  History of falls.  Memory and ability to understand (cognition).  Work and work Statistician.  Reproductive health. Screening  You may have the following tests or measurements:  Height, weight, and BMI.  Blood pressure.  Lipid and cholesterol levels. These may be checked every 5 years, or more frequently if you are over 75 years old.  Skin check.  Lung cancer screening. You may have this screening every year starting at age 85 if you have a 30-pack-year history of smoking and currently smoke or have quit within the past 15 years.  Fecal occult blood test (FOBT) of the stool. You may have this test every year starting at age 37.  Flexible sigmoidoscopy or colonoscopy. You may have a sigmoidoscopy every 5 years or a colonoscopy every 10 years starting at age 56.  Hepatitis C blood test.  Hepatitis B blood test.  Sexually transmitted disease (STD) testing.  Diabetes screening. This is done by checking your blood sugar (glucose) after you have not eaten for a while (fasting). You may have this done every 1-3 years.  Bone density scan. This is done to screen for osteoporosis. You may have this done starting at age 58.  Mammogram. This may be done every 1-2 years. Talk to your health care provider about how often you should have regular mammograms. Talk with your health care provider about your test results, treatment options, and if necessary, the need for more tests. Vaccines  Your health care provider may recommend certain vaccines, such as:  Influenza vaccine. This is recommended every year.  Tetanus, diphtheria, and acellular pertussis (Tdap, Td) vaccine. You may need a Td booster  every 10 years.  Zoster vaccine. You may need this after age 23.  Pneumococcal 13-valent conjugate (PCV13) vaccine. One dose is recommended after age 33.  Pneumococcal polysaccharide (PPSV23) vaccine. One dose is recommended after age 39. Talk to your health care  provider about which screenings and vaccines you need and how often you need them. This information is not intended to replace advice given to you by your health care provider. Make sure you discuss any questions you have with your health care provider. Document Released: 10/21/2015 Document Revised: 06/13/2016 Document Reviewed: 07/26/2015 Elsevier Interactive Patient Education  2017 [employer] Prevention in the Home Falls can cause injuries. They can happen to people of all ages. There are many things you can do to make your home safe and to help prevent falls. What can I do on the outside of my home?  Regularly fix the edges of walkways and driveways and fix any cracks.  Remove anything that might make you trip as you walk through a door, such as a raised step or threshold.  Trim any bushes or trees on the path to your home.  Use bright outdoor lighting.  Clear any walking paths of anything that might make someone trip, such as rocks or tools.  Regularly check to see if handrails are loose or broken. Make sure that both sides of any steps have handrails.  Any raised decks and porches should have guardrails on the edges.  Have any leaves, snow, or ice cleared regularly.  Use sand or salt on walking paths during winter.  Clean up any spills in your garage right away. This includes oil or grease spills. What can I do in the bathroom?  Use night lights.  Install grab bars by the toilet and in the tub and shower. Do not use towel bars as grab bars.  Use non-skid mats or decals in the tub or shower.  If you need to sit down in the shower, use a plastic, non-slip stool.  Keep the floor dry. Clean up any water that spills on the floor as soon as it happens.  Remove soap buildup in the tub or shower regularly.  Attach bath mats securely with double-sided non-slip rug tape.  Do not have throw rugs and other things on the floor that can make you trip. What can I do in  the bedroom?  Use night lights.  Make sure that you have a light by your bed that is easy to reach.  Do not use any sheets or blankets that are too big for your bed. They should not hang down onto the floor.  Have a firm chair that has side arms. You can use this for support while you get dressed.  Do not have throw rugs and other things on the floor that can make you trip. What can I do in the kitchen?  Clean up any spills right away.  Avoid walking on wet floors.  Keep items that you use a lot in easy-to-reach places.  If you need to reach something above you, use a strong step stool that has a grab bar.  Keep electrical cords out of the way.  Do not use floor polish or wax that makes floors slippery. If you must use wax, use non-skid floor wax.  Do not have throw rugs and other things on the floor that can make you trip. What can I do with my stairs?  Do not leave any items on the stairs.  Make  sure that there are handrails on both sides of the stairs and use them. Fix handrails that are broken or loose. Make sure that handrails are as long as the stairways.  Check any carpeting to make sure that it is firmly attached to the stairs. Fix any carpet that is loose or worn.  Avoid having throw rugs at the top or bottom of the stairs. If you do have throw rugs, attach them to the floor with carpet tape.  Make sure that you have a light switch at the top of the stairs and the bottom of the stairs. If you do not have them, ask someone to add them for you. What else can I do to help prevent falls?  Wear shoes that:  Do not have high heels.  Have rubber bottoms.  Are comfortable and fit you well.  Are closed at the toe. Do not wear sandals.  If you use a stepladder:  Make sure that it is fully opened. Do not climb a closed stepladder.  Make sure that both sides of the stepladder are locked into place.  Ask someone to hold it for you, if possible.  Clearly mark and  make sure that you can see:  Any grab bars or handrails.  First and last steps.  Where the edge of each step is.  Use tools that help you move around (mobility aids) if they are needed. These include:  Canes.  Walkers.  Scooters.  Crutches.  Turn on the lights when you go into a dark area. Replace any light bulbs as soon as they burn out.  Set up your furniture so you have a clear path. Avoid moving your furniture around.  If any of your floors are uneven, fix them.  If there are any pets around you, be aware of where they are.  Review your medicines with your doctor. Some medicines can make you feel dizzy. This can increase your chance of falling. Ask your doctor what other things that you can do to help prevent falls. This information is not intended to replace advice given to you by your health care provider. Make sure you discuss any questions you have with your health care provider. Document Released: 07/21/2009 Document Revised: 03/01/2016 Document Reviewed: 10/29/2014 Elsevier Interactive Patient Education  2017 [employer].

## 2020-11-10 NOTE — Progress Notes (Signed)
Subjective:   Quintina Hakeem is a 87 y.o. female who presents for Medicare Annual (Subsequent) preventive examination.  Review of Systems     Cardiac Risk Factors include: advanced age (>54men, >31 women)     Objective:    There were no vitals filed for this visit. There is no height or weight on file to calculate BMI.  Advanced Directives 11/10/2020 02/11/2020 08/24/2019 09/26/2017 08/22/2017 02/07/2017 05/31/2016  Does Patient Have a Medical Advance Directive? Yes Yes Yes Yes Yes Yes Yes  Type of Paramedic of [place];Living will [place];Living will [place];Living will [place];Living will [place];Living will [place];Living will [place];Living will  Does patient want to make changes to medical advance directive? No - Patient declined No - Patient declined - - No - Patient declined No - Patient declined No - Patient declined  Copy of [place] in Chart? Yes - validated most recent copy scanned in chart (See row information) Yes - validated most recent copy scanned in chart (See row information) No - copy requested Yes Yes Yes Yes  Would patient like information on creating a medical advance directive? - - - - - - -    Current Medications (verified) Outpatient Encounter Medications as of 11/10/2020  Medication Sig  . acetaminophen (TYLENOL) 500 MG tablet Take 500 mg by mouth as needed.  . calcium carbonate (TUMS - DOSED IN MG ELEMENTAL CALCIUM) 500 MG chewable tablet Chew 1 tablet by mouth as needed for indigestion or heartburn.   . cholecalciferol (VITAMIN D) 25 MCG (1000 UT) tablet Take 1,000 Units by mouth daily.  Marland Kitchen gabapentin (NEURONTIN) 100 MG capsule Take 1 capsule (100 mg total) by mouth 2 (two) times daily.  Marland Kitchen LORazepam (ATIVAN) 0.5 MG tablet TAKE 1/2 TABLET AT BEDTIME.  . Multiple Vitamins-Minerals (PRESERVISION AREDS 2  PO) Take 1 tablet by mouth daily.  Marland Kitchen NIFEdipine (PROCARDIA XL/NIFEDICAL XL) 60 MG 24 hr tablet TAKE 1 TABLET DAILY FOR    BLOOD PRESSURE  . omeprazole (PRILOSEC OTC) 20 MG tablet Take 20 mg by mouth every other day.   . Probiotic Product (PROBIOTIC ADVANCED PO) Take by mouth.  . [DISCONTINUED] gabapentin (NEURONTIN) 100 MG capsule TAKE 1 CAPSULE TWICE DAILY  . [DISCONTINUED] labetalol (NORMODYNE) 100 MG tablet TAKE 1 TABLET TWICE A DAY (Patient not taking: Reported on 11/10/2020)  . [DISCONTINUED] telmisartan (MICARDIS) 80 MG tablet TAKE 1 TABLET DAILY FOR    BLOOD PRESSURE (Patient not taking: Reported on 11/10/2020)   No facility-administered encounter medications on file as of 11/10/2020.    Allergies (verified) Amlodipine, Enalapril, Hctz [hydrochlorothiazide], Penicillins, and Amoxicillin   History: Past Medical History:  Diagnosis Date  . Abnormality of gait   . Acute, but ill-defined, cerebrovascular disease   . Anxiety   . Arthritis   . Chronic kidney disease, stage II (mild)   . Cystocele, midline   . Dermatophytosis of the body   . Disturbance of skin sensation   . Dizziness and giddiness   . Duodenal ulcer, unspecified as acute or chronic, without hemorrhage, perforation, or obstruction   . Edema   . Elevated blood pressure reading without diagnosis of hypertension   . GERD (gastroesophageal reflux disease)   . Hemorrhoids   . Hypertension   . Left bundle branch hemiblock   . Lumbago   . Macular degeneration (senile) of retina, unspecified   . Myalgia and  myositis, unspecified   . Orthostatic hypotension   . Other abnormal blood chemistry   . Other malaise and fatigue   . Personal history of fall   . Polyneuropathy in other diseases classified elsewhere ([place]) 02/16/2014  . Postmenopausal atrophic vaginitis   . 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 Rectocele   . Restless legs syndrome (RLS)   . Unspecified constipation   . Unspecified hereditary and idiopathic  peripheral neuropathy   . Urgency of urination    Past Surgical History:  Procedure Laterality Date  . ABDOMINAL HYSTERECTOMY  1972  . APPENDECTOMY  1945  . CATARACT EXTRACTION Bilateral   . EXTERNAL EAR SURGERY  2010   left outer ear basel cell removed  . TONSILLECTOMY  1940   Family History  Problem Relation Age of Onset  . Aortic stenosis Mother   . Heart failure Mother   . Thrombosis Father   . Coronary artery disease Father   . Ataxia Sister   . Ataxia Brother   . Heart disease Brother   . Cancer Brother        Prostate  . Stroke Brother    Social History   Socioeconomic History  . Marital status: Widowed    Spouse name: Not on file  . Number of children: 5  . Years of education: college  . Highest education level: Not on file  Occupational History  . Occupation: Retired    Comment: Control and instrumentation engineer  Tobacco Use  . Smoking status: Never Smoker  . Smokeless tobacco: Never Used  Vaping Use  . Vaping Use: Never used  Substance and Sexual Activity  . Alcohol use: No    Alcohol/week: 0.0 standard drinks  . Drug use: No  . Sexual activity: Never  Other Topics Concern  . Not on file  Social History Narrative   Patient is widowed, with 5 children.   Patient is right handed.   Patient has college education.   Patient does not drink caffeine.   Social Determinants of Health   Financial Resource Strain: Not on file  Food Insecurity: Not on file  Transportation Needs: Not on file  Physical Activity: Not on file  Stress: Not on file  Social Connections: Not on file    Tobacco Counseling Counseling given: Not Answered   Clinical Intake:  Pre-visit preparation completed: Yes  Pain : No/denies pain     BMI - recorded: 23 Nutritional Status: BMI of 19-24  Normal Nutritional Risks: None Diabetes: No  How often do you need to have someone help you when you read instructions, pamphlets, or other written materials from your doctor or pharmacy?: 1 -  Never  Diabetic?no         Activities of Daily Living In your present state of health, do you have any difficulty performing the following activities: 11/10/2020  Hearing? N  Vision? Y  Comment distances  Difficulty concentrating or making decisions? Y  Walking or climbing stairs? N  Comment does not climb stairs  Dressing or bathing? N  Doing errands, shopping? Y  Comment daughter helps  Conservation officer, nature and eating ? N  Using the Toilet? N  In the past six months, have you accidently leaked urine? Y  Do you have problems with loss of bowel control? Y  Managing your Medications? N  Managing your Finances? Y  Housekeeping or managing your Housekeeping? N  Some recent data might be hidden    Patient Care Team: Mast, Man X, NP as PCP -  General (Internal Medicine) Lavonna Monarch, MD as Consulting Physician (Dermatology) Clent Jacks, MD as Consulting Physician (Ophthalmology) Elmarie Shiley, MD as Consulting Physician (Nephrology) Zadie Rhine Clent Demark, MD as Consulting Physician (Ophthalmology) [place] Mast, Man X, NP as Nurse Practitioner (Internal Medicine)  Indicate any recent Medical Services you may have received from other than [facility] providers in the past year (date may be approximate).     Assessment:   This is a routine wellness examination for [place].  Hearing/Vision screen  Hearing Screening   125Hz  250Hz  500Hz  1000Hz  2000Hz  3000Hz  4000Hz  6000Hz  8000Hz   Right ear:           Left ear:           Comments: No hearing issues   Vision Screening Comments: Last eye exam less than 12 months ago   Dietary issues and exercise activities discussed: Current Exercise Habits: Home exercise routine, Type of exercise: calisthenics, Time (Minutes): 25, Frequency (Times/Week): 7, Weekly Exercise (Minutes/Week): 175  Goals    . DIET - INCREASE WATER INTAKE     During day before dinner    . Increase physical activity     Uses more NuStep machine when available       Depression Screen PHQ 2/9 Scores 11/10/2020 09/26/2017 08/22/2017 08/28/2016  PHQ - 2 Score 0 0 0 0    Fall Risk Fall Risk  11/10/2020 02/11/2020 11/05/2019 08/21/2019 04/02/2019  Falls in the past year? 0 0 0 1 0  Comment - - - - -  Number falls in past yr: 0 0 0 0 0  Injury with Fall? 0 - - 0 0  Risk for fall due to : - - - - -    [place]:  Any stairs in or around the home? No  If so, are there any without handrails? No  Home free of loose throw rugs in walkways, pet beds, electrical cords, etc? Yes  Adequate lighting in your home to reduce risk of falls? Yes   ASSISTIVE DEVICES UTILIZED TO PREVENT FALLS:  Life alert? No  Use of a cane, walker or w/c? Yes  Grab bars in the bathroom? Yes  Shower chair or bench in shower? Yes  Elevated toilet seat or a handicapped toilet? Yes   TIMED UP AND GO:  Was the test performed? No .    Cognitive Function: MMSE - Mini Mental State Exam 05/21/2019 09/26/2017  Not completed: (No Data) (No Data)  Orientation to time 5 5  Orientation to Place 5 5  Registration 3 3  Attention/ Calculation 5 5  Recall 3 3  Language- name 2 objects 2 2  Language- repeat 1 1  Language- follow 3 step command 3 3  Language- read & follow direction 1 1  Write a sentence 1 1  Copy design 0 1  Total score 29 30     6CIT Screen 11/10/2020  What Year? 0 points  What month? 0 points  What time? 0 points  Count back from 20 0 points  Months in reverse 0 points  Repeat phrase 2 points  Total Score 2    Immunizations Immunization History  Administered Date(s) Administered  . Influenza Whole 07/08/2012, 07/10/2018  . Influenza, High Dose Seasonal PF 07/17/2017, 07/22/2019, 07/20/2020  . Influenza-Unspecified 07/26/2014, 06/09/2015, 07/19/2016  . Moderna SARS-COV2 Booster Vaccination 08/16/2020  . Moderna Sars-Covid-2 Vaccination 10/12/2019, 11/09/2019  . PPD Test 09/07/1982  . Pneumococcal Conjugate-13 09/03/2017   . Pneumococcal Polysaccharide-23 10/08/1994  .  Td 10/09/1995  . Tdap 09/03/2017  . Zoster 10/08/2005    TDAP status: Up to date  Flu Vaccine status: Up to date  Pneumococcal vaccine status: Up to date  Covid-19 vaccine status: Completed vaccines  Qualifies for Shingles Vaccine? Yes   Zostavax completed Yes   Shingrix Completed?: No.    Education has been provided regarding the importance of this vaccine. Patient has been advised to call insurance company to determine out of pocket expense if they have not yet received this vaccine. Advised may also receive vaccine at local pharmacy or Health Dept. Verbalized acceptance and understanding.  Screening Tests Health Maintenance  Topic Date Due  . FOOT EXAM  Never done  . HEMOGLOBIN A1C  02/15/2020  . OPHTHALMOLOGY EXAM  07/26/2021  . TETANUS/TDAP  09/04/2027  . INFLUENZA VACCINE  Completed  . COVID-19 Vaccine  Completed  . PNA vac Low Risk Adult  Completed  . DEXA SCAN  Discontinued    Health Maintenance  Health Maintenance Due  Topic Date Due  . FOOT EXAM  Never done  . HEMOGLOBIN A1C  02/15/2020    Colorectal cancer screening: No longer required.   Mammogram status: No longer required due to aged out.  Declined bone density Lung Cancer Screening: (Low Dose CT Chest recommended if Age 25-80 years, 30 pack-year currently smoking OR have quit w/in 15years.) does not qualify.     Additional Screening:  Hepatitis C Screening: does not qualify;   Vision Screening: Recommended annual ophthalmology exams for early detection of glaucoma and other disorders of the eye. Is the patient up to date with their annual eye exam?  Yes  Who is the provider or what is the name of the office in which the patient attends annual eye exams? [facility] If pt is not established with a provider, would they like to be referred to a provider to establish care? Yes .   Dental Screening: Recommended annual dental exams for proper oral  hygiene  Community Resource Referral / Chronic Care Management: CRR required this visit?  No   CCM required this visit?  No      Plan:     I have personally reviewed and noted the following in the patient's chart:   . Medical and social history . Use of alcohol, tobacco or illicit drugs  . Current medications and supplements . Functional ability and status . Nutritional status . Physical activity . Advanced directives . List of other physicians . Hospitalizations, surgeries, and ER visits in previous 12 months . Vitals . Screenings to include cognitive, depression, and falls . Referrals and appointments  In addition, I have reviewed and discussed with patient certain preventive protocols, quality metrics, and best practice recommendations. A written personalized care plan for preventive services as well as general preventive health recommendations were provided to patient.     Lauree Chandler, NP   11/10/2020    Virtual Visit via Telephone Note  I connected with@ on 11/10/20 at  2:45 PM EST by telephone and verified that I am speaking with the correct person using two identifiers.  Location: Patient: home Provider: [facility]   I discussed the limitations, risks, security and privacy concerns of performing an evaluation and management service by telephone and the availability of in person appointments. I also discussed with the patient that there may be a patient responsible charge related to this service. The patient expressed understanding and agreed to proceed.   I discussed the assessment and treatment plan  with the patient. The patient was provided an opportunity to ask questions and all were answered. The patient agreed with the plan and demonstrated an understanding of the instructions.   The patient was advised to call back or seek an in-person evaluation if the symptoms worsen or if the condition fails to improve as anticipated.  I provided 18 minutes of  non-face-to-face time during this encounter.  Carlos American. Harle Battiest Avs printed and mailed

## 2020-11-15 DIAGNOSIS — L820 Inflamed seborrheic keratosis: Secondary | ICD-10-CM | POA: Diagnosis not present

## 2020-11-15 DIAGNOSIS — L821 Other seborrheic keratosis: Secondary | ICD-10-CM | POA: Diagnosis not present

## 2020-11-15 DIAGNOSIS — Z85828 Personal history of other malignant neoplasm of skin: Secondary | ICD-10-CM | POA: Diagnosis not present

## 2020-11-15 DIAGNOSIS — L570 Actinic keratosis: Secondary | ICD-10-CM | POA: Diagnosis not present

## 2020-11-15 DIAGNOSIS — L814 Other melanin hyperpigmentation: Secondary | ICD-10-CM | POA: Diagnosis not present

## 2020-12-05 ENCOUNTER — Ambulatory Visit (INDEPENDENT_AMBULATORY_CARE_PROVIDER_SITE_OTHER): Payer: Medicare Other | Admitting: Adult Health

## 2020-12-05 ENCOUNTER — Encounter: Payer: Self-pay | Admitting: Adult Health

## 2020-12-05 VITALS — BP 130/64 | HR 77 | Ht 65.0 in | Wt 149.0 lb

## 2020-12-05 DIAGNOSIS — R269 Unspecified abnormalities of gait and mobility: Secondary | ICD-10-CM

## 2020-12-05 DIAGNOSIS — G609 Hereditary and idiopathic neuropathy, unspecified: Secondary | ICD-10-CM

## 2020-12-05 NOTE — Progress Notes (Signed)
PATIENT: Jacqueline Dodson DOB: 02-10-1927  REASON FOR VISIT: follow up HISTORY FROM: patient  HISTORY OF PRESENT ILLNESS: Today 12/05/20:  [name] is a 87 year old female with a history of peripheral neuropathy.  She returns today for for follow-up.  She states overall her symptoms have remained stable.  She denies any discomfort in the legs.  She feels that gabapentin controls this.  She does feel that the numbness in her feet has gotten worse.  She uses a Rollator when ambulating but feels that her legs  Weak-reports that she lacks confidence with ambulation.  She specifically has trouble with longer distances.  Denies any recent falls.  Returns today for an evaluation.  2/24/21[name] is a 87 year old female with a history of neuropathy.  She returns today for follow-up.  She continues on gabapentin 100 mg at lunch and at bedtime.  Reports that this is managing her neuropathy.  She reports on occasion she will get burning and tingling in the thighs and then the arch of her feet.  She denies any changes with her gait or balance.  Continues to use a Rollator.  No falls.  Returns today for an evaluation.  HISTORY  06/01/19: [name] is a 87 year old female with a history of neuropathy.  She returns today for follow-up.  She states her neuropathy is under relatively good control.  She states about a month ago she had sharp pain in the right heel.  She states that this lasted 4 to 5 hours.  She is now taking gabapentin at noon and in the evening.  She reports that she is sleeping better.  She continues to use a Rollator.  She does feel weaker and more tired.  She did have a fall in June when she got up at night to go to the bathroom.  She states that the walker got ahead of her.  She continues to live at [facility] in independent living.  She returns today for an evaluation.  REVIEW OF SYSTEMS: Out of a complete 14 system review of symptoms, the patient complains only of the following symptoms, and  all other reviewed systems are negative.  ALLERGIES: Allergies  Allergen Reactions  . Amlodipine Other (See Comments)    Reaction:  Critically decreased sodium and potassium levels.    . Enalapril Other (See Comments)    Reaction:  Unknown   . Hctz [Hydrochlorothiazide] Other (See Comments)    Reaction:  Unknown   . Penicillins Other (See Comments)    Reaction:  Unknown   . Amoxicillin Swelling and Rash    HOME MEDICATIONS: Outpatient Medications Prior to Visit  Medication Sig Dispense Refill  . acetaminophen (TYLENOL) 500 MG tablet Take 500 mg by mouth as needed.    . calcium carbonate (TUMS - DOSED IN MG ELEMENTAL CALCIUM) 500 MG chewable tablet Chew 1 tablet by mouth as needed for indigestion or heartburn.     . cholecalciferol (VITAMIN D) 25 MCG (1000 UT) tablet Take 1,000 Units by mouth daily.    Marland Kitchen gabapentin (NEURONTIN) 100 MG capsule Take 1 capsule (100 mg total) by mouth 2 (two) times daily. 180 capsule 1  . LORazepam (ATIVAN) 0.5 MG tablet TAKE 1/2 TABLET AT BEDTIME. 30 tablet 0  . Multiple Vitamins-Minerals (PRESERVISION AREDS 2 PO) Take 1 tablet by mouth daily.    Marland Kitchen NIFEdipine (PROCARDIA XL/NIFEDICAL XL) 60 MG 24 hr tablet TAKE 1 TABLET DAILY FOR    BLOOD PRESSURE 90 tablet 1  . omeprazole (PRILOSEC  OTC) 20 MG tablet Take 20 mg by mouth every other day.     . Probiotic Product (PROBIOTIC ADVANCED PO) Take by mouth.     No facility-administered medications prior to visit.    PAST MEDICAL HISTORY: Past Medical History:  Diagnosis Date  . Abnormality of gait   . Acute, but ill-defined, cerebrovascular disease   . Anxiety   . Arthritis   . Chronic kidney disease, stage II (mild)   . Cystocele, midline   . Dermatophytosis of the body   . Disturbance of skin sensation   . Dizziness and giddiness   . Duodenal ulcer, unspecified as acute or chronic, without hemorrhage, perforation, or obstruction   . Edema   . Elevated blood pressure reading without diagnosis of  hypertension   . GERD (gastroesophageal reflux disease)   . Hemorrhoids   . Hypertension   . Left bundle branch hemiblock   . Lumbago   . Macular degeneration (senile) of retina, unspecified   . Myalgia and myositis, unspecified   . Orthostatic hypotension   . Other abnormal blood chemistry   . Other malaise and fatigue   . Personal history of fall   . Polyneuropathy in other diseases classified elsewhere ([place]) 02/16/2014  . Postmenopausal atrophic vaginitis   . 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 Rectocele   . Restless legs syndrome (RLS)   . Unspecified constipation   . Unspecified hereditary and idiopathic peripheral neuropathy   . Urgency of urination     PAST SURGICAL HISTORY: Past Surgical History:  Procedure Laterality Date  . ABDOMINAL HYSTERECTOMY  1972  . APPENDECTOMY  1945  . CATARACT EXTRACTION Bilateral   . EXTERNAL EAR SURGERY  2010   left outer ear basel cell removed  . TONSILLECTOMY  1940    FAMILY HISTORY: Family History  Problem Relation Age of Onset  . Aortic stenosis Mother   . Heart failure Mother   . Thrombosis Father   . Coronary artery disease Father   . Ataxia Sister   . Ataxia Brother   . Heart disease Brother   . Cancer Brother        Prostate  . Stroke Brother     SOCIAL HISTORY: Social History   Socioeconomic History  . Marital status: Widowed    Spouse name: Not on file  . Number of children: 5  . Years of education: college  . Highest education level: Not on file  Occupational History  . Occupation: Retired    Comment: Control and instrumentation engineer  Tobacco Use  . Smoking status: Never Smoker  . Smokeless tobacco: Never Used  Vaping Use  . Vaping Use: Never used  Substance and Sexual Activity  . Alcohol use: No    Alcohol/week: 0.0 standard drinks  . Drug use: No  . Sexual activity: Never  Other Topics Concern  . Not on file  Social History Narrative   Patient is widowed, with 5 children.   Patient is right handed.    Patient has college education.   Patient does not drink caffeine.   Social Determinants of Health   Financial Resource Strain: Not on file  Food Insecurity: Not on file  Transportation Needs: Not on file  Physical Activity: Not on file  Stress: Not on file  Social Connections: Not on file  Intimate Partner Violence: Not on file      PHYSICAL EXAM  Vitals:   12/05/20 1030  BP: 130/64  Pulse: 77  Weight: 149 lb (67.6 kg)  Height: 5\' 5"  (1.651 m)   Body mass index is 24.79 kg/m.  Generalized: Well developed, in no acute distress   Neurological examination  Mentation: Alert oriented to time, place, history taking. Follows all commands speech and language fluent Cranial nerve II-XII: Pupils were equal round reactive to light. Extraocular movements were full, visual field were full on confrontational test.. Head turning and shoulder shrug  were normal and symmetric. Motor: The motor testing reveals 5 over 5 strength of all 4 extremities. Good symmetric motor tone is noted throughout.  Sensory: Sensory testing is intact to soft touch on all 4 extremities. No evidence of extinction is noted.  Coordination: Cerebellar testing reveals good finger-nose-finger and heel-to-shin bilaterally.  Gait and station: Gait is normal.  Patient uses a Rollator when ambulating.  Tandem gait not attempted.   DIAGNOSTIC DATA (LABS, IMAGING, TESTING) - I reviewed patient records, labs, notes, testing and imaging myself where available.  Lab Results  Component Value Date   WBC 4.3 09/21/2019   HGB 10.7 (L) 09/21/2019   HCT 32.3 (L) 09/21/2019   MCV 89.5 09/21/2019   PLT 147 09/21/2019      Component Value Date/Time   NA 138 08/24/2019 1425   NA 141 11/27/2018 0000   K 3.9 08/24/2019 1425   CL 109 08/24/2019 1425   CL 108 11/27/2018 0000   CO2 21 (L) 08/24/2019 1425   CO2 24 11/27/2018 0000   GLUCOSE 106 (H) 08/24/2019 1425   BUN 30 (H) 08/24/2019 1425   BUN 32 (A) 11/27/2018 0000    CREATININE 1.69 (H) 08/24/2019 1425   CREATININE 1.64 (H) 08/18/2019 0705   CALCIUM 9.2 08/24/2019 1425   CALCIUM 9.4 11/27/2018 0000   PROT 6.5 08/18/2019 0705   ALBUMIN 4.0 11/27/2018 0000   AST 16 08/18/2019 0705   ALT 12 08/18/2019 0705   ALKPHOS 55 02/11/2017 1030   BILITOT 0.4 08/18/2019 0705   GFRNONAA 26 (L) 08/24/2019 1425   GFRNONAA 27 (L) 08/18/2019 0705   GFRAA 30 (L) 08/24/2019 1425   GFRAA 32 (L) 08/18/2019 0705   Lab Results  Component Value Date   CHOL 213 (H) 08/18/2019   HDL 34 (L) 08/18/2019   LDLCALC 150 (H) 08/18/2019   TRIG 155 (H) 08/18/2019   CHOLHDL 6.3 (H) 08/18/2019   Lab Results  Component Value Date   HGBA1C 5.5 08/18/2019   Lab Results  Component Value Date   VITAMINB12 427 09/21/2019   Lab Results  Component Value Date   TSH 3.83 10/07/2018      ASSESSMENT AND PLAN 87 y.o. year old female  has a past medical history of Abnormality of gait, Acute, but ill-defined, cerebrovascular disease, Anxiety, Arthritis, Chronic kidney disease, stage II (mild), Cystocele, midline, Dermatophytosis of the body, Disturbance of skin sensation, Dizziness and giddiness, Duodenal ulcer, unspecified as acute or chronic, without hemorrhage, perforation, or obstruction, Edema, Elevated blood pressure reading without diagnosis of hypertension, GERD (gastroesophageal reflux disease), Hemorrhoids, Hypertension, Left bundle branch hemiblock, Lumbago, Macular degeneration (senile) of retina, unspecified, Myalgia and myositis, unspecified, Orthostatic hypotension, Other abnormal blood chemistry, Other malaise and fatigue, Personal history of fall, Polyneuropathy in other diseases classified elsewhere ([place]) (02/16/2014), Postmenopausal atrophic vaginitis, Primary cerebellar degeneration (HCC), Rectocele, Restless legs syndrome (RLS), Unspecified constipation, Unspecified hereditary and idiopathic peripheral neuropathy, and Urgency of urination. here with:  1.  Peripheral  neuropathy   -Continue gabapentin 100 mg twice a day  2.  Abnormality of gait  -Referral for physical therapy--at friends  home [place] if symptoms worsen or she develops new symptoms she should let us know.  -Follow-up in 1 year or sooner if needed.   I spent 30 minutes of face-to-face and non-face-to-face time with patient.  This included previsit chart review, lab review, study review, order entry, electronic health record documentation, patient education.    Ward Givens, MSN, NP-C 12/05/2020, 10:44 AM [facility] [address] [place], [place] [address] [phone]

## 2020-12-05 NOTE — Patient Instructions (Signed)
Your Plan:  Continue Gabapentin 100 mg twice a day Referral to physical therapy If your symptoms worsen or you develop new symptoms please let us know.   Thank you for coming to see [place] at [facility]. I hope we have been able to provide you high quality care today.  You may receive a patient satisfaction survey over the next few weeks. We would appreciate your feedback and comments so that we may continue to improve ourselves and the health of our patients.

## 2020-12-20 DIAGNOSIS — N3946 Mixed incontinence: Secondary | ICD-10-CM | POA: Diagnosis not present

## 2020-12-20 DIAGNOSIS — R29818 Other symptoms and signs involving the nervous system: Secondary | ICD-10-CM | POA: Diagnosis not present

## 2020-12-20 DIAGNOSIS — R29898 Other symptoms and signs involving the musculoskeletal system: Secondary | ICD-10-CM | POA: Diagnosis not present

## 2020-12-21 DIAGNOSIS — N3946 Mixed incontinence: Secondary | ICD-10-CM | POA: Diagnosis not present

## 2020-12-21 DIAGNOSIS — R29818 Other symptoms and signs involving the nervous system: Secondary | ICD-10-CM | POA: Diagnosis not present

## 2020-12-21 DIAGNOSIS — R29898 Other symptoms and signs involving the musculoskeletal system: Secondary | ICD-10-CM | POA: Diagnosis not present

## 2020-12-23 DIAGNOSIS — R29898 Other symptoms and signs involving the musculoskeletal system: Secondary | ICD-10-CM | POA: Diagnosis not present

## 2020-12-23 DIAGNOSIS — R29818 Other symptoms and signs involving the nervous system: Secondary | ICD-10-CM | POA: Diagnosis not present

## 2020-12-23 DIAGNOSIS — N3946 Mixed incontinence: Secondary | ICD-10-CM | POA: Diagnosis not present

## 2020-12-27 ENCOUNTER — Other Ambulatory Visit: Payer: Self-pay | Admitting: Internal Medicine

## 2020-12-27 DIAGNOSIS — R29818 Other symptoms and signs involving the nervous system: Secondary | ICD-10-CM | POA: Diagnosis not present

## 2020-12-27 DIAGNOSIS — N3946 Mixed incontinence: Secondary | ICD-10-CM | POA: Diagnosis not present

## 2020-12-27 DIAGNOSIS — R29898 Other symptoms and signs involving the musculoskeletal system: Secondary | ICD-10-CM | POA: Diagnosis not present

## 2020-12-27 NOTE — Telephone Encounter (Signed)
High risk or very high risk warning populated when attempting to refill medication. RX request sent to PCP for review and approval if warranted.   

## 2020-12-30 DIAGNOSIS — N3946 Mixed incontinence: Secondary | ICD-10-CM | POA: Diagnosis not present

## 2020-12-30 DIAGNOSIS — R29818 Other symptoms and signs involving the nervous system: Secondary | ICD-10-CM | POA: Diagnosis not present

## 2020-12-30 DIAGNOSIS — R29898 Other symptoms and signs involving the musculoskeletal system: Secondary | ICD-10-CM | POA: Diagnosis not present

## 2021-01-03 DIAGNOSIS — R29818 Other symptoms and signs involving the nervous system: Secondary | ICD-10-CM | POA: Diagnosis not present

## 2021-01-03 DIAGNOSIS — R29898 Other symptoms and signs involving the musculoskeletal system: Secondary | ICD-10-CM | POA: Diagnosis not present

## 2021-01-03 DIAGNOSIS — N3946 Mixed incontinence: Secondary | ICD-10-CM | POA: Diagnosis not present

## 2021-01-05 ENCOUNTER — Other Ambulatory Visit: Payer: Self-pay | Admitting: Nurse Practitioner

## 2021-01-05 DIAGNOSIS — R2689 Other abnormalities of gait and mobility: Secondary | ICD-10-CM | POA: Diagnosis not present

## 2021-01-05 DIAGNOSIS — F419 Anxiety disorder, unspecified: Secondary | ICD-10-CM

## 2021-01-05 DIAGNOSIS — R2681 Unsteadiness on feet: Secondary | ICD-10-CM | POA: Diagnosis not present

## 2021-01-05 DIAGNOSIS — M6281 Muscle weakness (generalized): Secondary | ICD-10-CM | POA: Diagnosis not present

## 2021-01-05 DIAGNOSIS — R29818 Other symptoms and signs involving the nervous system: Secondary | ICD-10-CM | POA: Diagnosis not present

## 2021-01-05 DIAGNOSIS — R29898 Other symptoms and signs involving the musculoskeletal system: Secondary | ICD-10-CM | POA: Diagnosis not present

## 2021-01-05 DIAGNOSIS — G9009 Other idiopathic peripheral autonomic neuropathy: Secondary | ICD-10-CM | POA: Diagnosis not present

## 2021-01-05 NOTE — Telephone Encounter (Signed)
Pended and sent to Man X Mast, NP for approval

## 2021-01-06 DIAGNOSIS — M6281 Muscle weakness (generalized): Secondary | ICD-10-CM | POA: Diagnosis not present

## 2021-01-06 DIAGNOSIS — R2681 Unsteadiness on feet: Secondary | ICD-10-CM | POA: Diagnosis not present

## 2021-01-06 DIAGNOSIS — R29818 Other symptoms and signs involving the nervous system: Secondary | ICD-10-CM | POA: Diagnosis not present

## 2021-01-06 DIAGNOSIS — G9009 Other idiopathic peripheral autonomic neuropathy: Secondary | ICD-10-CM | POA: Diagnosis not present

## 2021-01-06 DIAGNOSIS — R29898 Other symptoms and signs involving the musculoskeletal system: Secondary | ICD-10-CM | POA: Diagnosis not present

## 2021-01-06 DIAGNOSIS — R2689 Other abnormalities of gait and mobility: Secondary | ICD-10-CM | POA: Diagnosis not present

## 2021-01-10 DIAGNOSIS — M6281 Muscle weakness (generalized): Secondary | ICD-10-CM | POA: Diagnosis not present

## 2021-01-10 DIAGNOSIS — G9009 Other idiopathic peripheral autonomic neuropathy: Secondary | ICD-10-CM | POA: Diagnosis not present

## 2021-01-10 DIAGNOSIS — R29898 Other symptoms and signs involving the musculoskeletal system: Secondary | ICD-10-CM | POA: Diagnosis not present

## 2021-01-10 DIAGNOSIS — R2689 Other abnormalities of gait and mobility: Secondary | ICD-10-CM | POA: Diagnosis not present

## 2021-01-10 DIAGNOSIS — R29818 Other symptoms and signs involving the nervous system: Secondary | ICD-10-CM | POA: Diagnosis not present

## 2021-01-10 DIAGNOSIS — R2681 Unsteadiness on feet: Secondary | ICD-10-CM | POA: Diagnosis not present

## 2021-01-12 DIAGNOSIS — R29898 Other symptoms and signs involving the musculoskeletal system: Secondary | ICD-10-CM | POA: Diagnosis not present

## 2021-01-12 DIAGNOSIS — R2681 Unsteadiness on feet: Secondary | ICD-10-CM | POA: Diagnosis not present

## 2021-01-12 DIAGNOSIS — R29818 Other symptoms and signs involving the nervous system: Secondary | ICD-10-CM | POA: Diagnosis not present

## 2021-01-12 DIAGNOSIS — M6281 Muscle weakness (generalized): Secondary | ICD-10-CM | POA: Diagnosis not present

## 2021-01-12 DIAGNOSIS — R2689 Other abnormalities of gait and mobility: Secondary | ICD-10-CM | POA: Diagnosis not present

## 2021-01-12 DIAGNOSIS — G9009 Other idiopathic peripheral autonomic neuropathy: Secondary | ICD-10-CM | POA: Diagnosis not present

## 2021-01-13 DIAGNOSIS — R29818 Other symptoms and signs involving the nervous system: Secondary | ICD-10-CM | POA: Diagnosis not present

## 2021-01-13 DIAGNOSIS — M6281 Muscle weakness (generalized): Secondary | ICD-10-CM | POA: Diagnosis not present

## 2021-01-13 DIAGNOSIS — R29898 Other symptoms and signs involving the musculoskeletal system: Secondary | ICD-10-CM | POA: Diagnosis not present

## 2021-01-13 DIAGNOSIS — R2681 Unsteadiness on feet: Secondary | ICD-10-CM | POA: Diagnosis not present

## 2021-01-13 DIAGNOSIS — R2689 Other abnormalities of gait and mobility: Secondary | ICD-10-CM | POA: Diagnosis not present

## 2021-01-13 DIAGNOSIS — G9009 Other idiopathic peripheral autonomic neuropathy: Secondary | ICD-10-CM | POA: Diagnosis not present

## 2021-01-17 DIAGNOSIS — L820 Inflamed seborrheic keratosis: Secondary | ICD-10-CM | POA: Diagnosis not present

## 2021-01-17 DIAGNOSIS — Z85828 Personal history of other malignant neoplasm of skin: Secondary | ICD-10-CM | POA: Diagnosis not present

## 2021-01-17 DIAGNOSIS — L570 Actinic keratosis: Secondary | ICD-10-CM | POA: Diagnosis not present

## 2021-01-19 DIAGNOSIS — R29898 Other symptoms and signs involving the musculoskeletal system: Secondary | ICD-10-CM | POA: Diagnosis not present

## 2021-01-19 DIAGNOSIS — R2689 Other abnormalities of gait and mobility: Secondary | ICD-10-CM | POA: Diagnosis not present

## 2021-01-19 DIAGNOSIS — G9009 Other idiopathic peripheral autonomic neuropathy: Secondary | ICD-10-CM | POA: Diagnosis not present

## 2021-01-19 DIAGNOSIS — R29818 Other symptoms and signs involving the nervous system: Secondary | ICD-10-CM | POA: Diagnosis not present

## 2021-01-19 DIAGNOSIS — M6281 Muscle weakness (generalized): Secondary | ICD-10-CM | POA: Diagnosis not present

## 2021-01-19 DIAGNOSIS — R2681 Unsteadiness on feet: Secondary | ICD-10-CM | POA: Diagnosis not present

## 2021-01-20 DIAGNOSIS — R29818 Other symptoms and signs involving the nervous system: Secondary | ICD-10-CM | POA: Diagnosis not present

## 2021-01-20 DIAGNOSIS — R2689 Other abnormalities of gait and mobility: Secondary | ICD-10-CM | POA: Diagnosis not present

## 2021-01-20 DIAGNOSIS — R2681 Unsteadiness on feet: Secondary | ICD-10-CM | POA: Diagnosis not present

## 2021-01-20 DIAGNOSIS — M6281 Muscle weakness (generalized): Secondary | ICD-10-CM | POA: Diagnosis not present

## 2021-01-20 DIAGNOSIS — R29898 Other symptoms and signs involving the musculoskeletal system: Secondary | ICD-10-CM | POA: Diagnosis not present

## 2021-01-20 DIAGNOSIS — G9009 Other idiopathic peripheral autonomic neuropathy: Secondary | ICD-10-CM | POA: Diagnosis not present

## 2021-01-24 DIAGNOSIS — M6281 Muscle weakness (generalized): Secondary | ICD-10-CM | POA: Diagnosis not present

## 2021-01-24 DIAGNOSIS — G9009 Other idiopathic peripheral autonomic neuropathy: Secondary | ICD-10-CM | POA: Diagnosis not present

## 2021-01-24 DIAGNOSIS — R29818 Other symptoms and signs involving the nervous system: Secondary | ICD-10-CM | POA: Diagnosis not present

## 2021-01-24 DIAGNOSIS — R29898 Other symptoms and signs involving the musculoskeletal system: Secondary | ICD-10-CM | POA: Diagnosis not present

## 2021-01-24 DIAGNOSIS — R2689 Other abnormalities of gait and mobility: Secondary | ICD-10-CM | POA: Diagnosis not present

## 2021-01-24 DIAGNOSIS — R2681 Unsteadiness on feet: Secondary | ICD-10-CM | POA: Diagnosis not present

## 2021-01-27 DIAGNOSIS — G9009 Other idiopathic peripheral autonomic neuropathy: Secondary | ICD-10-CM | POA: Diagnosis not present

## 2021-01-27 DIAGNOSIS — M6281 Muscle weakness (generalized): Secondary | ICD-10-CM | POA: Diagnosis not present

## 2021-01-27 DIAGNOSIS — R2681 Unsteadiness on feet: Secondary | ICD-10-CM | POA: Diagnosis not present

## 2021-01-27 DIAGNOSIS — R2689 Other abnormalities of gait and mobility: Secondary | ICD-10-CM | POA: Diagnosis not present

## 2021-01-27 DIAGNOSIS — R29898 Other symptoms and signs involving the musculoskeletal system: Secondary | ICD-10-CM | POA: Diagnosis not present

## 2021-01-27 DIAGNOSIS — R29818 Other symptoms and signs involving the nervous system: Secondary | ICD-10-CM | POA: Diagnosis not present

## 2021-01-31 DIAGNOSIS — R29818 Other symptoms and signs involving the nervous system: Secondary | ICD-10-CM | POA: Diagnosis not present

## 2021-01-31 DIAGNOSIS — R2689 Other abnormalities of gait and mobility: Secondary | ICD-10-CM | POA: Diagnosis not present

## 2021-01-31 DIAGNOSIS — G9009 Other idiopathic peripheral autonomic neuropathy: Secondary | ICD-10-CM | POA: Diagnosis not present

## 2021-01-31 DIAGNOSIS — M6281 Muscle weakness (generalized): Secondary | ICD-10-CM | POA: Diagnosis not present

## 2021-01-31 DIAGNOSIS — R2681 Unsteadiness on feet: Secondary | ICD-10-CM | POA: Diagnosis not present

## 2021-01-31 DIAGNOSIS — R29898 Other symptoms and signs involving the musculoskeletal system: Secondary | ICD-10-CM | POA: Diagnosis not present

## 2021-02-02 DIAGNOSIS — G9009 Other idiopathic peripheral autonomic neuropathy: Secondary | ICD-10-CM | POA: Diagnosis not present

## 2021-02-02 DIAGNOSIS — M6281 Muscle weakness (generalized): Secondary | ICD-10-CM | POA: Diagnosis not present

## 2021-02-02 DIAGNOSIS — R29818 Other symptoms and signs involving the nervous system: Secondary | ICD-10-CM | POA: Diagnosis not present

## 2021-02-02 DIAGNOSIS — R2689 Other abnormalities of gait and mobility: Secondary | ICD-10-CM | POA: Diagnosis not present

## 2021-02-02 DIAGNOSIS — R29898 Other symptoms and signs involving the musculoskeletal system: Secondary | ICD-10-CM | POA: Diagnosis not present

## 2021-02-02 DIAGNOSIS — R2681 Unsteadiness on feet: Secondary | ICD-10-CM | POA: Diagnosis not present

## 2021-02-07 DIAGNOSIS — N189 Chronic kidney disease, unspecified: Secondary | ICD-10-CM | POA: Diagnosis not present

## 2021-02-07 DIAGNOSIS — N2581 Secondary hyperparathyroidism of renal origin: Secondary | ICD-10-CM | POA: Diagnosis not present

## 2021-02-07 DIAGNOSIS — D631 Anemia in chronic kidney disease: Secondary | ICD-10-CM | POA: Diagnosis not present

## 2021-02-07 DIAGNOSIS — I129 Hypertensive chronic kidney disease with stage 1 through stage 4 chronic kidney disease, or unspecified chronic kidney disease: Secondary | ICD-10-CM | POA: Diagnosis not present

## 2021-02-07 DIAGNOSIS — N1832 Chronic kidney disease, stage 3b: Secondary | ICD-10-CM | POA: Diagnosis not present

## 2021-02-08 ENCOUNTER — Other Ambulatory Visit: Payer: Self-pay | Admitting: Nurse Practitioner

## 2021-02-08 DIAGNOSIS — N184 Chronic kidney disease, stage 4 (severe): Secondary | ICD-10-CM

## 2021-02-09 DIAGNOSIS — R29898 Other symptoms and signs involving the musculoskeletal system: Secondary | ICD-10-CM | POA: Diagnosis not present

## 2021-02-09 DIAGNOSIS — M6281 Muscle weakness (generalized): Secondary | ICD-10-CM | POA: Diagnosis not present

## 2021-02-09 DIAGNOSIS — R29818 Other symptoms and signs involving the nervous system: Secondary | ICD-10-CM | POA: Diagnosis not present

## 2021-02-09 DIAGNOSIS — R2681 Unsteadiness on feet: Secondary | ICD-10-CM | POA: Diagnosis not present

## 2021-02-09 DIAGNOSIS — R2689 Other abnormalities of gait and mobility: Secondary | ICD-10-CM | POA: Diagnosis not present

## 2021-02-09 DIAGNOSIS — G9009 Other idiopathic peripheral autonomic neuropathy: Secondary | ICD-10-CM | POA: Diagnosis not present

## 2021-02-28 DIAGNOSIS — R2689 Other abnormalities of gait and mobility: Secondary | ICD-10-CM | POA: Diagnosis not present

## 2021-02-28 DIAGNOSIS — R29818 Other symptoms and signs involving the nervous system: Secondary | ICD-10-CM | POA: Diagnosis not present

## 2021-02-28 DIAGNOSIS — R2681 Unsteadiness on feet: Secondary | ICD-10-CM | POA: Diagnosis not present

## 2021-02-28 DIAGNOSIS — M6281 Muscle weakness (generalized): Secondary | ICD-10-CM | POA: Diagnosis not present

## 2021-02-28 DIAGNOSIS — G9009 Other idiopathic peripheral autonomic neuropathy: Secondary | ICD-10-CM | POA: Diagnosis not present

## 2021-02-28 DIAGNOSIS — R29898 Other symptoms and signs involving the musculoskeletal system: Secondary | ICD-10-CM | POA: Diagnosis not present

## 2021-03-01 ENCOUNTER — Other Ambulatory Visit: Payer: Self-pay | Admitting: Nurse Practitioner

## 2021-03-01 DIAGNOSIS — F419 Anxiety disorder, unspecified: Secondary | ICD-10-CM

## 2021-03-01 NOTE — Telephone Encounter (Signed)
RX last filled on 01/05/2021. No treatment agreement on file. No pending appointment, notation made on RX appointment is overdue

## 2021-03-02 DIAGNOSIS — R2681 Unsteadiness on feet: Secondary | ICD-10-CM | POA: Diagnosis not present

## 2021-03-02 DIAGNOSIS — R2689 Other abnormalities of gait and mobility: Secondary | ICD-10-CM | POA: Diagnosis not present

## 2021-03-02 DIAGNOSIS — M6281 Muscle weakness (generalized): Secondary | ICD-10-CM | POA: Diagnosis not present

## 2021-03-02 DIAGNOSIS — R29818 Other symptoms and signs involving the nervous system: Secondary | ICD-10-CM | POA: Diagnosis not present

## 2021-03-02 DIAGNOSIS — R29898 Other symptoms and signs involving the musculoskeletal system: Secondary | ICD-10-CM | POA: Diagnosis not present

## 2021-03-02 DIAGNOSIS — G9009 Other idiopathic peripheral autonomic neuropathy: Secondary | ICD-10-CM | POA: Diagnosis not present

## 2021-03-03 DIAGNOSIS — R2689 Other abnormalities of gait and mobility: Secondary | ICD-10-CM | POA: Diagnosis not present

## 2021-03-03 DIAGNOSIS — M6281 Muscle weakness (generalized): Secondary | ICD-10-CM | POA: Diagnosis not present

## 2021-03-03 DIAGNOSIS — R2681 Unsteadiness on feet: Secondary | ICD-10-CM | POA: Diagnosis not present

## 2021-03-03 DIAGNOSIS — R29898 Other symptoms and signs involving the musculoskeletal system: Secondary | ICD-10-CM | POA: Diagnosis not present

## 2021-03-03 DIAGNOSIS — R29818 Other symptoms and signs involving the nervous system: Secondary | ICD-10-CM | POA: Diagnosis not present

## 2021-03-03 DIAGNOSIS — G9009 Other idiopathic peripheral autonomic neuropathy: Secondary | ICD-10-CM | POA: Diagnosis not present

## 2021-03-16 DIAGNOSIS — L820 Inflamed seborrheic keratosis: Secondary | ICD-10-CM | POA: Diagnosis not present

## 2021-03-16 DIAGNOSIS — L738 Other specified follicular disorders: Secondary | ICD-10-CM | POA: Diagnosis not present

## 2021-03-16 DIAGNOSIS — Z85828 Personal history of other malignant neoplasm of skin: Secondary | ICD-10-CM | POA: Diagnosis not present

## 2021-03-16 DIAGNOSIS — L570 Actinic keratosis: Secondary | ICD-10-CM | POA: Diagnosis not present

## 2021-04-03 ENCOUNTER — Other Ambulatory Visit: Payer: Self-pay | Admitting: Nurse Practitioner

## 2021-04-03 DIAGNOSIS — F419 Anxiety disorder, unspecified: Secondary | ICD-10-CM

## 2021-04-03 NOTE — Telephone Encounter (Addendum)
RX last filled on 03/01/2021  No treatment agreement on file and no pending appointment with [facility] scheduled.   I called patient and scheduled her for a routine follow-up for this Thursday and indicated in the appt notes to have patient sign a treatment agreement

## 2021-04-06 ENCOUNTER — Encounter: Payer: Self-pay | Admitting: Nurse Practitioner

## 2021-04-06 ENCOUNTER — Other Ambulatory Visit: Payer: Self-pay

## 2021-04-06 ENCOUNTER — Telehealth: Payer: Self-pay

## 2021-04-06 ENCOUNTER — Non-Acute Institutional Stay: Payer: Medicare Other | Admitting: Nurse Practitioner

## 2021-04-06 DIAGNOSIS — I10 Essential (primary) hypertension: Secondary | ICD-10-CM | POA: Diagnosis not present

## 2021-04-06 DIAGNOSIS — F411 Generalized anxiety disorder: Secondary | ICD-10-CM

## 2021-04-06 DIAGNOSIS — G63 Polyneuropathy in diseases classified elsewhere: Secondary | ICD-10-CM

## 2021-04-06 DIAGNOSIS — N184 Chronic kidney disease, stage 4 (severe): Secondary | ICD-10-CM

## 2021-04-06 DIAGNOSIS — K219 Gastro-esophageal reflux disease without esophagitis: Secondary | ICD-10-CM

## 2021-04-06 DIAGNOSIS — N8110 Cystocele, unspecified: Secondary | ICD-10-CM | POA: Diagnosis not present

## 2021-04-06 DIAGNOSIS — K623 Rectal prolapse: Secondary | ICD-10-CM

## 2021-04-06 MED ORDER — LORAZEPAM 0.5 MG PO TABS: 0.2500 mg | ORAL_TABLET | Freq: Every day | ORAL | 1 refills | Status: DC

## 2021-04-06 MED ORDER — ZINC OXIDE 13 % EX CREA: TOPICAL_OINTMENT | Freq: Two times a day (BID) | CUTANEOUS | 2 refills | Status: DC | PRN

## 2021-04-06 NOTE — Assessment & Plan Note (Signed)
Blood pressure is controlled, continue Nifedipine.

## 2021-04-06 NOTE — Assessment & Plan Note (Signed)
Peripheral neuropathy in BLE, stable, on Gabapentin 100mg  bid.

## 2021-04-06 NOTE — Assessment & Plan Note (Signed)
Grade 3,  incontinent, grade 3, bladder bulges out the opening the vagina. May use prn Hydrocortisone cream for irritation.

## 2021-04-06 NOTE — Assessment & Plan Note (Signed)
Last seen Nephrology in 01/2021, will request lab results.

## 2021-04-06 NOTE — Telephone Encounter (Signed)
See incoming message below from [facility], more information is needed from the provider to process rx sent in for mupirocin:

## 2021-04-06 NOTE — Progress Notes (Signed)
Location:   clinic [place]   Place of Service:  Clinic (12) Provider: Marlana Latus NP  Code Status: DNR Goals of Care: [place] 04/06/2021  Does Patient Have a Medical Advance Directive? Yes  Type of Paramedic of [place];Living will  Does patient want to make changes to medical advance directive? No - Patient declined  Copy of [place] in Chart? Yes - validated most recent copy scanned in chart (See row information)  Would patient like information on creating a medical advance directive? -     Chief Complaint  Patient presents with   Follow-up    Routine follow up. Patient complains of fatigue.     HPI: Patient is a 87 y.o. female seen today for medical management of chronic diseases.     HTN, blood pressure is controlled, off Telmisartan 80mg  qd,  Labetalol 100mg  qd, on Nifedipine 60mg  qd.  GERD, stable, on Omeprazole 20mg  qod.  Her mood is stable, on Lorazepam 0.25mg  qhs. Peripheral neuropathy in BLE, stable, on Gabapentin 100mg  bid. Cystocele, incontinent, grade 3, bladder bulges out the opening the vagina.  Hemorrhoids/rectal mucosa prolapse, bleeding on and off.  CKD stage 4, f/u Nephrology.   Past Medical History:  Diagnosis Date   Abnormality of gait    Acute, but ill-defined, cerebrovascular disease    Anxiety    Arthritis    Chronic kidney disease, stage II (mild)    Cystocele, midline    Dermatophytosis of the body    Disturbance of skin sensation    Dizziness and giddiness    Duodenal ulcer, unspecified as acute or chronic, without hemorrhage, perforation, or obstruction    Edema    Elevated blood pressure reading without diagnosis of hypertension    GERD (gastroesophageal reflux disease)    Hemorrhoids    Hypertension    Left bundle branch hemiblock    Lumbago    Macular degeneration (senile) of retina, unspecified    Myalgia and myositis, unspecified    Orthostatic hypotension    Other  abnormal blood chemistry    Other malaise and fatigue    Personal history of fall    Polyneuropathy in other diseases classified elsewhere ([place]) 02/16/2014   Postmenopausal atrophic vaginitis    Primary cerebellar degeneration (HCC)    Rectocele    Restless legs syndrome (RLS)    Unspecified constipation    Unspecified hereditary and idiopathic peripheral neuropathy    Urgency of urination     Past Surgical History:  Procedure Laterality Date   ABDOMINAL HYSTERECTOMY  1972   APPENDECTOMY  1945   CATARACT EXTRACTION Bilateral    EXTERNAL EAR SURGERY  2010   left outer ear basel cell removed   TONSILLECTOMY  1940    Allergies  Allergen Reactions   Amlodipine Other (See Comments)    Reaction:  Critically decreased sodium and potassium levels.     Enalapril Other (See Comments)    Reaction:  Unknown    Hctz [Hydrochlorothiazide] Other (See Comments)    Reaction:  Unknown    Penicillins Other (See Comments)    Reaction:  Unknown    Amoxicillin Swelling and Rash    Allergies as of 04/06/2021       Reactions   Amlodipine Other (See Comments)   Reaction:  Critically decreased sodium and potassium levels.     Enalapril Other (See Comments)   Reaction:  Unknown    Hctz [hydrochlorothiazide] Other (See Comments)   Reaction:  Unknown    Penicillins Other (See Comments)   Reaction:  Unknown    Amoxicillin Swelling, Rash        Medication List        Accurate as of April 06, 2021  2:56 PM. If you have any questions, ask your nurse or doctor.          acetaminophen 500 MG tablet Commonly known as: TYLENOL Take 500 mg by mouth as needed.   calcium carbonate 500 MG chewable tablet Commonly known as: TUMS - dosed in mg elemental calcium Chew 1 tablet by mouth as needed for indigestion or heartburn.   cholecalciferol 25 MCG (1000 UNIT) tablet Commonly known as: VITAMIN D Take 1,000 Units by mouth daily.   gabapentin 100 MG capsule Commonly known as:  NEURONTIN Take 1 capsule (100 mg total) by mouth 2 (two) times daily.   LORazepam 0.5 MG tablet Commonly known as: ATIVAN Take 0.5 tablets (0.25 mg total) by mouth at bedtime. What changed: additional instructions Changed by: Yarissa Reining X Nivin Braniff, NP   mupirocin 2% oint-hydrocortisone 2.5% cream-nystatin cream-zinc oxide 13% oint 1:1:1:5 mixture Apply topically 2 (two) times daily as needed for irritation (irritated prolapsed rectal mucosa and prolapsed urinary bladder). Started by: Kimball Appleby X An Lannan, NP   NIFEdipine 60 MG 24 hr tablet Commonly known as: PROCARDIA XL/NIFEDICAL XL TAKE 1 TABLET DAILY FOR    BLOOD PRESSURE   omeprazole 20 MG tablet Commonly known as: PRILOSEC OTC Take 20 mg by mouth every other day.   PRESERVISION AREDS 2 PO Take 1 tablet by mouth daily.   PROBIOTIC ADVANCED PO Take by mouth.        Review of Systems:  Review of Systems  Constitutional:  Negative for fatigue, fever and unexpected weight change.  HENT:  Positive for hearing loss. Negative for congestion and voice change.   Eyes:  Negative for visual disturbance.  Respiratory:  Negative for cough and shortness of breath.   Cardiovascular:  Negative for leg swelling.  Gastrointestinal:  Negative for abdominal pain and constipation.       Alternating constipation, diarrhea if taking Laxative.   Genitourinary:  Negative for difficulty urinating, dysuria and urgency.       2-3x/night bathroom trips.   Musculoskeletal:  Positive for arthralgias and gait problem.  Skin:  Negative for color change.  Neurological:  Negative for speech difficulty, weakness and light-headedness.  Psychiatric/Behavioral:  Negative for behavioral problems and sleep disturbance. The patient is not nervous/anxious.    Health Maintenance  Topic Date Due   FOOT EXAM  Never done   URINE MICROALBUMIN  Never done   Zoster Vaccines- Shingrix (1 of 2) Never done   HEMOGLOBIN A1C  02/15/2020   COVID-19 Vaccine (4 - Booster for Moderna  series) 12/14/2020   INFLUENZA VACCINE  05/08/2021   OPHTHALMOLOGY EXAM  07/26/2021   TETANUS/TDAP  09/04/2027   PNA vac Low Risk Adult  Completed   HPV VACCINES  Aged Out   DEXA SCAN  Discontinued    Physical Exam: Vitals:   04/06/21 1359  BP: 136/62  Pulse: 75  Resp: 18  Temp: 98.2 F (36.8 C)  SpO2: 97%  Weight: 143 lb 9.6 oz (65.1 kg)  Height: 5\' 5"  (1.651 m)   Body mass index is 23.9 kg/m. Physical Exam Vitals and nursing note reviewed.  Constitutional:      Appearance: Normal appearance. She is normal weight.  HENT:     Head: Normocephalic and atraumatic.  Nose: Nose normal.     Mouth/Throat:     Mouth: Mucous membranes are moist.  Eyes:     Extraocular Movements: Extraocular movements intact.     Conjunctiva/sclera: Conjunctivae normal.     Pupils: Pupils are equal, round, and reactive to light.  Cardiovascular:     Rate and Rhythm: Normal rate and regular rhythm.     Heart sounds: Murmur heard.  Pulmonary:     Breath sounds: No rales.  Abdominal:     General: Bowel sounds are normal.     Palpations: Abdomen is soft.     Tenderness: There is no abdominal tenderness.  Genitourinary:    Comments: Complete cystocele, rectal mucosa prolapse, irritated Musculoskeletal:     Cervical back: Normal range of motion and neck supple.     Right lower leg: No edema.     Left lower leg: No edema.  Skin:    General: Skin is warm and dry.  Neurological:     General: No focal deficit present.     Mental Status: She is alert and oriented to person, place, and time. Mental status is at baseline.     Motor: No weakness.     Coordination: Coordination normal.     Gait: Gait abnormal.  Psychiatric:        Mood and Affect: Mood normal.        Behavior: Behavior normal.        Thought Content: Thought content normal.        Judgment: Judgment normal.    Labs reviewed: Basic Metabolic Panel: No results for input(s): NA, K, CL, CO2, GLUCOSE, BUN, CREATININE,  CALCIUM, MG, PHOS, TSH in the last 8760 hours. Liver Function Tests: No results for input(s): AST, ALT, ALKPHOS, BILITOT, PROT, ALBUMIN in the last 8760 hours. No results for input(s): LIPASE, AMYLASE in the last 8760 hours. No results for input(s): AMMONIA in the last 8760 hours. CBC: No results for input(s): WBC, NEUTROABS, HGB, HCT, MCV, PLT in the last 8760 hours. Lipid Panel: No results for input(s): CHOL, HDL, LDLCALC, TRIG, CHOLHDL, LDLDIRECT in the last 8760 hours. Lab Results  Component Value Date   HGBA1C 5.5 08/18/2019    Procedures since last visit: No results found.  Assessment/Plan  Rectal mucosa prolapse Hemorrhoids/rectal mucosa prolapse, bleeding on and off. Will apply 2.5% Hydrocortisone cream bid prn.   Cystocele, unspecified (CODE) Grade 3,  incontinent, grade 3, bladder bulges out the opening the vagina. May use prn Hydrocortisone cream for irritation.   Generalized anxiety disorder Continue Lorazepam 0.25mg  hs.   Peripheral neuropathy Peripheral neuropathy in BLE, stable, on Gabapentin 100mg  bid.  GERD (gastroesophageal reflux disease) stable, on Omeprazole 20mg  qod.   HTN (hypertension) Blood pressure is controlled, continue Nifedipine.    Labs/tests ordered: none  Next appt:  6 months.

## 2021-04-06 NOTE — Assessment & Plan Note (Signed)
Hemorrhoids/rectal mucosa prolapse, bleeding on and off. Will apply 2.5% Hydrocortisone cream bid prn.

## 2021-04-06 NOTE — Assessment & Plan Note (Signed)
Continue Lorazepam 0.25mg  hs.

## 2021-04-06 NOTE — Assessment & Plan Note (Signed)
stable, on Omeprazole 20mg  qod.

## 2021-04-07 NOTE — Telephone Encounter (Signed)
Below is Manxie's response that was sent via a routing message,RX is pending for Manxie to review and sign:  Mast, Man X, NP to Me      10:26 AM  Lets just do 2.5% Hydrocortisone cream. Thanks.

## 2021-04-25 ENCOUNTER — Encounter (INDEPENDENT_AMBULATORY_CARE_PROVIDER_SITE_OTHER): Payer: Medicare Other | Admitting: Ophthalmology

## 2021-05-02 ENCOUNTER — Other Ambulatory Visit: Payer: Self-pay | Admitting: Adult Health

## 2021-05-10 DIAGNOSIS — L814 Other melanin hyperpigmentation: Secondary | ICD-10-CM | POA: Diagnosis not present

## 2021-05-10 DIAGNOSIS — L570 Actinic keratosis: Secondary | ICD-10-CM | POA: Diagnosis not present

## 2021-05-10 DIAGNOSIS — Z85828 Personal history of other malignant neoplasm of skin: Secondary | ICD-10-CM | POA: Diagnosis not present

## 2021-05-10 DIAGNOSIS — D229 Melanocytic nevi, unspecified: Secondary | ICD-10-CM | POA: Diagnosis not present

## 2021-05-10 DIAGNOSIS — L821 Other seborrheic keratosis: Secondary | ICD-10-CM | POA: Diagnosis not present

## 2021-05-30 ENCOUNTER — Encounter (INDEPENDENT_AMBULATORY_CARE_PROVIDER_SITE_OTHER): Payer: Self-pay | Admitting: Ophthalmology

## 2021-05-30 ENCOUNTER — Ambulatory Visit (INDEPENDENT_AMBULATORY_CARE_PROVIDER_SITE_OTHER): Payer: Medicare Other | Admitting: Ophthalmology

## 2021-05-30 DIAGNOSIS — H353113 Nonexudative age-related macular degeneration, right eye, advanced atrophic without subfoveal involvement: Secondary | ICD-10-CM

## 2021-05-30 DIAGNOSIS — H353212 Exudative age-related macular degeneration, right eye, with inactive choroidal neovascularization: Secondary | ICD-10-CM

## 2021-05-30 DIAGNOSIS — H353222 Exudative age-related macular degeneration, left eye, with inactive choroidal neovascularization: Secondary | ICD-10-CM | POA: Diagnosis not present

## 2021-05-30 DIAGNOSIS — H353124 Nonexudative age-related macular degeneration, left eye, advanced atrophic with subfoveal involvement: Secondary | ICD-10-CM

## 2021-05-30 NOTE — Assessment & Plan Note (Signed)
Accounts for acuity OS 

## 2021-05-30 NOTE — Assessment & Plan Note (Signed)
No sign of recurrence 

## 2021-05-30 NOTE — Progress Notes (Signed)
05/30/2021     CHIEF COMPLAINT Patient presents for  Chief Complaint  Patient presents with   Retina Follow Up      HISTORY OF PRESENT ILLNESS: Jacqueline Dodson is a 87 y.o. female who presents to the clinic today for:   HPI     Retina Follow Up           Diagnosis: Dry AMD   Laterality: both eyes   Onset: 10   Severity: moderate   Duration: 10 months   Course: stable   MD Performed: performed the HPI with the patient and updated documentation appropriately         Comments   10 mos fu ou oct fp Patient states "vision has had maybe a little bit of a decline but I have not noticed any distortion. I can still read regular print, newspaper is harder to read." Denies any new floaters or FOL.       Last edited by Laurin Coder, COA on 05/30/2021 10:36 AM.      Referring physician: Mast, Man X, NP [address],  [place] [id number]  HISTORICAL INFORMATION:   Selected notes from the MEDICAL RECORD NUMBER    Lab Results  Component Value Date   HGBA1C 5.5 08/18/2019     CURRENT MEDICATIONS: No current outpatient medications on file. (Ophthalmic Drugs)   No current facility-administered medications for this visit. (Ophthalmic Drugs)   Current Outpatient Medications (Other)  Medication Sig   acetaminophen (TYLENOL) 500 MG tablet Take 500 mg by mouth as needed.   calcium carbonate (TUMS - DOSED IN MG ELEMENTAL CALCIUM) 500 MG chewable tablet Chew 1 tablet by mouth as needed for indigestion or heartburn.    cholecalciferol (VITAMIN D) 25 MCG (1000 UT) tablet Take 1,000 Units by mouth daily.   gabapentin (NEURONTIN) 100 MG capsule TAKE 1 CAPSULE TWICE DAILY   hydrocortisone 2.5 % cream Apply topically 2 (two) times daily as needed.   LORazepam (ATIVAN) 0.5 MG tablet Take 0.5 tablets (0.25 mg total) by mouth at bedtime.   Multiple Vitamins-Minerals (PRESERVISION AREDS 2 PO) Take 1 tablet by mouth daily.   NIFEdipine (PROCARDIA XL/NIFEDICAL XL) 60 MG 24 hr  tablet TAKE 1 TABLET DAILY FOR    BLOOD PRESSURE   omeprazole (PRILOSEC OTC) 20 MG tablet Take 20 mg by mouth every other day.    Probiotic Product (PROBIOTIC ADVANCED PO) Take by mouth.   No current facility-administered medications for this visit. (Other)      REVIEW OF SYSTEMS:    ALLERGIES Allergies  Allergen Reactions   Amlodipine Other (See Comments)    Reaction:  Critically decreased sodium and potassium levels.     Enalapril Other (See Comments)    Reaction:  Unknown    Hctz [Hydrochlorothiazide] Other (See Comments)    Reaction:  Unknown    Penicillins Other (See Comments)    Reaction:  Unknown    Amoxicillin Swelling and Rash    PAST MEDICAL HISTORY Past Medical History:  Diagnosis Date   Abnormality of gait    Acute, but ill-defined, cerebrovascular disease    Anxiety    Arthritis    Chronic kidney disease, stage II (mild)    Cystocele, midline    Dermatophytosis of the body    Disturbance of skin sensation    Dizziness and giddiness    Duodenal ulcer, unspecified as acute or chronic, without hemorrhage, perforation, or obstruction    Edema    Elevated blood  pressure reading without diagnosis of hypertension    GERD (gastroesophageal reflux disease)    Hemorrhoids    Hypertension    Left bundle branch hemiblock    Lumbago    Macular degeneration (senile) of retina, unspecified    Myalgia and myositis, unspecified    Orthostatic hypotension    Other abnormal blood chemistry    Other malaise and fatigue    Personal history of fall    Polyneuropathy in other diseases classified elsewhere ([place]) 02/16/2014   Postmenopausal atrophic vaginitis    Primary cerebellar degeneration (HCC)    Rectocele    Restless legs syndrome (RLS)    Unspecified constipation    Unspecified hereditary and idiopathic peripheral neuropathy    Urgency of urination    Past Surgical History:  Procedure Laterality Date   ABDOMINAL HYSTERECTOMY  1972   APPENDECTOMY  1945    CATARACT EXTRACTION Bilateral    EXTERNAL EAR SURGERY  2010   left outer ear basel cell removed   TONSILLECTOMY  1940    FAMILY HISTORY Family History  Problem Relation Age of Onset   Aortic stenosis Mother    Heart failure Mother    Thrombosis Father    Coronary artery disease Father    Ataxia Sister    Ataxia Brother    Heart disease Brother    Cancer Brother        Prostate   Stroke Brother     SOCIAL HISTORY Social History   Tobacco Use   Smoking status: Never   Smokeless tobacco: Never  Vaping Use   Vaping Use: Never used  Substance Use Topics   Alcohol use: No    Alcohol/week: 0.0 standard drinks   Drug use: No         OPHTHALMIC EXAM:  Base Eye Exam     Visual Acuity (ETDRS)       Right Left   Dist cc 20/50 -2 CF at 3'   Dist ph cc NI NI    Correction: Glasses         Tonometry (Tonopen, 10:34 AM)       Right Left   Pressure 12 14         Pupils       Pupils Dark Light Shape React APD   Right PERRL 4 3 Round Brisk None   Left PERRL 4 3 Round Brisk None         Visual Fields (Counting fingers)       Left Right    Full Full         Extraocular Movement       Right Left    Full Full         Neuro/Psych     Oriented x3: Yes   Mood/Affect: Normal         Dilation     Both eyes: 1.0% Mydriacyl, 2.5% Phenylephrine @ 10:34 AM           Slit Lamp and Fundus Exam     External Exam       Right Left   External Normal Normal         Slit Lamp Exam       Right Left   Lids/Lashes Normal Normal   Conjunctiva/Sclera White and quiet White and quiet   Cornea Clear Clear   Anterior Chamber Deep and quiet Deep and quiet   Iris Round and reactive Round and reactive   Lens Posterior chamber intraocular lens Posterior  chamber intraocular lens   Anterior Vitreous Normal Normal         Fundus Exam       Right Left   Posterior Vitreous Posterior vitreous detachment Posterior vitreous detachment   Disc  Normal Normal   C/D Ratio 0.4 0.4   Macula Retinal pigment epithelial mottling, Atrophy with small portion of the temporal aspect of the fovea spared,, Advanced age related macular degeneration Retinal pigment epithelial mottling, Atrophy subfoveal, Advanced age related macular degeneration   Vessels Normal Normal   Periphery Normal Normal            IMAGING AND PROCEDURES  Imaging and Procedures for 05/30/21  OCT, Retina - OU - Both Eyes       Right Eye Quality was good. Scan locations included subfoveal. Central Foveal Thickness: 277. Findings include outer retinal atrophy, abnormal foveal contour, no SRF, no IRF.   Left Eye Quality was good. Scan locations included subfoveal. Central Foveal Thickness: 238. Findings include no IRF, no SRF, abnormal foveal contour, inner retinal atrophy, central retinal atrophy, outer retinal atrophy.   Notes No signs of active CNVM OU extensive dry atrophic areas but no sign of CNVM OD     Color Fundus Photography Optos - OU - Both Eyes       Right Eye Progression has been stable. Disc findings include normal observations. Macula : geographic atrophy, retinal pigment epithelium abnormalities. Vessels : normal observations. Periphery : normal observations.   Left Eye Progression has been stable. Disc findings include normal observations. Macula : retinal pigment epithelium abnormalities, geographic atrophy. Vessels : normal observations. Periphery : normal observations.   Notes OD with pigmented atrophy para macular diffuse  with geographic RPE atrophy.  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 Exudative age-related macular degeneration of right eye with inactive choroidal neovascularization (HCC)  H35.3212 OCT, Retina - OU - Both Eyes    Color Fundus Photography Optos - OU - Both Eyes    2. 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 Advanced nonexudative age-related macular degeneration of right eye without subfoveal involvement  H35.3113     4. Exudative age-related macular degeneration of left eye with inactive choroidal neovascularization ([place])  H35.3222       1.  Patient instructed to notify the office promptly new onset visual acuity declines or distortions in either eye but particularly the right eye  2.  3.  Ophthalmic Meds Ordered this visit:  No orders of the defined types were placed in this encounter.      Return in about 10 months (around 03/30/2022) for DILATE OU, COLOR FP, OCT.  There are no Patient Instructions on file for this visit.   Explained the diagnoses, plan, and follow up with the patient and they  expressed understanding.  Patient expressed understanding of the importance of proper follow up care.   Clent Demark Lindley Hiney M.D. Diseases & Surgery of the Retina and Vitreous [facility] [place] 05/30/21     Abbreviations: M myopia (nearsighted); A astigmatism; H hyperopia (farsighted); P presbyopia; Mrx spectacle prescription;  CTL contact lenses; OD right eye; OS left eye; OU both eyes  XT exotropia; ET esotropia; PEK punctate epithelial keratitis; PEE punctate epithelial erosions; DES dry eye syndrome;  MGD meibomian gland dysfunction; ATs artificial tears; PFAT's preservative free artificial tears; [place] nuclear sclerotic cataract; PSC posterior subcapsular cataract; ERM epi-retinal membrane; PVD posterior vitreous detachment; RD retinal detachment; DM diabetes mellitus; DR diabetic retinopathy; NPDR non-proliferative diabetic retinopathy; PDR proliferative diabetic retinopathy; CSME clinically significant macular edema; DME diabetic macular edema; dbh dot blot hemorrhages; CWS cotton wool spot; POAG primary open angle glaucoma; C/D cup-to-disc ratio; HVF humphrey visual field; GVF goldmann visual field; OCT optical coherence tomography; IOP intraocular pressure; BRVO Branch retinal vein occlusion; CRVO central retinal vein occlusion; CRAO central retinal artery occlusion; BRAO branch retinal artery occlusion; RT retinal tear; SB scleral buckle; PPV pars plana vitrectomy; VH Vitreous hemorrhage; PRP panretinal laser photocoagulation; IVK intravitreal kenalog; VMT vitreomacular traction; MH Macular hole;  NVD neovascularization of the disc; NVE neovascularization elsewhere; AREDS age related eye disease study; ARMD age related macular degeneration; POAG primary open angle glaucoma; EBMD epithelial/anterior basement membrane dystrophy; ACIOL anterior chamber intraocular lens; IOL intraocular lens; PCIOL posterior chamber intraocular lens; Phaco/IOL phacoemulsification with intraocular lens placement; [place] photorefractive keratectomy; LASIK laser assisted in situ keratomileusis; HTN hypertension; DM diabetes mellitus; COPD chronic obstructive pulmonary disease

## 2021-05-30 NOTE — Assessment & Plan Note (Signed)
Accounts for acuity fact patient has surprisingly good acuity with significant pigmentary atrophy

## 2021-06-18 ENCOUNTER — Other Ambulatory Visit: Payer: Self-pay | Admitting: Nurse Practitioner

## 2021-07-05 ENCOUNTER — Other Ambulatory Visit: Payer: Self-pay | Admitting: Nurse Practitioner

## 2021-07-05 DIAGNOSIS — F411 Generalized anxiety disorder: Secondary | ICD-10-CM

## 2021-07-18 ENCOUNTER — Other Ambulatory Visit: Payer: Self-pay | Admitting: Nurse Practitioner

## 2021-07-24 DIAGNOSIS — Z23 Encounter for immunization: Secondary | ICD-10-CM | POA: Diagnosis not present

## 2021-07-28 ENCOUNTER — Other Ambulatory Visit: Payer: Self-pay | Admitting: *Deleted

## 2021-07-28 MED ORDER — NIFEDIPINE ER OSMOTIC RELEASE 60 MG PO TB24: 60.0000 mg | ORAL_TABLET | Freq: Every day | ORAL | 0 refills | Status: DC

## 2021-07-28 NOTE — Telephone Encounter (Signed)
Patient Nifedipine Rx was denied by someone due to patient not having an upcoming appointment.  Patient scheduled an appointment for December, per [facility] last OV note she wanted to see her back in 6 months.   Rx Pended and sent to [facility] for approval due to [place].

## 2021-09-04 ENCOUNTER — Other Ambulatory Visit: Payer: Self-pay | Admitting: Orthopedic Surgery

## 2021-09-04 DIAGNOSIS — F411 Generalized anxiety disorder: Secondary | ICD-10-CM

## 2021-09-04 NOTE — Telephone Encounter (Signed)
Patient has request refill on medication "Lorazepam". Patient last refill was 07/05/2021. Patient has Non Opioid Contract on file dated 11/12/2019. Patient has upcoming appointment on 09/14/2021. Update Contract added to patient appointment note. Medication pend and sent to PCP Mast, Man X, NP for approval. Please Advise.

## 2021-09-14 ENCOUNTER — Other Ambulatory Visit: Payer: Self-pay

## 2021-09-14 ENCOUNTER — Non-Acute Institutional Stay: Payer: Medicare Other | Admitting: Nurse Practitioner

## 2021-09-14 ENCOUNTER — Encounter: Payer: Self-pay | Admitting: Nurse Practitioner

## 2021-09-14 DIAGNOSIS — N184 Chronic kidney disease, stage 4 (severe): Secondary | ICD-10-CM | POA: Diagnosis not present

## 2021-09-14 DIAGNOSIS — G4700 Insomnia, unspecified: Secondary | ICD-10-CM | POA: Diagnosis not present

## 2021-09-14 DIAGNOSIS — I10 Essential (primary) hypertension: Secondary | ICD-10-CM | POA: Diagnosis not present

## 2021-09-14 DIAGNOSIS — K623 Rectal prolapse: Secondary | ICD-10-CM | POA: Diagnosis not present

## 2021-09-14 DIAGNOSIS — G63 Polyneuropathy in diseases classified elsewhere: Secondary | ICD-10-CM | POA: Diagnosis not present

## 2021-09-14 DIAGNOSIS — N3942 Incontinence without sensory awareness: Secondary | ICD-10-CM | POA: Diagnosis not present

## 2021-09-14 DIAGNOSIS — K219 Gastro-esophageal reflux disease without esophagitis: Secondary | ICD-10-CM

## 2021-09-14 NOTE — Assessment & Plan Note (Signed)
blood pressure is controlled, on Nifedipine 60mg qd.  

## 2021-09-14 NOTE — Assessment & Plan Note (Signed)
Hemorrhoids/rectal mucosa prolapse, bleeding on and off 

## 2021-09-14 NOTE — Progress Notes (Signed)
Location:   clinic [place]   Place of Service:  Clinic (12) Provider: Marlana Latus NP  Code Status: DNR Goals of Care: [place] 09/14/2021  Does Patient Have a Medical Advance Directive? Yes  Type of Paramedic of [place];Living will  Does patient want to make changes to medical advance directive? No - Patient declined  Copy of [place] in Chart? Yes - validated most recent copy scanned in chart (See row information)  Would patient like information on creating a medical advance directive? -     Chief Complaint  Patient presents with   Medical Management of Chronic Issues    6 month follow-up and sign treatment agreement for lorazepam. Discuss need for shingrix, and covid booster or postpone/exclude if patient refuses     HPI: Patient is a 87 y.o. female seen today for medical management of chronic diseases.    HTN, blood pressure is controlled, on Nifedipine 60mg  qd.  GERD, stable, on Omeprazole  Her mood is stable, on Lorazepam 0.25mg  qhs. Peripheral neuropathy in BLE, stable, on Gabapentin  Cystocele, incontinent, grade 3, bladder bulges out the opening the vagina.  Hemorrhoids/rectal mucosa prolapse, bleeding on and off.  CKD stage 4, f/u Nephrology.    Past Medical History:  Diagnosis Date   Abnormality of gait    Acute, but ill-defined, cerebrovascular disease    Anxiety    Arthritis    Chronic kidney disease, stage II (mild)    Cystocele, midline    Dermatophytosis of the body    Disturbance of skin sensation    Dizziness and giddiness    Duodenal ulcer, unspecified as acute or chronic, without hemorrhage, perforation, or obstruction    Edema    Elevated blood pressure reading without diagnosis of hypertension    GERD (gastroesophageal reflux disease)    Hemorrhoids    Hypertension    Left bundle branch hemiblock    Lumbago    Macular degeneration (senile) of retina, unspecified    Myalgia and myositis,  unspecified    Orthostatic hypotension    Other abnormal blood chemistry    Other malaise and fatigue    Personal history of fall    Polyneuropathy in other diseases classified elsewhere ([place]) 02/16/2014   Postmenopausal atrophic vaginitis    Primary cerebellar degeneration (HCC)    Rectocele    Restless legs syndrome (RLS)    Unspecified constipation    Unspecified hereditary and idiopathic peripheral neuropathy    Urgency of urination     Past Surgical History:  Procedure Laterality Date   ABDOMINAL HYSTERECTOMY  1972   APPENDECTOMY  1945   CATARACT EXTRACTION Bilateral    EXTERNAL EAR SURGERY  2010   left outer ear basel cell removed   TONSILLECTOMY  1940    Allergies  Allergen Reactions   Amlodipine Other (See Comments)    Reaction:  Critically decreased sodium and potassium levels.     Enalapril Other (See Comments)    Reaction:  Unknown    Hctz [Hydrochlorothiazide] Other (See Comments)    Reaction:  Unknown    Penicillins Other (See Comments)    Reaction:  Unknown    Amoxicillin Swelling and Rash    Allergies as of 09/14/2021       Reactions   Amlodipine Other (See Comments)   Reaction:  Critically decreased sodium and potassium levels.     Enalapril Other (See Comments)   Reaction:  Unknown    Hctz [hydrochlorothiazide]  Other (See Comments)   Reaction:  Unknown    Penicillins Other (See Comments)   Reaction:  Unknown    Amoxicillin Swelling, Rash        Medication List        Accurate as of September 14, 2021 11:59 PM. If you have any questions, ask your nurse or doctor.          acetaminophen 500 MG tablet Commonly known as: TYLENOL Take 500 mg by mouth as needed.   calcium carbonate 500 MG chewable tablet Commonly known as: TUMS - dosed in mg elemental calcium Chew 1 tablet by mouth as needed for indigestion or heartburn.   cholecalciferol 25 MCG (1000 UNIT) tablet Commonly known as: VITAMIN D Take 1,000 Units by mouth daily.    gabapentin 100 MG capsule Commonly known as: NEURONTIN TAKE 1 CAPSULE TWICE DAILY   hydrocortisone 2.5 % cream Apply topically 2 (two) times daily as needed.   LORazepam 0.5 MG tablet Commonly known as: ATIVAN TAKE 1/2 TABLET BY MOUTH AT BEDTIME   multivitamin tablet Take 1 tablet by mouth daily.   NIFEdipine 60 MG 24 hr tablet Commonly known as: PROCARDIA XL/NIFEDICAL XL Take 1 tablet (60 mg total) by mouth daily. for blood pressure   omeprazole 20 MG tablet Commonly known as: PRILOSEC OTC Take 20 mg by mouth every other day.   PRESERVISION AREDS 2 PO Take 1 tablet by mouth daily.   PROBIOTIC ADVANCED PO Take by mouth.        Review of Systems:  Review of Systems  Constitutional:  Negative for fatigue, fever and unexpected weight change.  HENT:  Positive for hearing loss. Negative for congestion and voice change.   Eyes:  Negative for visual disturbance.  Respiratory:  Negative for cough and shortness of breath.   Cardiovascular:  Negative for leg swelling.  Gastrointestinal:  Negative for abdominal pain and constipation.       Alternating constipation, diarrhea if taking Laxative.   Genitourinary:  Positive for frequency. Negative for dysuria and urgency.       2-3x/night bathroom trips.   Musculoskeletal:  Positive for arthralgias and gait problem.  Skin:  Negative for color change.  Neurological:  Negative for speech difficulty, weakness and headaches.  Psychiatric/Behavioral:  Positive for sleep disturbance. Negative for behavioral problems. The patient is not nervous/anxious.    Health Maintenance  Topic Date Due   Zoster Vaccines- Shingrix (1 of 2) Never done   COVID-19 Vaccine (3 - Booster for Moderna series) 10/11/2020   TETANUS/TDAP  09/04/2027   Pneumonia Vaccine 18+ Years old  Completed   INFLUENZA VACCINE  Completed   HPV VACCINES  Aged Out   DEXA SCAN  Discontinued    Physical Exam: Vitals:   09/14/21 0940  BP: 122/66  Pulse: 78  Temp:  (!) 97.5 F (36.4 C)  SpO2: 97%  Weight: 146 lb (66.2 kg)  Height: 5\' 5"  (1.651 m)   Body mass index is 24.3 kg/m. Physical Exam Vitals and nursing note reviewed.  Constitutional:      Appearance: Normal appearance. She is normal weight.  HENT:     Head: Normocephalic and atraumatic.     Mouth/Throat:     Mouth: Mucous membranes are moist.  Eyes:     Extraocular Movements: Extraocular movements intact.     Conjunctiva/sclera: Conjunctivae normal.     Pupils: Pupils are equal, round, and reactive to light.  Cardiovascular:     Rate and Rhythm: Normal rate  and regular rhythm.     Heart sounds: Murmur heard.  Pulmonary:     Breath sounds: No rales.  Abdominal:     General: Bowel sounds are normal.     Palpations: Abdomen is soft.     Tenderness: There is no abdominal tenderness.  Genitourinary:    Comments: Complete cystocele, rectal mucosa prolapse from previous examination.  Musculoskeletal:     Cervical back: Normal range of motion and neck supple.     Right lower leg: No edema.     Left lower leg: No edema.  Skin:    General: Skin is warm and dry.  Neurological:     General: No focal deficit present.     Mental Status: She is alert and oriented to person, place, and time. Mental status is at baseline.     Motor: No weakness.     Coordination: Coordination normal.     Gait: Gait abnormal.  Psychiatric:        Mood and Affect: Mood normal.        Behavior: Behavior normal.        Thought Content: Thought content normal.        Judgment: Judgment normal.    Labs reviewed: Basic Metabolic Panel: No results for input(s): NA, K, CL, CO2, GLUCOSE, BUN, CREATININE, CALCIUM, MG, PHOS, TSH in the last 8760 hours. Liver Function Tests: No results for input(s): AST, ALT, ALKPHOS, BILITOT, PROT, ALBUMIN in the last 8760 hours. No results for input(s): LIPASE, AMYLASE in the last 8760 hours. No results for input(s): AMMONIA in the last 8760 hours. CBC: No results for  input(s): WBC, NEUTROABS, HGB, HCT, MCV, PLT in the last 8760 hours. Lipid Panel: No results for input(s): CHOL, HDL, LDLCALC, TRIG, CHOLHDL, LDLDIRECT in the last 8760 hours. Lab Results  Component Value Date   HGBA1C 5.5 08/18/2019    Procedures since last visit: No results found.  Assessment/Plan  HTN (hypertension) blood pressure is controlled, on Nifedipine 60mg  qd.   GERD (gastroesophageal reflux disease) stable, on Omeprazole   Insomnia Stable, on Lorazepam 0.25mg  qhs.   Peripheral neuropathy Ambulates with walker, stable, on Gabapentin   Incontinent of urine Cystocele, incontinent, grade 3, bladder bulges out the opening the vagina.   Rectal mucosa prolapse Hemorrhoids/rectal mucosa prolapse, bleeding on and off.   CKD (chronic kidney disease) stage 4, GFR 15-29 ml/min (HCC) f/u Nephrology.   The patient is going to get Shingrix after Christmas.  Labs/tests ordered:  none  Next appt:  6 months

## 2021-09-14 NOTE — Assessment & Plan Note (Signed)
Ambulates with walker, stable, on Gabapentin

## 2021-09-14 NOTE — Assessment & Plan Note (Signed)
Cystocele, incontinent, grade 3, bladder bulges out the opening the vagina.  

## 2021-09-14 NOTE — Assessment & Plan Note (Signed)
Stable, on Lorazepam 0.25mg  qhs.

## 2021-09-14 NOTE — Assessment & Plan Note (Signed)
stable, on Omeprazole.  

## 2021-09-14 NOTE — Assessment & Plan Note (Signed)
f/u Nephrology.

## 2021-09-15 ENCOUNTER — Encounter: Payer: Self-pay | Admitting: Nurse Practitioner

## 2021-09-19 DIAGNOSIS — L814 Other melanin hyperpigmentation: Secondary | ICD-10-CM | POA: Diagnosis not present

## 2021-09-19 DIAGNOSIS — L570 Actinic keratosis: Secondary | ICD-10-CM | POA: Diagnosis not present

## 2021-09-19 DIAGNOSIS — L821 Other seborrheic keratosis: Secondary | ICD-10-CM | POA: Diagnosis not present

## 2021-09-19 DIAGNOSIS — Z85828 Personal history of other malignant neoplasm of skin: Secondary | ICD-10-CM | POA: Diagnosis not present

## 2021-09-19 DIAGNOSIS — L820 Inflamed seborrheic keratosis: Secondary | ICD-10-CM | POA: Diagnosis not present

## 2021-10-17 ENCOUNTER — Other Ambulatory Visit: Payer: Self-pay | Admitting: Adult Health

## 2021-10-17 ENCOUNTER — Other Ambulatory Visit: Payer: Self-pay | Admitting: Nurse Practitioner

## 2021-11-01 ENCOUNTER — Other Ambulatory Visit: Payer: Self-pay | Admitting: Nurse Practitioner

## 2021-11-01 DIAGNOSIS — F411 Generalized anxiety disorder: Secondary | ICD-10-CM

## 2021-11-02 NOTE — Telephone Encounter (Signed)
RX last refilled on 09/04/2021 #15 with 1 refill   Treatment agreement on file from 09/2021

## 2021-11-16 ENCOUNTER — Encounter: Payer: Medicare Other | Admitting: Nurse Practitioner

## 2021-11-21 DIAGNOSIS — L821 Other seborrheic keratosis: Secondary | ICD-10-CM | POA: Diagnosis not present

## 2021-11-21 DIAGNOSIS — L570 Actinic keratosis: Secondary | ICD-10-CM | POA: Diagnosis not present

## 2021-11-21 DIAGNOSIS — Z85828 Personal history of other malignant neoplasm of skin: Secondary | ICD-10-CM | POA: Diagnosis not present

## 2021-11-21 DIAGNOSIS — L814 Other melanin hyperpigmentation: Secondary | ICD-10-CM | POA: Diagnosis not present

## 2021-11-23 ENCOUNTER — Telehealth: Payer: Self-pay

## 2021-11-23 ENCOUNTER — Encounter: Payer: Self-pay | Admitting: Nurse Practitioner

## 2021-11-23 ENCOUNTER — Ambulatory Visit (INDEPENDENT_AMBULATORY_CARE_PROVIDER_SITE_OTHER): Payer: Medicare Other | Admitting: Nurse Practitioner

## 2021-11-23 ENCOUNTER — Other Ambulatory Visit: Payer: Self-pay

## 2021-11-23 DIAGNOSIS — Z0000 Encounter for general adult medical examination without abnormal findings: Secondary | ICD-10-CM

## 2021-11-23 NOTE — Progress Notes (Signed)
This service is provided via telemedicine  No vital signs collected/recorded due to the encounter was a telemedicine visit.   Location of patient (ex: home, work):  Home  Patient consents to a telephone visit:  Yes, see encounter dated 11/23/2021  Location of the provider (ex: office, home):  [place]  Name of any referring provider:  Mast, Man X, NP  Names of all persons participating in the telemedicine service and their role in the encounter:  Sherrie Mustache, Nurse Practitioner, Carroll Kinds, CMA, and patient.   Time spent on call:  9 minutes with medical assistant

## 2021-11-23 NOTE — Telephone Encounter (Signed)
[name], Jacqueline Dodson are scheduled for a virtual visit with your provider today.    Just as we do with appointments in the office, we must obtain your consent to participate.  Your consent will be active for this visit and any virtual visit you may have with one of our providers in the next 365 days.    If you have a MyChart account, I can also send a copy of this consent to you electronically.  All virtual visits are billed to your insurance company just like a traditional visit in the office.  As this is a virtual visit, video technology does not allow for your provider to perform a traditional examination.  This may limit your provider's ability to fully assess your condition.  If your provider identifies any concerns that need to be evaluated in person or the need to arrange testing such as labs, EKG, etc, we will make arrangements to do so.    Although advances in technology are sophisticated, we cannot ensure that it will always work on either your end or our end.  If the connection with a video visit is poor, we may have to switch to a telephone visit.  With either a video or telephone visit, we are not always able to ensure that we have a secure connection.   I need to obtain your verbal consent now.   Are you willing to proceed with your visit today?   Jacqueline Dodson has provided verbal consent on 11/23/2021 for a virtual visit (video or telephone).   Jacqueline Dodson, [facility] 11/23/2021  2:46 PM

## 2021-11-23 NOTE — Progress Notes (Signed)
Subjective:   Jacqueline Dodson is a 88 y.o. female who presents for Medicare Annual (Subsequent) preventive examination.  Review of Systems           Objective:    There were no vitals filed for this visit. There is no height or weight on file to calculate BMI.  Advanced Directives 09/14/2021 04/06/2021 11/10/2020 02/11/2020 08/24/2019 09/26/2017 08/22/2017  Does Patient Have a Medical Advance Directive? Yes Yes Yes Yes Yes Yes Yes  Type of Paramedic of [place];Living will [place];Living will [place];Living will [place];Living will [place];Living will [place];Living will [place];Living will  Does patient want to make changes to medical advance directive? No - Patient declined No - Patient declined No - Patient declined No - Patient declined - - No - Patient declined  Copy of [place] in Chart? Yes - validated most recent copy scanned in chart (See row information) Yes - validated most recent copy scanned in chart (See row information) Yes - validated most recent copy scanned in chart (See row information) Yes - validated most recent copy scanned in chart (See row information) No - copy requested Yes Yes  Would patient like information on creating a medical advance directive? - - - - - - -    Current Medications (verified) Outpatient Encounter Medications as of 11/23/2021  Medication Sig   acetaminophen (TYLENOL) 500 MG tablet Take 500 mg by mouth as needed.   calcium carbonate (TUMS - DOSED IN MG ELEMENTAL CALCIUM) 500 MG chewable tablet Chew 1 tablet by mouth as needed for indigestion or heartburn.    cholecalciferol (VITAMIN D) 25 MCG (1000 UT) tablet Take 1,000 Units by mouth daily.   gabapentin (NEURONTIN) 100 MG capsule TAKE 1 CAPSULE TWICE DAILY   hydrocortisone 2.5 % cream Apply topically 2 (two) times daily as needed.    LORazepam (ATIVAN) 0.5 MG tablet TAKE 1/2 TABLET BY MOUTH AT BEDTIME   Multiple Vitamin (MULTIVITAMIN) tablet Take 1 tablet by mouth daily.   Multiple Vitamins-Minerals (PRESERVISION AREDS 2 PO) Take 1 tablet by mouth daily.   NIFEdipine (PROCARDIA XL/NIFEDICAL XL) 60 MG 24 hr tablet TAKE 1 TABLET DAILY FOR    BLOOD PRESSURE   omeprazole (PRILOSEC OTC) 20 MG tablet Take 20 mg by mouth every other day.    Probiotic Product (PROBIOTIC ADVANCED PO) Take by mouth.   No facility-administered encounter medications on file as of 11/23/2021.    Allergies (verified) Amlodipine, Enalapril, Hctz [hydrochlorothiazide], Penicillins, and Amoxicillin   History: Past Medical History:  Diagnosis Date   Abnormality of gait    Acute, but ill-defined, cerebrovascular disease    Anxiety    Arthritis    Chronic kidney disease, stage II (mild)    Cystocele, midline    Dermatophytosis of the body    Disturbance of skin sensation    Dizziness and giddiness    Duodenal ulcer, unspecified as acute or chronic, without hemorrhage, perforation, or obstruction    Edema    Elevated blood pressure reading without diagnosis of hypertension    GERD (gastroesophageal reflux disease)    Hemorrhoids    Hypertension    Left bundle branch hemiblock    Lumbago    Macular degeneration (senile) of retina, unspecified    Myalgia and myositis, unspecified    Orthostatic hypotension    Other abnormal blood chemistry    Other malaise and fatigue  Personal history of fall    Polyneuropathy in other diseases classified elsewhere ([place]) 02/16/2014   Postmenopausal atrophic vaginitis    Primary cerebellar degeneration (HCC)    Rectocele    Restless legs syndrome (RLS)    Unspecified constipation    Unspecified hereditary and idiopathic peripheral neuropathy    Urgency of urination    Past Surgical History:  Procedure Laterality Date   ABDOMINAL HYSTERECTOMY  1972   APPENDECTOMY  1945   CATARACT EXTRACTION Bilateral     EXTERNAL EAR SURGERY  2010   left outer ear basel cell removed   TONSILLECTOMY  1940   Family History  Problem Relation Age of Onset   Aortic stenosis Mother    Heart failure Mother    Thrombosis Father    Coronary artery disease Father    Ataxia Sister    Ataxia Brother    Heart disease Brother    Cancer Brother        Prostate   Stroke Brother    Social History   Socioeconomic History   Marital status: Widowed    Spouse name: Not on file   Number of children: 5   Years of education: college   Highest education level: Not on file  Occupational History   Occupation: Retired    Comment: Control and instrumentation engineer  Tobacco Use   Smoking status: Never   Smokeless tobacco: Never  Vaping Use   Vaping Use: Never used  Substance and Sexual Activity   Alcohol use: No    Alcohol/week: 0.0 standard drinks   Drug use: No   Sexual activity: Never  Other Topics Concern   Not on file  Social History Narrative   Patient is widowed, with 5 children.   Patient is right handed.   Patient has college education.   Patient does not drink caffeine.   Social Determinants of Health   Financial Resource Strain: Not on file  Food Insecurity: Not on file  Transportation Needs: Not on file  Physical Activity: Not on file  Stress: Not on file  Social Connections: Not on file    Tobacco Counseling Counseling given: Not Answered   Clinical Intake:                 Diabetic?no         Activities of Daily Living No flowsheet data found.  Patient Care Team: Mast, Man X, NP as PCP - General (Internal Medicine) Lavonna Monarch, MD as Consulting Physician (Dermatology) Clent Jacks, MD as Consulting Physician (Ophthalmology) Elmarie Shiley, MD as Consulting Physician (Nephrology) Zadie Rhine Clent Demark, MD as Consulting Physician (Ophthalmology) [place] Mast, Man X, NP as Nurse Practitioner (Internal Medicine)  Indicate any recent Medical Services you may have  received from other than [facility] providers in the past year (date may be approximate).     Assessment:   This is a routine wellness examination for [place].  Hearing/Vision screen No results found.  Dietary issues and exercise activities discussed:     Goals Addressed   None   Depression Screen PHQ 2/9 Scores 09/14/2021 11/10/2020 09/26/2017 08/22/2017 08/28/2016  PHQ - 2 Score 0 0 0 0 0    Fall Risk Fall Risk  09/14/2021 04/06/2021 11/10/2020 02/11/2020 11/05/2019  Falls in the past year? 0 0 0 0 0  Comment - - - - -  Number falls in past yr: 0 0 0 0 0  Injury with Fall? 0 0 0 - -  Risk for fall due to :  No Fall Risks No Fall Risks - - -  Follow up Falls evaluation completed Falls evaluation completed - - -    FALL RISK PREVENTION PERTAINING TO THE HOME:  Any stairs in or around the home? Yes  If so, are there any without handrails? No  Home free of loose throw rugs in walkways, pet beds, electrical cords, etc? Yes  Adequate lighting in your home to reduce risk of falls? Yes   ASSISTIVE DEVICES UTILIZED TO PREVENT FALLS:  Life alert? Yes  Use of a cane, walker or w/c? Yes  Grab bars in the bathroom? Yes  Shower chair or bench in shower? Yes  Elevated toilet seat or a handicapped toilet? Yes   TIMED UP AND GO:  Was the test performed? No .    Cognitive Function: MMSE - Mini Mental State Exam 05/21/2019 09/26/2017  Not completed: (No Data) (No Data)  Orientation to time 5 5  Orientation to Place 5 5  Registration 3 3  Attention/ Calculation 5 5  Recall 3 3  Language- name 2 objects 2 2  Language- repeat 1 1  Language- follow 3 step command 3 3  Language- read & follow direction 1 1  Write a sentence 1 1  Copy design 0 1  Total score 29 30     6CIT Screen 11/10/2020  What Year? 0 points  What month? 0 points  What time? 0 points  Count back from 20 0 points  Months in reverse 0 points  Repeat phrase 2 points  Total Score 2    Immunizations Immunization History   Administered Date(s) Administered   Fluad Quad(high Dose 65+) 07/24/2021   Influenza Whole 07/08/2012, 07/10/2018   Influenza, High Dose Seasonal PF 07/17/2017, 07/22/2019, 07/20/2020   Influenza-Unspecified 07/26/2014, 06/09/2015, 07/19/2016   Moderna SARS-COV2 Booster Vaccination 08/16/2020   Moderna Sars-Covid-2 Vaccination 10/12/2019, 11/09/2019   PPD Test 09/07/1982   Pneumococcal Conjugate-13 09/03/2017   Pneumococcal Polysaccharide-23 10/08/1994   Td 10/09/1995   Tdap 09/03/2017   Zoster, Live 10/08/2005    TDAP status: Up to date  Flu Vaccine status: Up to date  Pneumococcal vaccine status: Up to date  Covid-19 vaccine status: Information provided on how to obtain vaccines.   Qualifies for Shingles Vaccine? Yes   Zostavax completed No   Shingrix Completed?: No.    Education has been provided regarding the importance of this vaccine. Patient has been advised to call insurance company to determine out of pocket expense if they have not yet received this vaccine. Advised may also receive vaccine at local pharmacy or Health Dept. Verbalized acceptance and understanding.  Screening Tests Health Maintenance  Topic Date Due   Zoster Vaccines- Shingrix (1 of 2) Never done   COVID-19 Vaccine (3 - Booster for Moderna series) 10/11/2020   TETANUS/TDAP  09/04/2027   Pneumonia Vaccine 40+ Years old  Completed   INFLUENZA VACCINE  Completed   HPV VACCINES  Aged Out   DEXA SCAN  Discontinued    Health Maintenance  Health Maintenance Due  Topic Date Due   Zoster Vaccines- Shingrix (1 of 2) Never done   COVID-19 Vaccine (3 - Booster for Moderna series) 10/11/2020    Colorectal cancer screening: No longer required.   Mammogram status: No longer required due to aged out.   Lung Cancer Screening: (Low Dose CT Chest recommended if Age 37-80 years, 30 pack-year currently smoking OR have quit w/in 15years.) does not qualify.   Lung Cancer Screening Referral: na  Additional  Screening:  Hepatitis C Screening: does not qualify; Completed  Vision Screening: Recommended annual ophthalmology exams for early detection of glaucoma and other disorders of the eye. Is the patient up to date with their annual eye exam?  Yes  Who is the provider or what is the name of the office in which the patient attends annual eye exams? [facility] If pt is not established with a provider, would they like to be referred to a provider to establish care? No .   Dental Screening: Recommended annual dental exams for proper oral hygiene  Community Resource Referral / Chronic Care Management: CRR required this visit?  No   CCM required this visit?  No      Plan:     I have personally reviewed and noted the following in the patients chart:   Medical and social history Use of alcohol, tobacco or illicit drugs  Current medications and supplements including opioid prescriptions.  Functional ability and status Nutritional status Physical activity Advanced directives List of other physicians Hospitalizations, surgeries, and ER visits in previous 12 months Vitals Screenings to include cognitive, depression, and falls Referrals and appointments  In addition, I have reviewed and discussed with patient certain preventive protocols, quality metrics, and best practice recommendations. A written personalized care plan for preventive services as well as general preventive health recommendations were provided to patient.     Lauree Chandler, NP   11/23/2021    Virtual Visit via Telephone Note  I connected with patient 11/23/21 at  2:45 PM EST by telephone and verified that I am speaking with the correct person using two identifiers.  Location: Patient: home Provider: [facility]   I discussed the limitations, risks, security and privacy concerns of performing an evaluation and management service by telephone and the availability of in person appointments. I also discussed with the  patient that there may be a patient responsible charge related to this service. The patient expressed understanding and agreed to proceed.   I discussed the assessment and treatment plan with the patient. The patient was provided an opportunity to ask questions and all were answered. The patient agreed with the plan and demonstrated an understanding of the instructions.   The patient was advised to call back or seek an in-person evaluation if the symptoms worsen or if the condition fails to improve as anticipated.  I provided 16 minutes of non-face-to-face time during this encounter.  [employer]. Harle [employer] printed and mailed

## 2021-11-23 NOTE — Patient Instructions (Signed)
[name] , Thank you for taking time to come for your Medicare Wellness Visit. I appreciate your ongoing commitment to your health goals. Please review the following plan we discussed and let me know if I can assist you in the future.   Screening recommendations/referrals: Colonoscopy aged out Mammogram aged out Bone Density declined Recommended yearly ophthalmology/optometry visit for glaucoma screening and checkup Recommended yearly dental visit for hygiene and checkup  Vaccinations: Influenza vaccine up to date Pneumococcal vaccine up to date Tdap vaccine up to date Shingles vaccine-DUE- recommend to get at your local pharmacy      Advanced directives: on file.   Conditions/risks identified: advanced age.   Next appointment: yearly for awv   Preventive Care 80 Years and Older, Female Preventive care refers to lifestyle choices and visits with your health care provider that can promote health and wellness. What does preventive care include? A yearly physical exam. This is also called an annual well check. Dental exams once or twice a year. Routine eye exams. Ask your health care provider how often you should have your eyes checked. Personal lifestyle choices, including: Daily care of your teeth and gums. Regular physical activity. Eating a healthy diet. Avoiding tobacco and drug use. Limiting alcohol use. Practicing safe sex. Taking low-dose aspirin every day. Taking vitamin and mineral supplements as recommended by your health care provider. What happens during an annual well check? The services and screenings done by your health care provider during your annual well check will depend on your age, overall health, lifestyle risk factors, and family history of disease. Counseling  Your health care provider may ask you questions about your: Alcohol use. Tobacco use. Drug use. Emotional well-being. Home and relationship well-being. Sexual activity. Eating  habits. History of falls. Memory and ability to understand (cognition). Work and work Statistician. Reproductive health. Screening  You may have the following tests or measurements: Height, weight, and BMI. Blood pressure. Lipid and cholesterol levels. These may be checked every 5 years, or more frequently if you are over 79 years old. Skin check. Lung cancer screening. You may have this screening every year starting at age 40 if you have a 30-pack-year history of smoking and currently smoke or have quit within the past 15 years. Fecal occult blood test (FOBT) of the stool. You may have this test every year starting at age 7. Flexible sigmoidoscopy or colonoscopy. You may have a sigmoidoscopy every 5 years or a colonoscopy every 10 years starting at age 8. Hepatitis C blood test. Hepatitis B blood test. Sexually transmitted disease (STD) testing. Diabetes screening. This is done by checking your blood sugar (glucose) after you have not eaten for a while (fasting). You may have this done every 1-3 years. Bone density scan. This is done to screen for osteoporosis. You may have this done starting at age 35. Mammogram. This may be done every 1-2 years. Talk to your health care provider about how often you should have regular mammograms. Talk with your health care provider about your test results, treatment options, and if necessary, the need for more tests. Vaccines  Your health care provider may recommend certain vaccines, such as: Influenza vaccine. This is recommended every year. Tetanus, diphtheria, and acellular pertussis (Tdap, Td) vaccine. You may need a Td booster every 10 years. Zoster vaccine. You may need this after age 73. Pneumococcal 13-valent conjugate (PCV13) vaccine. One dose is recommended after age 1. Pneumococcal polysaccharide (PPSV23) vaccine. One dose is recommended after age 40. Talk  to your health care provider about which screenings and vaccines you need and how  often you need them. This information is not intended to replace advice given to you by your health care provider. Make sure you discuss any questions you have with your health care provider. Document Released: 10/21/2015 Document Revised: 06/13/2016 Document Reviewed: 07/26/2015 Elsevier Interactive Patient Education  2017 [employer] Prevention in the Home Falls can cause injuries. They can happen to people of all ages. There are many things you can do to make your home safe and to help prevent falls. What can I do on the outside of my home? Regularly fix the edges of walkways and driveways and fix any cracks. Remove anything that might make you trip as you walk through a door, such as a raised step or threshold. Trim any bushes or trees on the path to your home. Use bright outdoor lighting. Clear any walking paths of anything that might make someone trip, such as rocks or tools. Regularly check to see if handrails are loose or broken. Make sure that both sides of any steps have handrails. Any raised decks and porches should have guardrails on the edges. Have any leaves, snow, or ice cleared regularly. Use sand or salt on walking paths during winter. Clean up any spills in your garage right away. This includes oil or grease spills. What can I do in the bathroom? Use night lights. Install grab bars by the toilet and in the tub and shower. Do not use towel bars as grab bars. Use non-skid mats or decals in the tub or shower. If you need to sit down in the shower, use a plastic, non-slip stool. Keep the floor dry. Clean up any water that spills on the floor as soon as it happens. Remove soap buildup in the tub or shower regularly. Attach bath mats securely with double-sided non-slip rug tape. Do not have throw rugs and other things on the floor that can make you trip. What can I do in the bedroom? Use night lights. Make sure that you have a light by your bed that is easy to  reach. Do not use any sheets or blankets that are too big for your bed. They should not hang down onto the floor. Have a firm chair that has side arms. You can use this for support while you get dressed. Do not have throw rugs and other things on the floor that can make you trip. What can I do in the kitchen? Clean up any spills right away. Avoid walking on wet floors. Keep items that you use a lot in easy-to-reach places. If you need to reach something above you, use a strong step stool that has a grab bar. Keep electrical cords out of the way. Do not use floor polish or wax that makes floors slippery. If you must use wax, use non-skid floor wax. Do not have throw rugs and other things on the floor that can make you trip. What can I do with my stairs? Do not leave any items on the stairs. Make sure that there are handrails on both sides of the stairs and use them. Fix handrails that are broken or loose. Make sure that handrails are as long as the stairways. Check any carpeting to make sure that it is firmly attached to the stairs. Fix any carpet that is loose or worn. Avoid having throw rugs at the top or bottom of the stairs. If you do have throw rugs,  attach them to the floor with carpet tape. Make sure that you have a light switch at the top of the stairs and the bottom of the stairs. If you do not have them, ask someone to add them for you. What else can I do to help prevent falls? Wear shoes that: Do not have high heels. Have rubber bottoms. Are comfortable and fit you well. Are closed at the toe. Do not wear sandals. If you use a stepladder: Make sure that it is fully opened. Do not climb a closed stepladder. Make sure that both sides of the stepladder are locked into place. Ask someone to hold it for you, if possible. Clearly mark and make sure that you can see: Any grab bars or handrails. First and last steps. Where the edge of each step is. Use tools that help you move  around (mobility aids) if they are needed. These include: Canes. Walkers. Scooters. Crutches. Turn on the lights when you go into a dark area. Replace any light bulbs as soon as they burn out. Set up your furniture so you have a clear path. Avoid moving your furniture around. If any of your floors are uneven, fix them. If there are any pets around you, be aware of where they are. Review your medicines with your doctor. Some medicines can make you feel dizzy. This can increase your chance of falling. Ask your doctor what other things that you can do to help prevent falls. This information is not intended to replace advice given to you by your health care provider. Make sure you discuss any questions you have with your health care provider. Document Released: 07/21/2009 Document Revised: 03/01/2016 Document Reviewed: 10/29/2014 Elsevier Interactive Patient Education  2017 [employer].

## 2021-12-05 ENCOUNTER — Telehealth: Payer: Self-pay | Admitting: Adult Health

## 2021-12-05 ENCOUNTER — Ambulatory Visit (INDEPENDENT_AMBULATORY_CARE_PROVIDER_SITE_OTHER): Payer: Medicare Other | Admitting: Adult Health

## 2021-12-05 VITALS — BP 126/59 | HR 68 | Ht 65.0 in | Wt 144.6 lb

## 2021-12-05 DIAGNOSIS — R269 Unspecified abnormalities of gait and mobility: Secondary | ICD-10-CM | POA: Diagnosis not present

## 2021-12-05 DIAGNOSIS — G609 Hereditary and idiopathic neuropathy, unspecified: Secondary | ICD-10-CM | POA: Diagnosis not present

## 2021-12-05 NOTE — Progress Notes (Signed)
PATIENT: Jacqueline Dodson DOB: 1927/02/28  REASON FOR VISIT: follow up HISTORY FROM: patient  HISTORY OF PRESENT ILLNESS: Today 12/05/21:  Jacqueline Dodson is a 88 year old female with a history of peripheral neuropathy.  She returns today for follow-up.  She continues on gabapentin 100 mg twice a day.  She denies any significant discomfort reports that the gabapentin is helping.  She states that she feels as if when she is ambulating she is putting a lot of weight on her walker.  She does not have a lot of confidence with her walking.  Reports that she had a fall 2 weeks ago.  She that she slipped and fell fortunately no injuries.  She returns today for an evaluation.  12/05/20: Jacqueline Dodson is a 88 year old female with a history of peripheral neuropathy.  She returns today for for follow-up.  She states overall her symptoms have remained stable.  She denies any discomfort in the legs.  She feels that gabapentin controls this.  She does feel that the numbness in her feet has gotten worse.  She uses a Rollator when ambulating but feels that her legs  Weak-reports that she lacks confidence with ambulation.  She specifically has trouble with longer distances.  Denies any recent falls.  Returns today for an evaluation.  2/24/21Ms. Dodson is a 88 year old female with a history of neuropathy.  She returns today for follow-up.  She continues on gabapentin 100 mg at lunch and at bedtime.  Reports that this is managing her neuropathy.  She reports on occasion she will get burning and tingling in the thighs and then the arch of her feet.  She denies any changes with her gait or balance.  Continues to use a Rollator.  No falls.  Returns today for an evaluation.  HISTORY  06/01/19: Jacqueline Dodson is a 88 year old female with a history of neuropathy.  She returns today for follow-up.  She states her neuropathy is under relatively good control.  She states about a month ago she had sharp pain in the right heel.  She states that this  lasted 4 to 5 hours.  She is now taking gabapentin at noon and in the evening.  She reports that she is sleeping better.  She continues to use a Rollator.  She does feel weaker and more tired.  She did have a fall in June when she got up at night to go to the bathroom.  She states that the walker got ahead of her.  [facility].  She returns today for an evaluation.  REVIEW OF SYSTEMS: Out of a complete 14 system review of symptoms, the patient complains only of the following symptoms, and all other reviewed systems are negative.  ALLERGIES: Allergies  Allergen Reactions   Amlodipine Other (See Comments)    Reaction:  Critically decreased sodium and potassium levels.     Enalapril Other (See Comments)    Reaction:  Unknown    Hctz [Hydrochlorothiazide] Other (See Comments)    Reaction:  Unknown    Penicillins Other (See Comments)    Reaction:  Unknown    Amoxicillin Swelling and Rash    HOME MEDICATIONS: Outpatient Medications Prior to Visit  Medication Sig Dispense Refill   acetaminophen (TYLENOL) 500 MG tablet Take 500 mg by mouth as needed.     calcium carbonate (TUMS - DOSED IN MG ELEMENTAL CALCIUM) 500 MG chewable tablet Chew 1 tablet by mouth as needed for indigestion or  heartburn.      cholecalciferol (VITAMIN D) 25 MCG (1000 UT) tablet Take 1,000 Units by mouth daily.     gabapentin (NEURONTIN) 100 MG capsule TAKE 1 CAPSULE TWICE DAILY 180 capsule 1   hydrocortisone 2.5 % cream Apply topically 2 (two) times daily as needed. 30 g 0   LORazepam (ATIVAN) 0.5 MG tablet TAKE 1/2 TABLET BY MOUTH AT BEDTIME 15 tablet 5   Multiple Vitamin (MULTIVITAMIN) tablet Take 1 tablet by mouth daily.     Multiple Vitamins-Minerals (PRESERVISION AREDS 2 PO) Take 1 tablet by mouth daily.     NIFEdipine (PROCARDIA XL/NIFEDICAL XL) 60 MG 24 hr tablet TAKE 1 TABLET DAILY FOR    BLOOD PRESSURE 90 tablet 1   omeprazole (PRILOSEC OTC) 20 MG tablet Take 20 mg  by mouth every other day.      Probiotic Product (PROBIOTIC ADVANCED PO) Take by mouth.     No facility-administered medications prior to visit.    PAST MEDICAL HISTORY: Past Medical History:  Diagnosis Date   Abnormality of gait    Acute, but ill-defined, cerebrovascular disease    Anxiety    Arthritis    Chronic kidney disease, stage II (mild)    Cystocele, midline    Dermatophytosis of the body    Disturbance of skin sensation    Dizziness and giddiness    Duodenal ulcer, unspecified as acute or chronic, without hemorrhage, perforation, or obstruction    Edema    Elevated blood pressure reading without diagnosis of hypertension    GERD (gastroesophageal reflux disease)    Hemorrhoids    Hypertension    Left bundle branch hemiblock    Lumbago    Macular degeneration (senile) of retina, unspecified    Myalgia and myositis, unspecified    Orthostatic hypotension    Other abnormal blood chemistry    Other malaise and fatigue    Personal history of fall    Polyneuropathy in other diseases classified elsewhere ([place]) 02/16/2014   Postmenopausal atrophic vaginitis    Primary cerebellar degeneration (HCC)    Rectocele    Restless legs syndrome (RLS)    Unspecified constipation    Unspecified hereditary and idiopathic peripheral neuropathy    Urgency of urination     PAST SURGICAL HISTORY: Past Surgical History:  Procedure Laterality Date   ABDOMINAL HYSTERECTOMY  1972   APPENDECTOMY  1945   CATARACT EXTRACTION Bilateral    EXTERNAL EAR SURGERY  2010   left outer ear basel cell removed   TONSILLECTOMY  1940    FAMILY HISTORY: Family History  Problem Relation Age of Onset   Aortic stenosis Mother    Heart failure Mother    Thrombosis Father    Coronary artery disease Father    Ataxia Sister    Ataxia Brother    Heart disease Brother    Cancer Brother        Prostate   Stroke Brother     SOCIAL HISTORY: Social History   Socioeconomic History   Marital  status: Widowed    Spouse name: Not on file   Number of children: 5   Years of education: college   Highest education level: Not on file  Occupational History   Occupation: Retired    Comment: Control and instrumentation engineer  Tobacco Use   Smoking status: Never   Smokeless tobacco: Never  Vaping Use   Vaping Use: Never used  Substance and Sexual Activity   Alcohol use: No    Alcohol/week: 0.0  standard drinks   Drug use: No   Sexual activity: Never  Other Topics Concern   Not on file  Social History Narrative   Patient is widowed, with 5 children.   Patient is right handed.   Patient has college education.   Patient does not drink caffeine.   Social Determinants of Health   Financial Resource Strain: Not on file  Food Insecurity: Not on file  Transportation Needs: Not on file  Physical Activity: Not on file  Stress: Not on file  Social Connections: Not on file  Intimate Partner Violence: Not on file      PHYSICAL EXAM  Vitals:   12/05/21 1059  BP: (!) 126/59  Pulse: 68  Weight: 144 lb 9.6 oz (65.6 kg)  Height: 5\' 5"  (1.651 m)   Body mass index is 24.06 kg/m.  Generalized: Well developed, in no acute distress   Neurological examination  Mentation: Alert oriented to time, place, history taking. Follows all commands speech and language fluent Cranial nerve II-XII: Pupils were equal round reactive to light. Extraocular movements were full, visual field were full on confrontational test.. Head turning and shoulder shrug  were normal and symmetric. Motor: The motor testing reveals 5 over 5 strength of all 4 extremities. Good symmetric motor tone is noted throughout.  Sensory: Sensory testing is intact to soft touch on all 4 extremities. No evidence of extinction is noted.  Coordination: Cerebellar testing reveals good finger-nose-finger and heel-to-shin bilaterally.  Gait and station: Gait is normal.  Patient uses a Rollator when ambulating.  Tandem gait not  attempted.   DIAGNOSTIC DATA (LABS, IMAGING, TESTING) - I reviewed patient records, labs, notes, testing and imaging myself where available.  Lab Results  Component Value Date   WBC 4.3 09/21/2019   HGB 10.7 (L) 09/21/2019   HCT 32.3 (L) 09/21/2019   MCV 89.5 09/21/2019   PLT 147 09/21/2019      Component Value Date/Time   NA 138 08/24/2019 1425   NA 141 11/27/2018 0000   K 3.9 08/24/2019 1425   CL 109 08/24/2019 1425   CL 108 11/27/2018 0000   CO2 21 (L) 08/24/2019 1425   CO2 24 11/27/2018 0000   GLUCOSE 106 (H) 08/24/2019 1425   BUN 30 (H) 08/24/2019 1425   BUN 32 (A) 11/27/2018 0000   CREATININE 1.69 (H) 08/24/2019 1425   CREATININE 1.64 (H) 08/18/2019 0705   CALCIUM 9.2 08/24/2019 1425   CALCIUM 9.4 11/27/2018 0000   PROT 6.5 08/18/2019 0705   ALBUMIN 4.0 11/27/2018 0000   AST 16 08/18/2019 0705   ALT 12 08/18/2019 0705   ALKPHOS 55 02/11/2017 1030   BILITOT 0.4 08/18/2019 0705   GFRNONAA 26 (L) 08/24/2019 1425   GFRNONAA 27 (L) 08/18/2019 0705   GFRAA 30 (L) 08/24/2019 1425   GFRAA 32 (L) 08/18/2019 0705   Lab Results  Component Value Date   CHOL 213 (H) 08/18/2019   HDL 34 (L) 08/18/2019   LDLCALC 150 (H) 08/18/2019   TRIG 155 (H) 08/18/2019   CHOLHDL 6.3 (H) 08/18/2019   Lab Results  Component Value Date   HGBA1C 5.5 08/18/2019   Lab Results  Component Value Date   VITAMINB12 427 09/21/2019   Lab Results  Component Value Date   TSH 3.83 10/07/2018      ASSESSMENT AND PLAN 88 y.o. year old female  has a past medical history of Abnormality of gait, Acute, but ill-defined, cerebrovascular disease, Anxiety, Arthritis, Chronic kidney disease, stage II (  mild), Cystocele, midline, Dermatophytosis of the body, Disturbance of skin sensation, Dizziness and giddiness, Duodenal ulcer, unspecified as acute or chronic, without hemorrhage, perforation, or obstruction, Edema, Elevated blood pressure reading without diagnosis of hypertension, GERD  (gastroesophageal reflux disease), Hemorrhoids, Hypertension, Left bundle branch hemiblock, Lumbago, Macular degeneration (senile) of retina, unspecified, Myalgia and myositis, unspecified, Orthostatic hypotension, Other abnormal blood chemistry, Other malaise and fatigue, Personal history of fall, Polyneuropathy in other diseases classified elsewhere ([place]) (02/16/2014), Postmenopausal atrophic vaginitis, Primary cerebellar degeneration (HCC), Rectocele, Restless legs syndrome (RLS), Unspecified constipation, Unspecified hereditary and idiopathic peripheral neuropathy, and Urgency of urination. here with:  1.  Peripheral neuropathy   -Continue gabapentin 100 mg twice a day  2.  Abnormality of gait  -[facility]at [facility]   Overall the patient has remained relatively stable.  She has been on a stable dose of gabapentin for quite some time.  The patient will follow-up with our office on an as-needed basis.  Any future refills of gabapentin can come through her PCP      Ward Givens, MSN, NP-C 12/05/2021, 11:12 AM [facility] [address] [place], [place] [address] [phone]

## 2021-12-05 NOTE — Telephone Encounter (Signed)
Referral sent to [facility] [phone].

## 2022-01-22 DIAGNOSIS — L814 Other melanin hyperpigmentation: Secondary | ICD-10-CM | POA: Diagnosis not present

## 2022-01-22 DIAGNOSIS — L218 Other seborrheic dermatitis: Secondary | ICD-10-CM | POA: Diagnosis not present

## 2022-01-22 DIAGNOSIS — L821 Other seborrheic keratosis: Secondary | ICD-10-CM | POA: Diagnosis not present

## 2022-01-22 DIAGNOSIS — L820 Inflamed seborrheic keratosis: Secondary | ICD-10-CM | POA: Diagnosis not present

## 2022-01-22 DIAGNOSIS — Z85828 Personal history of other malignant neoplasm of skin: Secondary | ICD-10-CM | POA: Diagnosis not present

## 2022-01-22 DIAGNOSIS — L570 Actinic keratosis: Secondary | ICD-10-CM | POA: Diagnosis not present

## 2022-02-13 ENCOUNTER — Encounter: Payer: Self-pay | Admitting: Family Medicine

## 2022-02-14 ENCOUNTER — Non-Acute Institutional Stay: Payer: Medicare Other | Admitting: Family Medicine

## 2022-02-14 ENCOUNTER — Encounter: Payer: Self-pay | Admitting: Family Medicine

## 2022-02-14 DIAGNOSIS — R052 Subacute cough: Secondary | ICD-10-CM | POA: Diagnosis not present

## 2022-02-14 DIAGNOSIS — R059 Cough, unspecified: Secondary | ICD-10-CM | POA: Insufficient documentation

## 2022-02-14 MED ORDER — BENZONATATE 200 MG PO CAPS: 200.0000 mg | ORAL_CAPSULE | Freq: Two times a day (BID) | ORAL | 0 refills | Status: AC | PRN

## 2022-02-14 NOTE — Progress Notes (Signed)
? ? ?Provider:  ?Alain Honey, MD ? ?Careteam: ?Patient Care Team: ?Mast, Man X, NP as PCP - General (Internal Medicine) ?Lavonna Monarch, MD as Consulting Physician (Dermatology) ?Clent Jacks, MD as Consulting Physician (Ophthalmology) ?Elmarie Shiley, MD as Consulting Physician (Nephrology) ?Rankin, Clent Demark, MD as Consulting Physician (Ophthalmology) ?[place] ?Mast, Man X, NP as Nurse Practitioner (Internal Medicine) ? ?PLACE OF SERVICE:  ?[facility]  ?Advanced Directive information ?Does Patient Have a Medical Advance Directive?: Yes, Type of Advance Directive: [place];Out of facility DNR (pink MOST or yellow form), Does patient want to make changes to medical advance directive?: No - Patient declined ? ?Allergies  ?Allergen Reactions  ? Amlodipine Other (See Comments)  ?  Reaction:  Critically decreased sodium and potassium levels.    ? Enalapril Other (See Comments)  ?  Reaction:  Unknown   ? Hctz [Hydrochlorothiazide] Other (See Comments)  ?  Reaction:  Unknown   ? Penicillins Other (See Comments)  ?  Reaction:  Unknown   ? Amoxicillin Swelling and Rash  ? ? ?Chief Complaint  ?Patient presents with  ? Acute Visit  ?  Patient here for URI Symptoms and Sinuitis for the last week   ? ? ? ?HPI: Patient is a 88 y.o. female patient presents today with 10-day history of cough.  The cough is deep but nonproductive.  She has not had any fever.  She had COVID test which was negative and is up-to-date on her COVID immunizations.  She denies increased cough at night.  She does take omeprazole every other day for reflux symptoms.  She denies drainage sinus pain pressure.  She has not heard any wheezing when she breathes. ? ?Review of Systems:  ?Review of Systems  ?Constitutional: Negative.   ?HENT:  Negative for congestion.   ?Respiratory:  Positive for cough.   ?All other systems reviewed and are negative. ? ?Past Medical History:  ?Diagnosis Date  ? Abnormality of gait   ? Acute, but  ill-defined, cerebrovascular disease   ? Anxiety   ? Arthritis   ? Chronic kidney disease, stage II (mild)   ? Cystocele, midline   ? Dermatophytosis of the body   ? Disturbance of skin sensation   ? Dizziness and giddiness   ? Duodenal ulcer, unspecified as acute or chronic, without hemorrhage, perforation, or obstruction   ? Edema   ? Elevated blood pressure reading without diagnosis of hypertension   ? GERD (gastroesophageal reflux disease)   ? Hemorrhoids   ? Hypertension   ? Left bundle branch hemiblock   ? Lumbago   ? Macular degeneration (senile) of retina, unspecified   ? Myalgia and myositis, unspecified   ? Orthostatic hypotension   ? Other abnormal blood chemistry   ? Other malaise and fatigue   ? Personal history of fall   ? Polyneuropathy in other diseases classified elsewhere ([place]) 02/16/2014  ? Postmenopausal atrophic vaginitis   ? Primary cerebellar degeneration ([place])   ? Rectocele   ? Restless legs syndrome (RLS)   ? Unspecified constipation   ? Unspecified hereditary and idiopathic peripheral neuropathy   ? Urgency of urination   ? ?Past Surgical History:  ?Procedure Laterality Date  ? ABDOMINAL HYSTERECTOMY  1972  ? APPENDECTOMY  1945  ? CATARACT EXTRACTION Bilateral   ? EXTERNAL EAR SURGERY  2010  ? left outer ear basel cell removed  ? TONSILLECTOMY  1940  ? ?Social History: ?  reports that she has never  smoked. She has never used smokeless tobacco. She reports that she does not drink alcohol and does not use drugs. ? ?Family History  ?Problem Relation Age of Onset  ? Aortic stenosis Mother   ? Heart failure Mother   ? Thrombosis Father   ? Coronary artery disease Father   ? Ataxia Sister   ? Ataxia Brother   ? Heart disease Brother   ? Cancer Brother   ?     Prostate  ? Stroke Brother   ? ? ?Medications: ?Patient's Medications  ?New Prescriptions  ? No medications on file  ?Previous Medications  ? ACETAMINOPHEN (TYLENOL) 500 MG TABLET    Take 500 mg by mouth as needed.  ? CALCIUM CARBONATE (TUMS  - DOSED IN MG ELEMENTAL CALCIUM) 500 MG CHEWABLE TABLET    Chew 1 tablet by mouth as needed for indigestion or heartburn.   ? CHOLECALCIFEROL (VITAMIN D) 25 MCG (1000 UT) TABLET    Take 1,000 Units by mouth daily.  ? GABAPENTIN (NEURONTIN) 100 MG CAPSULE    TAKE 1 CAPSULE TWICE DAILY  ? HYDROCORTISONE 2.5 % CREAM    Apply topically 2 (two) times daily as needed.  ? LORAZEPAM (ATIVAN) 0.5 MG TABLET    TAKE 1/2 TABLET BY MOUTH AT BEDTIME  ? MULTIPLE VITAMIN (MULTIVITAMIN) TABLET    Take 1 tablet by mouth daily.  ? MULTIPLE VITAMINS-MINERALS (PRESERVISION AREDS 2 PO)    Take 1 tablet by mouth daily.  ? NIFEDIPINE (PROCARDIA XL/NIFEDICAL XL) 60 MG 24 HR TABLET    TAKE 1 TABLET DAILY FOR    BLOOD PRESSURE  ? OMEPRAZOLE (PRILOSEC OTC) 20 MG TABLET    Take 20 mg by mouth every other day.   ? PROBIOTIC PRODUCT (PROBIOTIC ADVANCED PO)    Take by mouth.  ?Modified Medications  ? No medications on file  ?Discontinued Medications  ? No medications on file  ? ? ?Physical Exam: ? ?There were no vitals filed for this visit. ?There is no height or weight on file to calculate BMI. ?Wt Readings from Last 3 Encounters:  ?12/05/21 144 lb 9.6 oz (65.6 kg)  ?09/14/21 146 lb (66.2 kg)  ?04/06/21 143 lb 9.6 oz (65.1 kg)  ? ? ?Physical Exam ?Vitals and nursing note reviewed.  ?Constitutional:   ?   Appearance: Normal appearance.  ?HENT:  ?   Right Ear: Tympanic membrane normal.  ?   Left Ear: Tympanic membrane normal.  ?   Nose: Nose normal.  ?   Mouth/Throat:  ?   Pharynx: Oropharynx is clear.  ?Cardiovascular:  ?   Rate and Rhythm: Normal rate and regular rhythm.  ?Pulmonary:  ?   Effort: Pulmonary effort is normal.  ?   Breath sounds: Normal breath sounds. No wheezing, rhonchi or rales.  ?Neurological:  ?   General: No focal deficit present.  ?   Mental Status: She is alert.  ?Psychiatric:     ?   Mood and Affect: Mood normal.     ?   Behavior: Behavior normal.  ? ? ?Labs reviewed: ?Basic Metabolic Panel: ?No results for input(s): NA, K,  CL, CO2, GLUCOSE, BUN, CREATININE, CALCIUM, MG, PHOS, TSH in the last 8760 hours. ?Liver Function Tests: ?No results for input(s): AST, ALT, ALKPHOS, BILITOT, PROT, ALBUMIN in the last 8760 hours. ?No results for input(s): LIPASE, AMYLASE in the last 8760 hours. ?No results for input(s): AMMONIA in the last 8760 hours. ?CBC: ?No results for input(s): WBC, NEUTROABS, HGB, HCT,  MCV, PLT in the last 8760 hours. ?Lipid Panel: ?No results for input(s): CHOL, HDL, LDLCALC, TRIG, CHOLHDL, LDLDIRECT in the last 8760 hours. ?TSH: ?No results for input(s): TSH in the last 8760 hours. ?A1C: ?Lab Results  ?Component Value Date  ? HGBA1C 5.5 08/18/2019  ? ? ? ?Assessment/Plan ? ? ?1. Subacute cough ?Exam does not show any evidence of bronchitis or pneumonia or sinus infection.  Patient does have a history of reflux disease and is only taking a PPI every other day.  This is a possibility.  We will treat symptoms by increasing omeprazole to daily and adding Tessalon Perles.  If the cough changes and she begins to cough up some colored sputum she is to call the office for possible antibiotic ? ?Alain Honey, MD ?[facility] ?[phone]  ? ?

## 2022-02-14 NOTE — Patient Instructions (Addendum)
Call for change phelgm ?Take omeprazole daily for 1 week ?

## 2022-02-23 DIAGNOSIS — Z23 Encounter for immunization: Secondary | ICD-10-CM | POA: Diagnosis not present

## 2022-03-01 DIAGNOSIS — N1832 Chronic kidney disease, stage 3b: Secondary | ICD-10-CM | POA: Diagnosis not present

## 2022-03-06 ENCOUNTER — Other Ambulatory Visit: Payer: Self-pay | Admitting: Neurology

## 2022-03-06 ENCOUNTER — Other Ambulatory Visit: Payer: Self-pay | Admitting: Nurse Practitioner

## 2022-03-06 NOTE — Telephone Encounter (Signed)
Patient has request refill on medication Nifedipine. Patient last refill dated 10/23/2021. Patient medication has allergy contraindications. Medication pend and sent to Ok Edwards, NP due to PCP Mast, Man X, NP being out of office.

## 2022-03-07 DIAGNOSIS — I129 Hypertensive chronic kidney disease with stage 1 through stage 4 chronic kidney disease, or unspecified chronic kidney disease: Secondary | ICD-10-CM | POA: Diagnosis not present

## 2022-03-07 DIAGNOSIS — D631 Anemia in chronic kidney disease: Secondary | ICD-10-CM | POA: Diagnosis not present

## 2022-03-07 DIAGNOSIS — N1832 Chronic kidney disease, stage 3b: Secondary | ICD-10-CM | POA: Diagnosis not present

## 2022-03-07 DIAGNOSIS — N2581 Secondary hyperparathyroidism of renal origin: Secondary | ICD-10-CM | POA: Diagnosis not present

## 2022-03-22 ENCOUNTER — Encounter: Payer: Medicare Other | Admitting: Nurse Practitioner

## 2022-03-28 ENCOUNTER — Encounter: Payer: Medicare Other | Admitting: Family Medicine

## 2022-04-03 ENCOUNTER — Ambulatory Visit (INDEPENDENT_AMBULATORY_CARE_PROVIDER_SITE_OTHER): Payer: Medicare Other | Admitting: Ophthalmology

## 2022-04-03 DIAGNOSIS — H353222 Exudative age-related macular degeneration, left eye, with inactive choroidal neovascularization: Secondary | ICD-10-CM | POA: Diagnosis not present

## 2022-04-03 DIAGNOSIS — H353212 Exudative age-related macular degeneration, right eye, with inactive choroidal neovascularization: Secondary | ICD-10-CM

## 2022-04-03 DIAGNOSIS — H353124 Nonexudative age-related macular degeneration, left eye, advanced atrophic with subfoveal involvement: Secondary | ICD-10-CM | POA: Diagnosis not present

## 2022-04-03 DIAGNOSIS — H353113 Nonexudative age-related macular degeneration, right eye, advanced atrophic without subfoveal involvement: Secondary | ICD-10-CM | POA: Diagnosis not present

## 2022-04-03 NOTE — Assessment & Plan Note (Signed)
No recurrence. 

## 2022-05-03 ENCOUNTER — Other Ambulatory Visit: Payer: Self-pay | Admitting: Nurse Practitioner

## 2022-05-03 DIAGNOSIS — F411 Generalized anxiety disorder: Secondary | ICD-10-CM

## 2022-05-03 NOTE — Telephone Encounter (Signed)
Pharmacy requested refill.  Pended Rx and sent to [facility] for approval.

## 2022-05-10 ENCOUNTER — Non-Acute Institutional Stay: Payer: Medicare Other | Admitting: Nurse Practitioner

## 2022-05-10 ENCOUNTER — Encounter: Payer: Self-pay | Admitting: Nurse Practitioner

## 2022-05-10 DIAGNOSIS — R269 Unspecified abnormalities of gait and mobility: Secondary | ICD-10-CM | POA: Diagnosis not present

## 2022-05-10 DIAGNOSIS — I10 Essential (primary) hypertension: Secondary | ICD-10-CM

## 2022-05-10 DIAGNOSIS — G63 Polyneuropathy in diseases classified elsewhere: Secondary | ICD-10-CM | POA: Diagnosis not present

## 2022-05-10 DIAGNOSIS — G4700 Insomnia, unspecified: Secondary | ICD-10-CM | POA: Diagnosis not present

## 2022-05-10 DIAGNOSIS — N8110 Cystocele, unspecified: Secondary | ICD-10-CM | POA: Diagnosis not present

## 2022-05-10 DIAGNOSIS — K623 Rectal prolapse: Secondary | ICD-10-CM

## 2022-05-10 DIAGNOSIS — K219 Gastro-esophageal reflux disease without esophagitis: Secondary | ICD-10-CM | POA: Diagnosis not present

## 2022-05-10 DIAGNOSIS — N184 Chronic kidney disease, stage 4 (severe): Secondary | ICD-10-CM | POA: Diagnosis not present

## 2022-05-10 NOTE — Assessment & Plan Note (Signed)
,   f/u Nephrology. Bun/creat 30/1.69 08/24/19

## 2022-05-10 NOTE — Assessment & Plan Note (Signed)
GERD, stable, on Omeprazole

## 2022-05-10 NOTE — Assessment & Plan Note (Signed)
Her mood is stable, on Lorazepam 0.'25mg'$  qhs.

## 2022-05-10 NOTE — Progress Notes (Signed)
Location:   [facility] Room Number: fox Place of Service:  Home (12) Provider: Marlana Latus NP  Code Status: DNR Goals of Care:     05/10/2022    9:16 AM  Advanced Directives  Does Patient Have a Medical Advance Directive? Yes  Type of Advance Directive [place]  Does patient want to make changes to medical advance directive? No - Patient declined  Copy of [place] in Chart? Yes - validated most recent copy scanned in chart (See row information)     Chief Complaint  Patient presents with   Medical Management of Chronic Issues    Patient is here for a follow up for chronic conditions, discuss need for updated vaccines     HPI: Patient is a 88 y.o. female seen today for medical management of chronic diseases.   HTN, blood pressure is controlled, on Nifedipine '60mg'$  qd.  GERD, stable, on Omeprazole  Her mood is stable, on Lorazepam 0.'25mg'$  qhs. Peripheral neuropathy in BLE, stable, on Gabapentin, follows neurology.  Cystocele, incontinent, grade 3, bladder bulges out the opening the vagina.  Hemorrhoids/rectal mucosa prolapse, bleeding on and off.  CKD stage 4, f/u Nephrology. Bun/creat 30/1.69 08/24/19   Past Medical History:  Diagnosis Date   Abnormality of gait    Acute, but ill-defined, cerebrovascular disease    Anxiety    Arthritis    Chronic kidney disease, stage II (mild)    Cystocele, midline    Dermatophytosis of the body    Disturbance of skin sensation    Dizziness and giddiness    Duodenal ulcer, unspecified as acute or chronic, without hemorrhage, perforation, or obstruction    Edema    Elevated blood pressure reading without diagnosis of hypertension    GERD (gastroesophageal reflux disease)    Hemorrhoids    Hypertension    Left bundle branch hemiblock    Lumbago    Macular degeneration (senile) of retina, unspecified    Myalgia and myositis, unspecified    Orthostatic hypotension    Other  abnormal blood chemistry    Other malaise and fatigue    Personal history of fall    Polyneuropathy in other diseases classified elsewhere ([place]) 02/16/2014   Postmenopausal atrophic vaginitis    Primary cerebellar degeneration (HCC)    Rectocele    Restless legs syndrome (RLS)    Unspecified constipation    Unspecified hereditary and idiopathic peripheral neuropathy    Urgency of urination     Past Surgical History:  Procedure Laterality Date   ABDOMINAL HYSTERECTOMY  1972   APPENDECTOMY  1945   CATARACT EXTRACTION Bilateral    EXTERNAL EAR SURGERY  2010   left outer ear basel cell removed   TONSILLECTOMY  1940    Allergies  Allergen Reactions   Amlodipine Other (See Comments)    Reaction:  Critically decreased sodium and potassium levels.     Enalapril Other (See Comments)    Reaction:  Unknown    Hctz [Hydrochlorothiazide] Other (See Comments)    Reaction:  Unknown    Penicillins Other (See Comments)    Reaction:  Unknown    Amoxicillin Swelling and Rash    Allergies as of 05/10/2022       Reactions   Amlodipine Other (See Comments)   Reaction:  Critically decreased sodium and potassium levels.     Enalapril Other (See Comments)   Reaction:  Unknown    Hctz [hydrochlorothiazide] Other (See Comments)  Reaction:  Unknown    Penicillins Other (See Comments)   Reaction:  Unknown    Amoxicillin Swelling, Rash        Medication List        Accurate as of May 10, 2022 11:59 PM. If you have any questions, ask your nurse or doctor.          acetaminophen 500 MG tablet Commonly known as: TYLENOL Take 500 mg by mouth as needed.   benzonatate 200 MG capsule Commonly known as: TESSALON Take 1 capsule (200 mg total) by mouth 2 (two) times daily as needed for cough.   calcium carbonate 500 MG chewable tablet Commonly known as: TUMS - dosed in mg elemental calcium Chew 1 tablet by mouth as needed for indigestion or heartburn.   cholecalciferol 25 MCG (1000  UNIT) tablet Commonly known as: VITAMIN D3 Take 1,000 Units by mouth daily.   gabapentin 100 MG capsule Commonly known as: NEURONTIN TAKE 1 CAPSULE TWICE DAILY   hydrocortisone 2.5 % cream Apply topically 2 (two) times daily as needed.   LORazepam 0.5 MG tablet Commonly known as: ATIVAN TAKE 1/2 TABLET BY MOUTH AT BEDTIME   multivitamin tablet Take 1 tablet by mouth daily.   NIFEdipine 60 MG 24 hr tablet Commonly known as: PROCARDIA XL/NIFEDICAL XL TAKE 1 TABLET DAILY FOR    BLOOD PRESSURE   omeprazole 20 MG tablet Commonly known as: PRILOSEC OTC Take 20 mg by mouth every other day.   PRESERVISION AREDS 2 PO Take 1 tablet by mouth daily.   PROBIOTIC ADVANCED PO Take by mouth.        Review of Systems:  Review of Systems  Constitutional:  Negative for fatigue, fever and unexpected weight change.  HENT:  Positive for hearing loss. Negative for congestion and voice change.   Eyes:  Negative for visual disturbance.  Respiratory:  Negative for cough and shortness of breath.   Cardiovascular:  Negative for leg swelling.  Gastrointestinal:  Negative for abdominal pain and constipation.       Alternating constipation, diarrhea if taking Laxative.   Genitourinary:  Positive for frequency. Negative for dysuria and urgency.       2-3x/night bathroom trips.   Musculoskeletal:  Positive for arthralgias and gait problem.  Skin:  Negative for color change.  Neurological:  Positive for numbness. Negative for speech difficulty, weakness and headaches.       Shooting pain in soles  Psychiatric/Behavioral:  Positive for sleep disturbance. Negative for behavioral problems. The patient is not nervous/anxious.     Health Maintenance  Topic Date Due   Zoster Vaccines- Shingrix (1 of 2) Never done   COVID-19 Vaccine (5 - Moderna series) 04/03/2021   INFLUENZA VACCINE  05/08/2022   TETANUS/TDAP  09/04/2027   Pneumonia Vaccine 48+ Years old  Completed   HPV VACCINES  Aged Out    DEXA SCAN  Discontinued    Physical Exam: Vitals:   05/10/22 1414  BP: 126/60  Pulse: 74  Resp: 16  Temp: (!) 96.7 F (35.9 C)  SpO2: 97%  Weight: 147 lb (66.7 kg)  Height: '5\' 5"'$  (1.651 m)   Body mass index is 24.46 kg/m. Physical Exam Vitals and nursing note reviewed.  Constitutional:      Appearance: Normal appearance. She is normal weight.  HENT:     Head: Normocephalic and atraumatic.     Mouth/Throat:     Mouth: Mucous membranes are moist.  Eyes:     Extraocular Movements:  Extraocular movements intact.     Conjunctiva/sclera: Conjunctivae normal.     Pupils: Pupils are equal, round, and reactive to light.  Cardiovascular:     Rate and Rhythm: Normal rate and regular rhythm.     Heart sounds: Murmur heard.     Comments: Weak DP pulses.  Pulmonary:     Breath sounds: No rales.  Abdominal:     General: Bowel sounds are normal.     Palpations: Abdomen is soft.     Tenderness: There is no abdominal tenderness.  Genitourinary:    Comments: Complete cystocele, rectal mucosa prolapse from previous examination.  Musculoskeletal:     Cervical back: Normal range of motion and neck supple.     Right lower leg: No edema.     Left lower leg: No edema.  Skin:    General: Skin is warm and dry.  Neurological:     General: No focal deficit present.     Mental Status: She is alert and oriented to person, place, and time. Mental status is at baseline.     Motor: No weakness.     Coordination: Coordination normal.     Gait: Gait abnormal.  Psychiatric:        Mood and Affect: Mood normal.        Behavior: Behavior normal.        Thought Content: Thought content normal.        Judgment: Judgment normal.     Labs reviewed: Basic Metabolic Panel: No results for input(s): "NA", "K", "CL", "CO2", "GLUCOSE", "BUN", "CREATININE", "CALCIUM", "MG", "PHOS", "TSH" in the last 8760 hours. Liver Function Tests: No results for input(s): "AST", "ALT", "ALKPHOS", "BILITOT", "PROT",  "ALBUMIN" in the last 8760 hours. No results for input(s): "LIPASE", "AMYLASE" in the last 8760 hours. No results for input(s): "AMMONIA" in the last 8760 hours. CBC: No results for input(s): "WBC", "NEUTROABS", "HGB", "HCT", "MCV", "PLT" in the last 8760 hours. Lipid Panel: No results for input(s): "CHOL", "HDL", "LDLCALC", "TRIG", "CHOLHDL", "LDLDIRECT" in the last 8760 hours. Lab Results  Component Value Date   HGBA1C 5.5 08/18/2019    Procedures since last visit: No results found.  Assessment/Plan  HTN (hypertension) blood pressure is controlled, on Nifedipine '60mg'$  qd.   GERD (gastroesophageal reflux disease) GERD, stable, on Omeprazole   Insomnia Her mood is stable, on Lorazepam 0.'25mg'$  qhs.   Peripheral neuropathy Peripheral neuropathy in BLE, stable, on Gabapentin, follows neurology.   Pelvic organ prolapse quantification stage 3 cystocele Cystocele, incontinent, grade 3, bladder bulges out the opening the vagina.   Rectal mucosa prolapse Hemorrhoids/rectal mucosa prolapse, bleeding on and off  CKD (chronic kidney disease) stage 4, GFR 15-29 ml/min (HCC) , f/u Nephrology. Bun/creat 30/1.69 08/24/19  Abnormality of gait Progressing of peripheral neuropathy in BLE is contributory to her unsteady gait, already uses walker, will have therapy to eval and treat   Labs/tests ordered:  none  Next appt:  6 months.

## 2022-05-10 NOTE — Assessment & Plan Note (Signed)
blood pressure is controlled, on Nifedipine '60mg'$  qd.

## 2022-05-10 NOTE — Assessment & Plan Note (Signed)
Cystocele, incontinent, grade 3, bladder bulges out the opening the vagina.

## 2022-05-10 NOTE — Assessment & Plan Note (Signed)
Peripheral neuropathy in BLE, stable, on Gabapentin, follows neurology.

## 2022-05-10 NOTE — Assessment & Plan Note (Signed)
Hemorrhoids/rectal mucosa prolapse, bleeding on and off

## 2022-05-11 ENCOUNTER — Encounter: Payer: Self-pay | Admitting: Nurse Practitioner

## 2022-05-11 NOTE — Assessment & Plan Note (Signed)
Progressing of peripheral neuropathy in BLE is contributory to her unsteady gait, already uses walker, will have therapy to eval and treat

## 2022-05-20 ENCOUNTER — Other Ambulatory Visit: Payer: Self-pay | Admitting: Adult Health

## 2022-05-29 DIAGNOSIS — R2681 Unsteadiness on feet: Secondary | ICD-10-CM | POA: Diagnosis not present

## 2022-05-29 DIAGNOSIS — G112 Late-onset cerebellar ataxia: Secondary | ICD-10-CM | POA: Diagnosis not present

## 2022-05-29 DIAGNOSIS — G609 Hereditary and idiopathic neuropathy, unspecified: Secondary | ICD-10-CM | POA: Diagnosis not present

## 2022-05-29 DIAGNOSIS — M6281 Muscle weakness (generalized): Secondary | ICD-10-CM | POA: Diagnosis not present

## 2022-05-29 DIAGNOSIS — R29898 Other symptoms and signs involving the musculoskeletal system: Secondary | ICD-10-CM | POA: Diagnosis not present

## 2022-06-04 DIAGNOSIS — R29898 Other symptoms and signs involving the musculoskeletal system: Secondary | ICD-10-CM | POA: Diagnosis not present

## 2022-06-04 DIAGNOSIS — G609 Hereditary and idiopathic neuropathy, unspecified: Secondary | ICD-10-CM | POA: Diagnosis not present

## 2022-06-04 DIAGNOSIS — M6281 Muscle weakness (generalized): Secondary | ICD-10-CM | POA: Diagnosis not present

## 2022-06-04 DIAGNOSIS — G112 Late-onset cerebellar ataxia: Secondary | ICD-10-CM | POA: Diagnosis not present

## 2022-06-04 DIAGNOSIS — R2681 Unsteadiness on feet: Secondary | ICD-10-CM | POA: Diagnosis not present

## 2022-06-05 ENCOUNTER — Telehealth: Payer: Self-pay | Admitting: *Deleted

## 2022-06-05 DIAGNOSIS — R29898 Other symptoms and signs involving the musculoskeletal system: Secondary | ICD-10-CM | POA: Diagnosis not present

## 2022-06-05 DIAGNOSIS — M6281 Muscle weakness (generalized): Secondary | ICD-10-CM | POA: Diagnosis not present

## 2022-06-05 DIAGNOSIS — G609 Hereditary and idiopathic neuropathy, unspecified: Secondary | ICD-10-CM | POA: Diagnosis not present

## 2022-06-05 DIAGNOSIS — R2681 Unsteadiness on feet: Secondary | ICD-10-CM | POA: Diagnosis not present

## 2022-06-05 DIAGNOSIS — G112 Late-onset cerebellar ataxia: Secondary | ICD-10-CM | POA: Diagnosis not present

## 2022-06-05 NOTE — Telephone Encounter (Signed)
Received fax from [facility] [phone], Dr. Perlie Gold,  Requesting Medical Consent for Dental Treatment.   Placed form in ManXie's mailbox to review, fill out and sign.

## 2022-06-08 DIAGNOSIS — G609 Hereditary and idiopathic neuropathy, unspecified: Secondary | ICD-10-CM | POA: Diagnosis not present

## 2022-06-08 DIAGNOSIS — R29898 Other symptoms and signs involving the musculoskeletal system: Secondary | ICD-10-CM | POA: Diagnosis not present

## 2022-06-08 DIAGNOSIS — R2681 Unsteadiness on feet: Secondary | ICD-10-CM | POA: Diagnosis not present

## 2022-06-08 DIAGNOSIS — M6281 Muscle weakness (generalized): Secondary | ICD-10-CM | POA: Diagnosis not present

## 2022-06-08 DIAGNOSIS — G112 Late-onset cerebellar ataxia: Secondary | ICD-10-CM | POA: Diagnosis not present

## 2022-06-12 DIAGNOSIS — M6281 Muscle weakness (generalized): Secondary | ICD-10-CM | POA: Diagnosis not present

## 2022-06-12 DIAGNOSIS — G609 Hereditary and idiopathic neuropathy, unspecified: Secondary | ICD-10-CM | POA: Diagnosis not present

## 2022-06-12 DIAGNOSIS — G112 Late-onset cerebellar ataxia: Secondary | ICD-10-CM | POA: Diagnosis not present

## 2022-06-12 DIAGNOSIS — R2681 Unsteadiness on feet: Secondary | ICD-10-CM | POA: Diagnosis not present

## 2022-06-12 DIAGNOSIS — R29898 Other symptoms and signs involving the musculoskeletal system: Secondary | ICD-10-CM | POA: Diagnosis not present

## 2022-06-15 DIAGNOSIS — M6281 Muscle weakness (generalized): Secondary | ICD-10-CM | POA: Diagnosis not present

## 2022-06-15 DIAGNOSIS — G609 Hereditary and idiopathic neuropathy, unspecified: Secondary | ICD-10-CM | POA: Diagnosis not present

## 2022-06-15 DIAGNOSIS — G112 Late-onset cerebellar ataxia: Secondary | ICD-10-CM | POA: Diagnosis not present

## 2022-06-15 DIAGNOSIS — R2681 Unsteadiness on feet: Secondary | ICD-10-CM | POA: Diagnosis not present

## 2022-06-15 DIAGNOSIS — R29898 Other symptoms and signs involving the musculoskeletal system: Secondary | ICD-10-CM | POA: Diagnosis not present

## 2022-06-19 DIAGNOSIS — R2681 Unsteadiness on feet: Secondary | ICD-10-CM | POA: Diagnosis not present

## 2022-06-19 DIAGNOSIS — G112 Late-onset cerebellar ataxia: Secondary | ICD-10-CM | POA: Diagnosis not present

## 2022-06-19 DIAGNOSIS — M6281 Muscle weakness (generalized): Secondary | ICD-10-CM | POA: Diagnosis not present

## 2022-06-19 DIAGNOSIS — R29898 Other symptoms and signs involving the musculoskeletal system: Secondary | ICD-10-CM | POA: Diagnosis not present

## 2022-06-19 DIAGNOSIS — G609 Hereditary and idiopathic neuropathy, unspecified: Secondary | ICD-10-CM | POA: Diagnosis not present

## 2022-06-22 ENCOUNTER — Telehealth: Payer: Self-pay | Admitting: *Deleted

## 2022-06-22 DIAGNOSIS — M6281 Muscle weakness (generalized): Secondary | ICD-10-CM | POA: Diagnosis not present

## 2022-06-22 DIAGNOSIS — G609 Hereditary and idiopathic neuropathy, unspecified: Secondary | ICD-10-CM | POA: Diagnosis not present

## 2022-06-22 DIAGNOSIS — R29898 Other symptoms and signs involving the musculoskeletal system: Secondary | ICD-10-CM | POA: Diagnosis not present

## 2022-06-22 DIAGNOSIS — G112 Late-onset cerebellar ataxia: Secondary | ICD-10-CM | POA: Diagnosis not present

## 2022-06-22 DIAGNOSIS — R2681 Unsteadiness on feet: Secondary | ICD-10-CM | POA: Diagnosis not present

## 2022-06-22 NOTE — Telephone Encounter (Signed)
Received fax from [facility] for Medical Clearance for Dental Treatment.   Appointment scheduled for 06/28/2022 at [facility] with [facility].   Form given to [facility] for Clinic.   Once completed to be faxed to [facility] Fax:[phone]

## 2022-06-26 DIAGNOSIS — G609 Hereditary and idiopathic neuropathy, unspecified: Secondary | ICD-10-CM | POA: Diagnosis not present

## 2022-06-26 DIAGNOSIS — M6281 Muscle weakness (generalized): Secondary | ICD-10-CM | POA: Diagnosis not present

## 2022-06-26 DIAGNOSIS — R2681 Unsteadiness on feet: Secondary | ICD-10-CM | POA: Diagnosis not present

## 2022-06-26 DIAGNOSIS — G112 Late-onset cerebellar ataxia: Secondary | ICD-10-CM | POA: Diagnosis not present

## 2022-06-26 DIAGNOSIS — R29898 Other symptoms and signs involving the musculoskeletal system: Secondary | ICD-10-CM | POA: Diagnosis not present

## 2022-06-28 ENCOUNTER — Non-Acute Institutional Stay: Payer: Medicare Other | Admitting: Nurse Practitioner

## 2022-06-28 ENCOUNTER — Encounter: Payer: Self-pay | Admitting: Nurse Practitioner

## 2022-06-28 VITALS — BP 126/74 | HR 79 | Temp 97.1°F | Resp 18 | Ht 65.0 in | Wt 142.0 lb

## 2022-06-28 DIAGNOSIS — E559 Vitamin D deficiency, unspecified: Secondary | ICD-10-CM | POA: Diagnosis not present

## 2022-06-28 DIAGNOSIS — G63 Polyneuropathy in diseases classified elsewhere: Secondary | ICD-10-CM

## 2022-06-28 DIAGNOSIS — Z780 Asymptomatic menopausal state: Secondary | ICD-10-CM | POA: Diagnosis not present

## 2022-06-28 DIAGNOSIS — K219 Gastro-esophageal reflux disease without esophagitis: Secondary | ICD-10-CM | POA: Diagnosis not present

## 2022-06-28 DIAGNOSIS — N184 Chronic kidney disease, stage 4 (severe): Secondary | ICD-10-CM

## 2022-06-28 DIAGNOSIS — K623 Rectal prolapse: Secondary | ICD-10-CM

## 2022-06-28 DIAGNOSIS — I10 Essential (primary) hypertension: Secondary | ICD-10-CM

## 2022-06-28 DIAGNOSIS — N8110 Cystocele, unspecified: Secondary | ICD-10-CM | POA: Diagnosis not present

## 2022-06-28 DIAGNOSIS — F411 Generalized anxiety disorder: Secondary | ICD-10-CM | POA: Diagnosis not present

## 2022-06-28 NOTE — Assessment & Plan Note (Addendum)
f/u Nephrology-called: the patient has no labs from their office about a year. Bun/creat 30/1.69 08/24/19.

## 2022-06-28 NOTE — Assessment & Plan Note (Signed)
blood pressure is controlled, on Nifedipine '60mg'$  qd.

## 2022-06-28 NOTE — Progress Notes (Addendum)
Location:  [facility] (12) Provider: Avika Carbine X, NP  Patient Care Team: Jeydi Klingel X, NP as PCP - General (Internal Medicine) Lavonna Monarch, MD (Inactive) as Consulting Physician (Dermatology) Clent Jacks, MD as Consulting Physician (Ophthalmology) Elmarie Shiley, MD as Consulting Physician (Nephrology) Zadie Rhine Clent Demark, MD as Consulting Physician (Ophthalmology) [place] Mayjor Ager X, NP as Nurse Practitioner (Internal Medicine)  Extended Emergency Contact Information Primary Emergency Contact: Nazari,James Address: [place] [address] [address] of [place] Phone: [phone] Mobile Phone: [phone] Relation: Son Secondary Emergency Contact: Kerry Kass [place] of [place] Phone: [phone] Work Phone: [phone] Mobile Phone: [phone] Relation: Daughter  Code Status:  Full Goals of care: Advanced Directive information    05/10/2022    9:16 AM  Advanced Directives  Does Patient Have a Medical Advance Directive? Yes  Type of Advance Directive [place]  Does patient want to make changes to medical advance directive? No - Patient declined  Copy of [place] in Chart? Yes - validated most recent copy scanned in chart (See row information)     Chief Complaint  Patient presents with  . Acute Visit    Patient is being seen for a medical clearance for dentistry     HPI:  Pt is a 88 y.o. female seen today for an acute visit for medical clearance for dentistry.    HTN, blood pressure is controlled, on Nifedipine $RemoveBefor'60mg'MUnieRoHNddp$  qd.  GERD, stable, on Omeprazole  Her mood is stable, on Lorazepam 0.$RemoveBefore'25mg'VLWEruWtScmHI$  qhs.  Peripheral neuropathy in BLE, stable, on Gabapentin, follows neurology.  Cystocele, incontinent, grade 3, bladder bulges out the opening the vagina.  Hemorrhoids/rectal mucosa prolapse, bleeding on and off.  CKD stage 4, f/u Nephrology.  Bun/creat 30/1.69 08/24/19  Past Medical History:  Diagnosis Date  . Abnormality of gait   . Acute, but ill-defined, cerebrovascular disease   . Anxiety   . Arthritis   . Chronic kidney disease, stage II (mild)   . Cystocele, midline   . Dermatophytosis of the body   . Disturbance of skin sensation   . Dizziness and giddiness   . Duodenal ulcer, unspecified as acute or chronic, without hemorrhage, perforation, or obstruction   . Edema   . Elevated blood pressure reading without diagnosis of hypertension   . GERD (gastroesophageal reflux disease)   . Hemorrhoids   . Hypertension   . Left bundle branch hemiblock   . Lumbago   . Macular degeneration (senile) of retina, unspecified   . Myalgia and myositis, unspecified   . Orthostatic hypotension   . Other abnormal blood chemistry   . Other malaise and fatigue   . Personal history of fall   . Polyneuropathy in other diseases classified elsewhere ([place]) 02/16/2014  . Postmenopausal atrophic vaginitis   . Primary cerebellar degeneration ([place])   . Rectocele   . Restless legs syndrome (RLS)   . Unspecified constipation   . Unspecified hereditary and idiopathic peripheral neuropathy   . Urgency of urination    Past Surgical History:  Procedure Laterality Date  . ABDOMINAL HYSTERECTOMY  1972  . APPENDECTOMY  1945  . CATARACT EXTRACTION Bilateral   . EXTERNAL EAR SURGERY  2010   left outer ear basel cell removed  . TONSILLECTOMY  1940    Allergies  Allergen Reactions  . Amlodipine Other (See Comments)  Reaction:  Critically decreased sodium and potassium levels.    . Enalapril Other (See Comments)    Reaction:  Unknown   . Hctz [Hydrochlorothiazide] Other (See Comments)    Reaction:  Unknown   . Penicillins Other (See Comments)    Reaction:  Unknown   . Amoxicillin Swelling and Rash    Outpatient Encounter Medications as of 06/28/2022  Medication Sig  . acetaminophen (TYLENOL) 500 MG tablet Take 500 mg by mouth as  needed.  . benzonatate (TESSALON) 200 MG capsule Take 1 capsule (200 mg total) by mouth 2 (two) times daily as needed for cough.  . calcium carbonate (TUMS - DOSED IN MG ELEMENTAL CALCIUM) 500 MG chewable tablet Chew 1 tablet by mouth as needed for indigestion or heartburn.   . cholecalciferol (VITAMIN D) 25 MCG (1000 UT) tablet Take 1,000 Units by mouth daily.  Marland Kitchen gabapentin (NEURONTIN) 100 MG capsule TAKE 1 CAPSULE TWICE DAILY  . hydrocortisone 2.5 % cream Apply topically 2 (two) times daily as needed.  Marland Kitchen LORazepam (ATIVAN) 0.5 MG tablet TAKE 1/2 TABLET BY MOUTH AT BEDTIME  . Multiple Vitamin (MULTIVITAMIN) tablet Take 1 tablet by mouth daily.  . Multiple Vitamins-Minerals (PRESERVISION AREDS 2 PO) Take 1 tablet by mouth daily.  Marland Kitchen NIFEdipine (PROCARDIA XL/NIFEDICAL XL) 60 MG 24 hr tablet TAKE 1 TABLET DAILY FOR    BLOOD PRESSURE  . omeprazole (PRILOSEC OTC) 20 MG tablet Take 20 mg by mouth every other day.   . Probiotic Product (PROBIOTIC ADVANCED PO) Take by mouth.   No facility-administered encounter medications on file as of 06/28/2022.    Review of Systems  Constitutional:  Negative for fatigue, fever and unexpected weight change.  HENT:  Positive for hearing loss. Negative for congestion and voice change.   Eyes:  Negative for visual disturbance.  Respiratory:  Negative for cough and shortness of breath.   Cardiovascular:  Negative for leg swelling.  Gastrointestinal:  Negative for abdominal pain and constipation.       Alternating constipation, diarrhea if taking Laxative.   Genitourinary:  Positive for frequency. Negative for dysuria and urgency.       2-3x/night bathroom trips.   Musculoskeletal:  Positive for arthralgias and gait problem.  Skin:  Negative for color change.  Neurological:  Positive for numbness. Negative for speech difficulty, weakness and headaches.       Shooting pain in soles  Psychiatric/Behavioral:  Positive for sleep disturbance. Negative for behavioral  problems. The patient is not nervous/anxious.     Immunization History  Administered Date(s) Administered  . Fluad Quad(high Dose 65+) 07/24/2021  . Influenza Whole 07/08/2012, 07/10/2018  . Influenza, High Dose Seasonal PF 07/17/2017, 07/22/2019, 07/20/2020  . Influenza-Unspecified 07/26/2014, 06/09/2015, 07/19/2016  . Moderna Sars-Covid-2 Vaccination 10/12/2019, 11/09/2019, 08/16/2020, 02/06/2021  . PPD Test 09/07/1982  . Pneumococcal Conjugate-13 09/03/2017  . Pneumococcal Polysaccharide-23 10/08/1994  . Td 10/09/1995  . Tdap 09/03/2017  . Zoster, Live 10/08/2005   Pertinent  Health Maintenance Due  Topic Date Due  . INFLUENZA VACCINE  05/08/2022  . DEXA SCAN  Discontinued      11/10/2020    2:32 PM 04/06/2021    2:08 PM 09/14/2021    2:29 PM 11/23/2021    2:40 PM 02/14/2022    2:41 PM  Fall Risk  Falls in the past year? 0 0 0 1 0  Was there an injury with Fall? 0 0 0 0 0  Fall Risk Category Calculator 0 0 0 2 0  Fall Risk Category Low Low Low Moderate Low  Patient Fall Risk Level Low fall risk Low fall risk Low fall risk Moderate fall risk Low fall risk  Patient at Risk for Falls Due to  No Fall Risks No Fall Risks History of fall(s) No Fall Risks  Fall risk Follow up  Falls evaluation completed Falls evaluation completed Falls evaluation completed Falls evaluation completed   Functional Status Survey:    Vitals:   06/28/22 1530  BP: 126/74  Pulse: 79  Resp: 18  Temp: (!) 97.1 F (36.2 C)  TempSrc: Temporal  SpO2: 97%  Weight: 142 lb (64.4 kg)  Height: $Remove'5\' 5"'enUYUEx$  (1.651 m)   Body mass index is 23.63 kg/m. Physical Exam Vitals and nursing note reviewed.  Constitutional:      Appearance: Normal appearance. She is normal weight.  HENT:     Head: Normocephalic and atraumatic.     Nose: No congestion or rhinorrhea.     Mouth/Throat:     Mouth: Mucous membranes are moist.     Comments: The right lower molar needs extraction, has crown, no pain complained.  Eyes:      Extraocular Movements: Extraocular movements intact.     Conjunctiva/sclera: Conjunctivae normal.     Pupils: Pupils are equal, round, and reactive to light.  Cardiovascular:     Rate and Rhythm: Normal rate and regular rhythm.     Heart sounds: Murmur heard.     Comments: Weak DP pulses.  Pulmonary:     Breath sounds: No rales.  Abdominal:     General: Bowel sounds are normal.     Palpations: Abdomen is soft.     Tenderness: There is no abdominal tenderness.  Genitourinary:    Comments: Complete cystocele, rectal mucosa prolapse from previous examination.  Musculoskeletal:     Cervical back: Normal range of motion and neck supple.     Right lower leg: No edema.     Left lower leg: No edema.  Skin:    General: Skin is warm and dry.  Neurological:     General: No focal deficit present.     Mental Status: She is alert and oriented to person, place, and time. Mental status is at baseline.     Motor: No weakness.     Coordination: Coordination normal.     Gait: Gait abnormal.  Psychiatric:        Mood and Affect: Mood normal.        Behavior: Behavior normal.        Thought Content: Thought content normal.        Judgment: Judgment normal.    Labs reviewed: No results for input(s): "NA", "K", "CL", "CO2", "GLUCOSE", "BUN", "CREATININE", "CALCIUM", "MG", "PHOS" in the last 8760 hours. No results for input(s): "AST", "ALT", "ALKPHOS", "BILITOT", "PROT", "ALBUMIN" in the last 8760 hours. No results for input(s): "WBC", "NEUTROABS", "HGB", "HCT", "MCV", "PLT" in the last 8760 hours. Lab Results  Component Value Date   TSH 3.83 10/07/2018   Lab Results  Component Value Date   HGBA1C 5.5 08/18/2019   Lab Results  Component Value Date   CHOL 213 (H) 08/18/2019   HDL 34 (L) 08/18/2019   LDLCALC 150 (H) 08/18/2019   TRIG 155 (H) 08/18/2019   CHOLHDL 6.3 (H) 08/18/2019    Significant Diagnostic Results in last 30 days:  No results found.  Assessment/Plan HTN  (hypertension) blood pressure is controlled, on Nifedipine $RemoveBefor'60mg'veMvrSiddUnu$  qd.   GERD (gastroesophageal reflux disease) stable,  on Omeprazole   Generalized anxiety disorder Her mood is stable, on Lorazepam 0.$RemoveBefore'25mg'MUpgtpEYtNxvM$  qhs.  Polyneuropathy in other diseases classified elsewhere [facility]) Peripheral neuropathy in BLE, stable, on Gabapentin, follows neurology.   Pelvic organ prolapse quantification stage 3 cystocele Cystocele, incontinent, grade 3, bladder bulges out the opening the vagina.   Rectal mucosa prolapse Hemorrhoids/rectal mucosa prolapse, bleeding on and off.   CKD (chronic kidney disease) stage 4, GFR 15-29 ml/min (HCC) f/u Nephrology-called: the patient has no labs from their office about a year. Bun/creat 30/1.69 08/24/19.   Vitamin D deficiency Repeat Vit D level.   Form for dental procedure clearance completed during today's visit.    Family/ staff Communication: plan of care reviewed with the patient and charge nurse.   Labs/tests ordered:  none  from Nephrology.  Will obtain CBC/diff, CMP/eGFR, TSH, lipid panel, Vit B 12, Vit D level.   Next appointment prn

## 2022-06-28 NOTE — Assessment & Plan Note (Signed)
stable, on Omeprazole.  

## 2022-06-28 NOTE — Assessment & Plan Note (Signed)
Her mood is stable, on Lorazepam 0.'25mg'$  qhs.

## 2022-06-28 NOTE — Assessment & Plan Note (Signed)
Cystocele, incontinent, grade 3, bladder bulges out the opening the vagina.

## 2022-06-28 NOTE — Assessment & Plan Note (Signed)
Hemorrhoids/rectal mucosa prolapse, bleeding on and off.

## 2022-06-28 NOTE — Assessment & Plan Note (Signed)
Peripheral neuropathy in BLE, stable, on Gabapentin, follows neurology.

## 2022-06-29 ENCOUNTER — Encounter: Payer: Self-pay | Admitting: Nurse Practitioner

## 2022-06-29 DIAGNOSIS — R2681 Unsteadiness on feet: Secondary | ICD-10-CM | POA: Diagnosis not present

## 2022-06-29 DIAGNOSIS — G609 Hereditary and idiopathic neuropathy, unspecified: Secondary | ICD-10-CM | POA: Diagnosis not present

## 2022-06-29 DIAGNOSIS — G112 Late-onset cerebellar ataxia: Secondary | ICD-10-CM | POA: Diagnosis not present

## 2022-06-29 DIAGNOSIS — R29898 Other symptoms and signs involving the musculoskeletal system: Secondary | ICD-10-CM | POA: Diagnosis not present

## 2022-06-29 DIAGNOSIS — M6281 Muscle weakness (generalized): Secondary | ICD-10-CM | POA: Diagnosis not present

## 2022-06-29 NOTE — Assessment & Plan Note (Signed)
Repeat Vit D level.

## 2022-07-03 ENCOUNTER — Other Ambulatory Visit: Payer: Medicare Other

## 2022-07-03 DIAGNOSIS — R2681 Unsteadiness on feet: Secondary | ICD-10-CM | POA: Diagnosis not present

## 2022-07-03 DIAGNOSIS — M6281 Muscle weakness (generalized): Secondary | ICD-10-CM | POA: Diagnosis not present

## 2022-07-03 DIAGNOSIS — N184 Chronic kidney disease, stage 4 (severe): Secondary | ICD-10-CM

## 2022-07-03 DIAGNOSIS — G609 Hereditary and idiopathic neuropathy, unspecified: Secondary | ICD-10-CM | POA: Diagnosis not present

## 2022-07-03 DIAGNOSIS — E559 Vitamin D deficiency, unspecified: Secondary | ICD-10-CM

## 2022-07-03 DIAGNOSIS — R29898 Other symptoms and signs involving the musculoskeletal system: Secondary | ICD-10-CM | POA: Diagnosis not present

## 2022-07-03 DIAGNOSIS — F411 Generalized anxiety disorder: Secondary | ICD-10-CM

## 2022-07-03 DIAGNOSIS — G112 Late-onset cerebellar ataxia: Secondary | ICD-10-CM | POA: Diagnosis not present

## 2022-07-03 DIAGNOSIS — I10 Essential (primary) hypertension: Secondary | ICD-10-CM

## 2022-07-17 DIAGNOSIS — H0102A Squamous blepharitis right eye, upper and lower eyelids: Secondary | ICD-10-CM | POA: Diagnosis not present

## 2022-07-17 DIAGNOSIS — H0102B Squamous blepharitis left eye, upper and lower eyelids: Secondary | ICD-10-CM | POA: Diagnosis not present

## 2022-07-17 DIAGNOSIS — H4321 Crystalline deposits in vitreous body, right eye: Secondary | ICD-10-CM | POA: Diagnosis not present

## 2022-07-17 DIAGNOSIS — H353132 Nonexudative age-related macular degeneration, bilateral, intermediate dry stage: Secondary | ICD-10-CM | POA: Diagnosis not present

## 2022-07-17 DIAGNOSIS — H35371 Puckering of macula, right eye: Secondary | ICD-10-CM | POA: Diagnosis not present

## 2022-07-17 DIAGNOSIS — Z961 Presence of intraocular lens: Secondary | ICD-10-CM | POA: Diagnosis not present

## 2022-07-17 DIAGNOSIS — H40013 Open angle with borderline findings, low risk, bilateral: Secondary | ICD-10-CM | POA: Diagnosis not present

## 2022-07-24 DIAGNOSIS — Z23 Encounter for immunization: Secondary | ICD-10-CM | POA: Diagnosis not present

## 2022-08-02 ENCOUNTER — Other Ambulatory Visit: Payer: Self-pay | Admitting: Adult Health

## 2022-08-05 ENCOUNTER — Other Ambulatory Visit: Payer: Self-pay | Admitting: Adult Health

## 2022-08-09 DIAGNOSIS — Z23 Encounter for immunization: Secondary | ICD-10-CM | POA: Diagnosis not present

## 2022-08-14 DIAGNOSIS — M6281 Muscle weakness (generalized): Secondary | ICD-10-CM | POA: Diagnosis not present

## 2022-08-14 DIAGNOSIS — R2681 Unsteadiness on feet: Secondary | ICD-10-CM | POA: Diagnosis not present

## 2022-08-16 ENCOUNTER — Other Ambulatory Visit: Payer: Self-pay | Admitting: *Deleted

## 2022-08-16 MED ORDER — GABAPENTIN 100 MG PO CAPS: 100.0000 mg | ORAL_CAPSULE | Freq: Two times a day (BID) | ORAL | 1 refills | Status: AC

## 2022-08-16 NOTE — Telephone Encounter (Signed)
Patient called requesting refill on medication. Stated that her Neurologist refilled it in the past but since released her.   Pended Rx and sent to [facility] for approval.

## 2022-08-17 DIAGNOSIS — M6281 Muscle weakness (generalized): Secondary | ICD-10-CM | POA: Diagnosis not present

## 2022-08-17 DIAGNOSIS — R2681 Unsteadiness on feet: Secondary | ICD-10-CM | POA: Diagnosis not present

## 2022-08-21 DIAGNOSIS — R2681 Unsteadiness on feet: Secondary | ICD-10-CM | POA: Diagnosis not present

## 2022-08-21 DIAGNOSIS — M6281 Muscle weakness (generalized): Secondary | ICD-10-CM | POA: Diagnosis not present

## 2022-08-24 DIAGNOSIS — M6281 Muscle weakness (generalized): Secondary | ICD-10-CM | POA: Diagnosis not present

## 2022-08-24 DIAGNOSIS — R2681 Unsteadiness on feet: Secondary | ICD-10-CM | POA: Diagnosis not present

## 2022-08-28 DIAGNOSIS — R2681 Unsteadiness on feet: Secondary | ICD-10-CM | POA: Diagnosis not present

## 2022-08-28 DIAGNOSIS — M6281 Muscle weakness (generalized): Secondary | ICD-10-CM | POA: Diagnosis not present

## 2022-09-04 DIAGNOSIS — R2681 Unsteadiness on feet: Secondary | ICD-10-CM | POA: Diagnosis not present

## 2022-09-04 DIAGNOSIS — M6281 Muscle weakness (generalized): Secondary | ICD-10-CM | POA: Diagnosis not present

## 2022-09-07 DIAGNOSIS — R2681 Unsteadiness on feet: Secondary | ICD-10-CM | POA: Diagnosis not present

## 2022-09-07 DIAGNOSIS — M6281 Muscle weakness (generalized): Secondary | ICD-10-CM | POA: Diagnosis not present

## 2022-09-11 DIAGNOSIS — H353222 Exudative age-related macular degeneration, left eye, with inactive choroidal neovascularization: Secondary | ICD-10-CM | POA: Diagnosis not present

## 2022-09-11 DIAGNOSIS — H353213 Exudative age-related macular degeneration, right eye, with inactive scar: Secondary | ICD-10-CM | POA: Diagnosis not present

## 2022-09-11 DIAGNOSIS — H353113 Nonexudative age-related macular degeneration, right eye, advanced atrophic without subfoveal involvement: Secondary | ICD-10-CM | POA: Diagnosis not present

## 2022-09-11 DIAGNOSIS — H353124 Nonexudative age-related macular degeneration, left eye, advanced atrophic with subfoveal involvement: Secondary | ICD-10-CM | POA: Diagnosis not present

## 2022-09-18 DIAGNOSIS — R2681 Unsteadiness on feet: Secondary | ICD-10-CM | POA: Diagnosis not present

## 2022-09-18 DIAGNOSIS — M6281 Muscle weakness (generalized): Secondary | ICD-10-CM | POA: Diagnosis not present

## 2022-09-21 DIAGNOSIS — R2681 Unsteadiness on feet: Secondary | ICD-10-CM | POA: Diagnosis not present

## 2022-09-21 DIAGNOSIS — M6281 Muscle weakness (generalized): Secondary | ICD-10-CM | POA: Diagnosis not present

## 2022-09-25 DIAGNOSIS — R2681 Unsteadiness on feet: Secondary | ICD-10-CM | POA: Diagnosis not present

## 2022-09-25 DIAGNOSIS — M6281 Muscle weakness (generalized): Secondary | ICD-10-CM | POA: Diagnosis not present

## 2022-09-26 ENCOUNTER — Other Ambulatory Visit: Payer: Self-pay

## 2022-09-26 ENCOUNTER — Emergency Department (HOSPITAL_BASED_OUTPATIENT_CLINIC_OR_DEPARTMENT_OTHER)
Admission: EM | Admit: 2022-09-26 | Discharge: 2022-09-26 | Disposition: A | Discharge status: Home / Self Care | Payer: Medicare Other | Attending: Emergency Medicine | Admitting: Emergency Medicine

## 2022-09-26 ENCOUNTER — Encounter (HOSPITAL_BASED_OUTPATIENT_CLINIC_OR_DEPARTMENT_OTHER): Payer: Self-pay

## 2022-09-26 ENCOUNTER — Emergency Department (HOSPITAL_BASED_OUTPATIENT_CLINIC_OR_DEPARTMENT_OTHER): Payer: Medicare Other | Admitting: Radiology

## 2022-09-26 ENCOUNTER — Emergency Department (HOSPITAL_BASED_OUTPATIENT_CLINIC_OR_DEPARTMENT_OTHER): Payer: Medicare Other

## 2022-09-26 DIAGNOSIS — Z79899 Other long term (current) drug therapy: Secondary | ICD-10-CM | POA: Diagnosis not present

## 2022-09-26 DIAGNOSIS — M7989 Other specified soft tissue disorders: Secondary | ICD-10-CM | POA: Diagnosis not present

## 2022-09-26 DIAGNOSIS — M25561 Pain in right knee: Secondary | ICD-10-CM | POA: Insufficient documentation

## 2022-09-26 DIAGNOSIS — W19XXXA Unspecified fall, initial encounter: Secondary | ICD-10-CM | POA: Insufficient documentation

## 2022-09-26 DIAGNOSIS — Z043 Encounter for examination and observation following other accident: Secondary | ICD-10-CM | POA: Diagnosis not present

## 2022-09-26 DIAGNOSIS — M79671 Pain in right foot: Secondary | ICD-10-CM | POA: Insufficient documentation

## 2022-09-26 DIAGNOSIS — S92901A Unspecified fracture of right foot, initial encounter for closed fracture: Secondary | ICD-10-CM

## 2022-09-26 DIAGNOSIS — S8991XA Unspecified injury of right lower leg, initial encounter: Secondary | ICD-10-CM | POA: Diagnosis not present

## 2022-09-26 DIAGNOSIS — N183 Chronic kidney disease, stage 3 unspecified: Secondary | ICD-10-CM | POA: Insufficient documentation

## 2022-09-26 DIAGNOSIS — S92351A Displaced fracture of fifth metatarsal bone, right foot, initial encounter for closed fracture: Secondary | ICD-10-CM | POA: Diagnosis not present

## 2022-09-26 DIAGNOSIS — I129 Hypertensive chronic kidney disease with stage 1 through stage 4 chronic kidney disease, or unspecified chronic kidney disease: Secondary | ICD-10-CM | POA: Insufficient documentation

## 2022-09-26 MED ORDER — IBUPROFEN 400 MG PO TABS
400.0000 mg | ORAL_TABLET | Freq: Once | ORAL | Status: DC
  Filled 2022-09-26: qty 1

## 2022-09-26 MED ORDER — ACETAMINOPHEN 325 MG PO TABS
650.0000 mg | ORAL_TABLET | Freq: Once | ORAL | Status: AC
  Administered 2022-09-26: 650 mg via ORAL
  Filled 2022-09-26: qty 2

## 2022-09-26 NOTE — ED Provider Notes (Signed)
Care transferred from Jacqueline Blaze, PA-C at time of sign out. See their note for full assessment.   Briefly: Patient is 88 y.o. female who presents to the ED with mechanical fall onset 2 AM.     Plan: Plan per previous PA-C: pending ortho consult for recs.  Labs Reviewed - No data to display  Clinical Course as of 09/26/22 1730  Wed Sep 26, 2022  1545 Consult with podiatrist, Dr. Posey Pronto who recommended cam boot, nonweightbearing. [SB]  1552 Discussed with patient and family regarding recommendations as per podiatrist.  Answered all available questions. [SB]  2595 Consult to ortho, Dr. Lorin Mercy, who recommends patient being nonweightbearing.  Utilizing a wheelchair with her leg elevated.  Also follow-up in the office next week.   [SB]  82 In-depth conversation held with patient and son at bedside.  Offered to send patient home with a prescription for as needed narcotics.  Patient declines at this time and notes that the Tylenol worked perfect for her here.  Discussed discharge treatment plan.  Answered all available questions.  Patient appears safe for discharge at this time. [SB]    Clinical Course User Index [SB] Kreig Parson A, PA-C     Presentation notable for metatarsal fracture.  Patient living in facility at this time and has the ability for assistive living at her facility.  Patient returning to fill facility per son. Supportive care and strict return precautions discussed with patient. Patient acknowledges and verbalizes understanding. Pt appears safe for discharge. Follow up as indicated in discharge paperwork.    This chart was dictated using voice recognition software, Dragon. Despite the best efforts of this provider to proofread and correct errors, errors may still occur which can change documentation meaning.   Travaughn Dodson A, PA-C 09/26/22 1730    Jacqueline Shanks, MD 10/20/2022 [phone]

## 2022-09-26 NOTE — Discharge Instructions (Addendum)
It was a pleasure taking care of you today!  Your x-ray and CT showed concern today for a fracture (break in the bone) to the bones in your feet.  You may take over-the-counter 500 mg Tylenol every 6 hours as needed for pain.  Ensure that you are using a wheelchair to get around and keep your leg elevated.  Attached is information for the orthopedist, Dr. Lorin Mercy, call today and set up a follow-up appointment regarding today's ED visit for next week.  You may follow-up with your primary care provider as needed.  Return to the emergency department if you experience increasing/worsening symptoms.

## 2022-09-26 NOTE — ED Notes (Signed)
Pt was assisted to a bed pan, during the assist, the Pt was noted to have what she stated was a prolapsed bladder and hemorrhoids... Provider was able to visualize the area.Marland KitchenMarland Kitchen

## 2022-09-26 NOTE — ED Triage Notes (Signed)
Patient here POV from Home.  Endorses Falling at 0200 today when she got up to use the BR and twisted her Right Foot.   No Head Injury. No LOC. No Anticoagulants.   NAD Noted during Triage. A&Ox4. GCS 15. BIB Wheelchair.

## 2022-09-26 NOTE — ED Notes (Signed)
Discharge paperwork given and verbally understood. 

## 2022-09-26 NOTE — ED Provider Notes (Signed)
[facility] EMERGENCY DEPT Provider Note   CSN: [id number] Arrival date & time: 09/26/22  1056     History  Chief Complaint  Patient presents with   Antonae Zbikowski is a 88 y.o. female. With past medical history of hypertension, GAD, polyneuropathy, CKD 3 who presents to the emergency department with fall.   States she was getting up to use the restroom around 0200 this morning when she fell. She uses a walker at baseline and states she stepped from her walking into the bathroom and her right leg "gave out." She states she twisted her leg under her and had pain to the right knee and right foot. She was able to call someone to help get her up within 10 minutes of her fall. She landed on her buttocks and denies hitting her head or loss of consciousness. Not anticoagulated. She denies prodromal symptoms like chest pain, shortness of breath, dizziness, lightheadedness, palpitations before fall.    Fall       Home Medications Prior to Admission medications   Medication Sig Start Date End Date Taking? Authorizing Provider  acetaminophen (TYLENOL) 500 MG tablet Take 500 mg by mouth as needed.    [provider]  benzonatate (TESSALON) 200 MG capsule Take 1 capsule (200 mg total) by mouth 2 (two) times daily as needed for cough. 02/14/22   Wardell Honour, MD  calcium carbonate (TUMS - DOSED IN MG ELEMENTAL CALCIUM) 500 MG chewable tablet Chew 1 tablet by mouth as needed for indigestion or heartburn.     [provider]  cholecalciferol (VITAMIN D) 25 MCG (1000 UT) tablet Take 1,000 Units by mouth daily.    [provider]  gabapentin (NEURONTIN) 100 MG capsule Take 1 capsule (100 mg total) by mouth 2 (two) times daily. 08/16/22   Mast, Man X, NP  hydrocortisone 2.5 % cream Apply topically 2 (two) times daily as needed. 04/26/21   Mast, Man X, NP  LORazepam (ATIVAN) 0.5 MG tablet TAKE 1/2 TABLET BY MOUTH AT BEDTIME 05/03/22   Mast, Man X, NP   Multiple Vitamin (MULTIVITAMIN) tablet Take 1 tablet by mouth daily.    [provider]  Multiple Vitamins-Minerals (PRESERVISION AREDS 2 PO) Take 1 tablet by mouth daily.    [provider]  NIFEdipine (PROCARDIA XL/NIFEDICAL XL) 60 MG 24 hr tablet TAKE 1 TABLET DAILY FOR    BLOOD PRESSURE 05/21/22   Mast, Man X, NP  omeprazole (PRILOSEC OTC) 20 MG tablet Take 20 mg by mouth every other day.     [provider]  Probiotic Product (PROBIOTIC ADVANCED PO) Take by mouth.    [provider]      Allergies    Amlodipine, Enalapril, Hctz [hydrochlorothiazide], Penicillins, and Amoxicillin    Review of Systems   Review of Systems  Musculoskeletal:  Positive for arthralgias and gait problem.  All other systems reviewed and are negative.   Physical Exam Updated Vital Signs BP 136/62 (BP Location: Right Arm)   Pulse 81   Temp 98.2 F (36.8 C) (Oral)   Resp 16   Ht '5\' 5"'$  (1.651 m)   Wt 64.4 kg   SpO2 95%   BMI 23.63 kg/m  Physical Exam Vitals and nursing note reviewed.  Constitutional:      General: She is not in acute distress.    Appearance: Normal appearance. She is normal weight. She is not ill-appearing or toxic-appearing.  HENT:     Head: Normocephalic  and atraumatic.  Eyes:     General: No scleral icterus.    Extraocular Movements: Extraocular movements intact.  Pulmonary:     Effort: Pulmonary effort is normal. No respiratory distress.  Musculoskeletal:     Comments: Dorsum of right foot is grossly swollen and ecchymotic.  DP pulse 1+. Moves all toes. Able to flex and extend the right ankle. She has decreased sensation to the foot (hx of peripheral neuropathy).  Complains of pain to the medial joint line of the right knee. No obvious swelling or deformity. Able to passively range the right hip and knee with mild pain to the right knee   Pelvis is stable. No C, T, L spine TTP. No other MSK injury noted.   Skin:    General: Skin is warm  and dry.     Capillary Refill: Capillary refill takes less than 2 seconds.     Findings: Bruising present. No rash.  Neurological:     General: No focal deficit present.     Mental Status: She is alert and oriented to person, place, and time.  Psychiatric:        Mood and Affect: Mood normal.        Behavior: Behavior normal.        Thought Content: Thought content normal.        Judgment: Judgment normal.     ED Results / Procedures / Treatments   Labs (all labs ordered are listed, but only abnormal results are displayed) Labs Reviewed - No data to display  EKG None  Radiology CT Foot Right Wo Contrast  Result Date: 09/26/2022 CLINICAL DATA:  Foot pain, stress fracture suspected. Patient fell this morning. EXAM: CT OF THE RIGHT FOOT WITHOUT CONTRAST TECHNIQUE: Multidetector CT imaging of the right foot was performed according to the standard protocol. Multiplanar CT image reconstructions were also generated. RADIATION DOSE REDUCTION: This exam was performed according to the departmental dose-optimization program which includes automated exposure control, adjustment of the mA and/or kV according to patient size and/or use of iterative reconstruction technique. COMPARISON:  Radiographs same date. FINDINGS: Bones/Joint/Cartilage The bones are diffusely demineralized. There are multiple acute fractures involving the 2nd through 5th metatarsal bases, the cuboid, the lateral cuneiform and anterior process of the calcaneus. Some of these fractures demonstrate intra-articular extension, most notable the fracture involving the 4th metatarsal base. There is minimal displacement, and no subluxation at the Lisfranc joint. Ligaments Suboptimally assessed by CT. Muscles and Tendons As evaluated by CT, the ankle tendons are intact without significant tenosynovitis. No focal forefoot muscular abnormalities are identified. There is generalized muscular atrophy. Soft tissues Soft tissue swelling in the  midfoot and base of the forefoot without focal fluid collection, foreign body or soft tissue emphysema. IMPRESSION: 1. Multiple acute minimally displaced fractures about the Lisfranc joint as described. There is also a fracture of the anterior process of the calcaneus. 2. No subluxation or dislocation at the Lisfranc joint. 3. Soft tissue swelling without focal fluid collection, foreign body or soft tissue emphysema. Electronically Signed   By: Richardean Sale M.D.   On: 09/26/2022 15:00   DG Knee Complete 4 Views Right  Result Date: 09/26/2022 CLINICAL DATA:  Twisting injury.  Pain. EXAM: RIGHT KNEE - COMPLETE 4+ VIEW COMPARISON:  None Available. FINDINGS: No evidence for an acute fracture. No subluxation or dislocation. Meniscal calcification evident bilaterally. Joint effusion evident. No worrisome lytic or sclerotic osseous abnormality. Mild degenerative spurring noted patellofemoral compartment. IMPRESSION: 1. No acute  bony findings. 2. Joint effusion. Electronically Signed   By: Misty Stanley M.D.   On: 09/26/2022 13:53   DG Foot Complete Right  Result Date: 09/26/2022 CLINICAL DATA:  Fall EXAM: RIGHT FOOT COMPLETE - 3 VIEW; RIGHT ANKLE - COMPLETE 3 VIEW COMPARISON:  None Available. FINDINGS: There is no evidence of fracture or dislocation. There is no evidence of arthropathy or other focal bone abnormality. Calcification of the Achilles consistent with chronic tendinitis. IMPRESSION: No acute osseous abnormalities. Electronically Signed   By: Sammie Bench M.D.   On: 09/26/2022 12:54   DG Ankle Complete Right  Result Date: 09/26/2022 CLINICAL DATA:  Fall EXAM: RIGHT FOOT COMPLETE - 3 VIEW; RIGHT ANKLE - COMPLETE 3 VIEW COMPARISON:  None Available. FINDINGS: There is no evidence of fracture or dislocation. There is no evidence of arthropathy or other focal bone abnormality. Calcification of the Achilles consistent with chronic tendinitis. IMPRESSION: No acute osseous abnormalities.  Electronically Signed   By: Sammie Bench M.D.   On: 09/26/2022 12:54    Procedures Procedures   Medications Ordered in ED Medications  ibuprofen (ADVIL) tablet 400 mg (400 mg Oral Patient Refused/Not Given 09/26/22 1405)  acetaminophen (TYLENOL) tablet 650 mg (650 mg Oral Given 09/26/22 1358)    ED Course/ Medical Decision Making/ A&P                           Medical Decision Making Amount and/or Complexity of Data Reviewed Radiology: ordered.  Risk OTC drugs. Prescription drug management.   Initial Impression and Ddx 88 year old female who presents to the emergency department with mechanical fall last night around 2 AM.  No prolonged downtime on the ground.  Complaining of pain to the right foot and knee.  No head trauma.  Not anticoagulated.  Ordering film of the right foot and ankle as well as the right knee. Patient PMH that increases complexity of ED encounter: Hypertension, polyneuropathy, CKD stage III Differential: Fracture, subluxation, ligamentous or tendinous injury  Interpretation of Diagnostics I independent reviewed and interpreted the labs as followed: Not indicated  - I independently visualized the following imaging with scope of interpretation limited to determining acute life threatening conditions related to emergency care: Plain film of the right foot and ankle, which revealed soft tissue swelling without fracture or subluxation, plain film of the right knee with joint effusion and no bony abnormalities.  CT of the right foot  Patient Reassessment and Ultimate Disposition/Management After assessment, she has not been weightbearing since the event early this morning.  She is having pain with weight down on the foot.  The plain films are negative.  I had Dr. Truett Mainland, ED attending evaluate the patient's right foot.  He feels appropriate to proceed with CT right foot to evaluate for occult Lisfranc fracture versus other.  This has been ordered.  The patient and  family are agreeable to this. Dosing with Tylenol  On reassessment she has improvement of pain after Tylenol.  Her CT does show multiple fractures in the foot.  Consulting orthopedics.  I have updated the patient and son on the results of the CT exam.  Care of patient is being handed off to [employer], PA-C at this time. Pending orthopedic consult and recommendations. Will likely need splinting/boot placement and NWB status. They have assisted living at facility she resides in currently. Pending dispo once consult is completed.  Final Clinical Impression(s) / ED Diagnoses Final diagnoses:  None  Rx / DC Orders ED Discharge Orders     None         Mickie Hillier, PA-C 09/26/22 1542    Cristie Hem, MD 09/27/22 [phone]

## 2022-09-28 ENCOUNTER — Telehealth: Payer: Self-pay

## 2022-09-28 DIAGNOSIS — R2681 Unsteadiness on feet: Secondary | ICD-10-CM | POA: Diagnosis not present

## 2022-09-28 DIAGNOSIS — M6281 Muscle weakness (generalized): Secondary | ICD-10-CM | POA: Diagnosis not present

## 2022-09-28 NOTE — Telephone Encounter (Signed)
Transition Care Management Follow-up Telephone Call Date of discharge and from where: 09/26/2022, [facility] How have you been since you were released from the hospital? Better pain is more managable Any questions or concerns? No  Items Reviewed: Did the pt receive and understand the discharge instructions provided? Yes  Medications obtained and verified? Yes  Other? No  Any new allergies since your discharge? No  Dietary orders reviewed? No Do you have support at home? Yes   Home Care and Equipment/Supplies: Were home health services ordered? no If so, what is the name of the agency? N/A  Has the agency set up a time to come to the patient's home? no Were any new equipment or medical supplies ordered?  NO What is the name of the medical supply agency? N/A Were you able to get the supplies/equipment? yes Do you have any questions related to the use of the equipment or supplies? No  Functional Questionnaire: (I = Independent and D = Dependent) ADLs: d  Bathing/Dressing- D  Meal Prep- D  Eating- I  Maintaining continence- I  Transferring/Ambulation- D  Managing Meds- I  Follow up appointments reviewed:  PCP Hospital f/u appt confirmed? Yes  Scheduled to see Jacqueline Bretta Bang, NP on 10/11/2021 @ 300. [facility] f/u appt confirmed? Yes  Scheduled to see ortho- surgeon on 10/04/2022 @ 1000am. Are transportation arrangements needed? No  If their condition worsens, is the pt aware to call PCP or go to the Emergency Dept.? Yes Was the patient provided with contact information for the PCP's office or ED? Yes Was to pt encouraged to call back with questions or concerns? Yes

## 2022-10-02 ENCOUNTER — Non-Acute Institutional Stay (SKILLED_NURSING_FACILITY): Payer: Medicare Other | Admitting: Family Medicine

## 2022-10-02 DIAGNOSIS — R269 Unspecified abnormalities of gait and mobility: Secondary | ICD-10-CM

## 2022-10-02 DIAGNOSIS — G63 Polyneuropathy in diseases classified elsewhere: Secondary | ICD-10-CM | POA: Diagnosis not present

## 2022-10-02 DIAGNOSIS — H353124 Nonexudative age-related macular degeneration, left eye, advanced atrophic with subfoveal involvement: Secondary | ICD-10-CM | POA: Diagnosis not present

## 2022-10-02 DIAGNOSIS — N184 Chronic kidney disease, stage 4 (severe): Secondary | ICD-10-CM

## 2022-10-02 DIAGNOSIS — N183 Chronic kidney disease, stage 3 unspecified: Secondary | ICD-10-CM

## 2022-10-02 DIAGNOSIS — F411 Generalized anxiety disorder: Secondary | ICD-10-CM

## 2022-10-02 DIAGNOSIS — I130 Hypertensive heart and chronic kidney disease with heart failure and stage 1 through stage 4 chronic kidney disease, or unspecified chronic kidney disease: Secondary | ICD-10-CM

## 2022-10-02 DIAGNOSIS — I10 Essential (primary) hypertension: Secondary | ICD-10-CM | POA: Diagnosis not present

## 2022-10-02 NOTE — Progress Notes (Signed)
Provider:  Alain Honey, MD Location:      Place of Service:     PCP: Mast, Man X, NP Patient Care Team: Mast, Man X, NP as PCP - General (Internal Medicine) Lavonna Monarch, MD (Inactive) as Consulting Physician (Dermatology) Clent Jacks, MD as Consulting Physician (Ophthalmology) Elmarie Shiley, MD as Consulting Physician (Nephrology) Zadie Rhine Clent Demark, MD as Consulting Physician (Ophthalmology) [place] Mast, Man X, NP as Nurse Practitioner (Internal Medicine)  Extended Emergency Contact Information Primary Emergency Contact: Narine,James Address: [place] [address] Johnnette Litter of [place] Phone: [phone] Mobile Phone: [phone] Relation: Son Secondary Emergency Contact: Kerry Kass [place] of [place] Phone: [phone] Work Phone: [phone] Mobile Phone: [phone] Relation: Daughter  Code Status:  Goals of Care: Advanced Directive information    09/26/2022   11:17 AM  Advanced Directives  Does Patient Have a Medical Advance Directive? No  Would patient like information on creating a medical advance directive? No - Patient declined      No chief complaint on file.   HPI: Patient is a 88 y.o. female seen today for admission to [facility].  Patient suffered a fall in the bathroom around 2 AM on 1220.  In going to the bathroom she states that she twisted her leg under her and right knee seem to give way.  She was able to call someone to help her up within 10 minutes of the fall.  Denies loss of consciousness.  There were no associated symptoms such as lightheadedness dizziness or chest pain. Past medical history was significant for hypertension polyneuropathy chronic kidney disease, and generalized anxiety disorder X-rays showed multiple metatarsal fractures.  Orthopedics was consulted and recommended patient being nonweightbearing with use of the wheelchair with elevation of her leg.  She  was initially sent back to her independent living apartment but it is felt that she is needed physical and Occupational Therapy.  Past Medical History:  Diagnosis Date   Abnormality of gait    Acute, but ill-defined, cerebrovascular disease    Anxiety    Arthritis    Chronic kidney disease, stage II (mild)    Cystocele, midline    Dermatophytosis of the body    Disturbance of skin sensation    Dizziness and giddiness    Duodenal ulcer, unspecified as acute or chronic, without hemorrhage, perforation, or obstruction    Edema    Elevated blood pressure reading without diagnosis of hypertension    GERD (gastroesophageal reflux disease)    Hemorrhoids    Hypertension    Left bundle branch hemiblock    Lumbago    Macular degeneration (senile) of retina, unspecified    Myalgia and myositis, unspecified    Orthostatic hypotension    Other abnormal blood chemistry    Other malaise and fatigue    Personal history of fall    Polyneuropathy in other diseases classified elsewhere ([place]) 02/16/2014   Postmenopausal atrophic vaginitis    Primary cerebellar degeneration (HCC)    Rectocele    Restless legs syndrome (RLS)    Unspecified constipation    Unspecified hereditary and idiopathic peripheral neuropathy    Urgency of urination    Past Surgical History:  Procedure Laterality Date   ABDOMINAL HYSTERECTOMY  1972   APPENDECTOMY  1945   CATARACT EXTRACTION Bilateral    EXTERNAL EAR SURGERY  2010   left outer ear basel cell removed   TONSILLECTOMY  1940    reports that she has never smoked. She has never used smokeless tobacco. She reports that she does not drink alcohol and does not use drugs. Social History   Socioeconomic History   Marital status: Widowed    Spouse name: Not on file   Number of children: 5   Years of education: college   Highest education level: Not on file  Occupational History   Occupation: Retired    Comment: Control and instrumentation engineer  Tobacco Use   Smoking  status: Never   Smokeless tobacco: Never  Vaping Use   Vaping Use: Never used  Substance and Sexual Activity   Alcohol use: No    Alcohol/week: 0.0 standard drinks of alcohol   Drug use: No   Sexual activity: Never  Other Topics Concern   Not on file  Social History Narrative   Patient is widowed, with 5 children.   Patient is right handed.   Patient has college education.   Patient does not drink caffeine.   Social Determinants of Health   Financial Resource Strain: Low Risk  (09/26/2017)   Overall Financial Resource Strain (CARDIA)    Difficulty of Paying Living Expenses: Not hard at all  Food Insecurity: No Food Insecurity (09/26/2017)   Hunger Vital Sign    Worried About Running Out of Food in the Last Year: Never true    Calcium in the Last Year: Never true  Transportation Needs: Unknown (09/26/2017)   PRAPARE - Transportation    Lack of Transportation (Medical): Patient refused    Lack of Transportation (Non-Medical): Patient refused  Physical Activity: Insufficiently Active (09/26/2017)   Exercise Vital Sign    Days of Exercise per Week: 3 days    Minutes of Exercise per Session: 30 min  Stress: No Stress Concern Present (09/26/2017)   [place]    Feeling of Stress : Not at all  Social Connections: Moderately Integrated (09/26/2017)   Social Connection and Isolation Panel [NHANES]    Frequency of Communication with Friends and Family: More than three times a week    Frequency of Social Gatherings with Friends and Family: More than three times a week    Attends Religious Services: More than 4 times per year    Active Member of [employer] or Organizations: Yes    Attends Archivist Meetings: More than 4 times per year    Marital Status: Widowed  Intimate Partner Violence: Not At Risk (09/26/2017)   Humiliation, Afraid, Rape, and Kick questionnaire    Fear of Current or Ex-Partner: No     Emotionally Abused: No    Physically Abused: No    Sexually Abused: No    Functional Status Survey:    Family History  Problem Relation Age of Onset   Aortic stenosis Mother    Heart failure Mother    Thrombosis Father    Coronary artery disease Father    Ataxia Sister    Ataxia Brother    Heart disease Brother    Cancer Brother        Prostate   Stroke Brother     Health Maintenance  Topic Date Due   Zoster Vaccines- Shingrix (1 of 2) Never done   INFLUENZA VACCINE  05/08/2022   COVID-19 Vaccine (5 - 2023-24 season) 06/08/2022   Medicare Annual Wellness (AWV)  11/23/2022   DTaP/Tdap/Td (3 - Td or Tdap) 09/04/2027   Pneumonia Vaccine 82+ Years old  Completed   HPV VACCINES  Aged Out   DEXA SCAN  Discontinued    Allergies  Allergen Reactions   Amlodipine Other (See Comments)    Reaction:  Critically decreased sodium and potassium levels.     Enalapril Other (See Comments)    Reaction:  Unknown    Hctz [Hydrochlorothiazide] Other (See Comments)    Reaction:  Unknown    Penicillins Other (See Comments)    Reaction:  Unknown    Amoxicillin Swelling and Rash    Outpatient Encounter Medications as of 10/02/2022  Medication Sig   acetaminophen (TYLENOL) 500 MG tablet Take 500 mg by mouth as needed.   benzonatate (TESSALON) 200 MG capsule Take 1 capsule (200 mg total) by mouth 2 (two) times daily as needed for cough.   calcium carbonate (TUMS - DOSED IN MG ELEMENTAL CALCIUM) 500 MG chewable tablet Chew 1 tablet by mouth as needed for indigestion or heartburn.    cholecalciferol (VITAMIN D) 25 MCG (1000 UT) tablet Take 1,000 Units by mouth daily.   gabapentin (NEURONTIN) 100 MG capsule Take 1 capsule (100 mg total) by mouth 2 (two) times daily.   hydrocortisone 2.5 % cream Apply topically 2 (two) times daily as needed.   LORazepam (ATIVAN) 0.5 MG tablet TAKE 1/2 TABLET BY MOUTH AT BEDTIME   Multiple Vitamin (MULTIVITAMIN) tablet Take 1 tablet by mouth daily.    Multiple Vitamins-Minerals (PRESERVISION AREDS 2 PO) Take 1 tablet by mouth daily.   NIFEdipine (PROCARDIA XL/NIFEDICAL XL) 60 MG 24 hr tablet TAKE 1 TABLET DAILY FOR    BLOOD PRESSURE   omeprazole (PRILOSEC OTC) 20 MG tablet Take 20 mg by mouth every other day.    Probiotic Product (PROBIOTIC ADVANCED PO) Take by mouth.   No facility-administered encounter medications on file as of 10/02/2022.    Review of Systems  Constitutional: Negative.   HENT: Negative.    Respiratory: Negative.    Cardiovascular: Negative.   Endocrine: Negative.   Musculoskeletal:  Positive for gait problem.  Psychiatric/Behavioral: Negative.    All other systems reviewed and are negative.   There were no vitals filed for this visit. There is no height or weight on file to calculate BMI. Physical Exam Vitals and nursing note reviewed.  Constitutional:      Appearance: Normal appearance.  Cardiovascular:     Rate and Rhythm: Normal rate and regular rhythm.  Pulmonary:     Effort: Pulmonary effort is normal.     Breath sounds: Normal breath sounds.  Musculoskeletal:     Cervical back: Normal range of motion.     Comments: Right foot ankle in a boot  Skin:    General: Skin is warm and dry.  Neurological:     General: No focal deficit present.     Mental Status: She is alert and oriented to person, place, and time.  Psychiatric:        Mood and Affect: Mood normal.        Behavior: Behavior normal.     Labs reviewed: Basic Metabolic Panel: No results for input(s): "NA", "K", "CL", "CO2", "GLUCOSE", "BUN", "CREATININE", "CALCIUM", "MG", "PHOS" in the last 8760 hours. Liver Function Tests: No results for input(s): "AST", "ALT", "ALKPHOS", "BILITOT", "PROT", "ALBUMIN" in the last 8760 hours. No results for input(s): "LIPASE", "AMYLASE" in the last 8760 hours. No results for input(s): "AMMONIA" in the last 8760 hours. CBC: No results for input(s): "WBC", "NEUTROABS", "HGB", "HCT", "MCV", "PLT" in  the last 8760 hours.  Cardiac Enzymes: No results for input(s): "CKTOTAL", "CKMB", "CKMBINDEX", "TROPONINI" in the last 8760 hours. BNP: Invalid input(s): "POCBNP" Lab Results  Component Value Date   HGBA1C 5.5 08/18/2019   Lab Results  Component Value Date   TSH 3.83 10/07/2018   Lab Results  Component Value Date   [id number] 694 09/21/2019   No results found for: "FOLATE" Lab Results  Component Value Date   IRON 58 09/21/2019   TIBC 295 09/21/2019   FERRITIN 69 09/21/2019    Imaging and Procedures obtained prior to SNF admission: CT Foot Right Wo Contrast  Result Date: 09/26/2022 CLINICAL DATA:  Foot pain, stress fracture suspected. Patient fell this morning. EXAM: CT OF THE RIGHT FOOT WITHOUT CONTRAST TECHNIQUE: Multidetector CT imaging of the right foot was performed according to the standard protocol. Multiplanar CT image reconstructions were also generated. RADIATION DOSE REDUCTION: This exam was performed according to the departmental dose-optimization program which includes automated exposure control, adjustment of the mA and/or kV according to patient size and/or use of iterative reconstruction technique. COMPARISON:  Radiographs same date. FINDINGS: Bones/Joint/Cartilage The bones are diffusely demineralized. There are multiple acute fractures involving the 2nd through 5th metatarsal bases, the cuboid, the lateral cuneiform and anterior process of the calcaneus. Some of these fractures demonstrate intra-articular extension, most notable the fracture involving the 4th metatarsal base. There is minimal displacement, and no subluxation at the Lisfranc joint. Ligaments Suboptimally assessed by CT. Muscles and Tendons As evaluated by CT, the ankle tendons are intact without significant tenosynovitis. No focal forefoot muscular abnormalities are identified. There is generalized muscular atrophy. Soft tissues Soft tissue swelling in the midfoot and base of the forefoot without focal  fluid collection, foreign body or soft tissue emphysema. IMPRESSION: 1. Multiple acute minimally displaced fractures about the Lisfranc joint as described. There is also a fracture of the anterior process of the calcaneus. 2. No subluxation or dislocation at the Lisfranc joint. 3. Soft tissue swelling without focal fluid collection, foreign body or soft tissue emphysema. Electronically Signed   By: Richardean Sale M.D.   On: 09/26/2022 15:00   DG Knee Complete 4 Views Right  Result Date: 09/26/2022 CLINICAL DATA:  Twisting injury.  Pain. EXAM: RIGHT KNEE - COMPLETE 4+ VIEW COMPARISON:  None Available. FINDINGS: No evidence for an acute fracture. No subluxation or dislocation. Meniscal calcification evident bilaterally. Joint effusion evident. No worrisome lytic or sclerotic osseous abnormality. Mild degenerative spurring noted patellofemoral compartment. IMPRESSION: 1. No acute bony findings. 2. Joint effusion. Electronically Signed   By: Misty Stanley M.D.   On: 09/26/2022 13:53   DG Foot Complete Right  Result Date: 09/26/2022 CLINICAL DATA:  Fall EXAM: RIGHT FOOT COMPLETE - 3 VIEW; RIGHT ANKLE - COMPLETE 3 VIEW COMPARISON:  None Available. FINDINGS: There is no evidence of fracture or dislocation. There is no evidence of arthropathy or other focal bone abnormality. Calcification of the Achilles consistent with chronic tendinitis. IMPRESSION: No acute osseous abnormalities. Electronically Signed   By: Sammie Bench M.D.   On: 09/26/2022 12:54   DG Ankle Complete Right  Result Date: 09/26/2022 CLINICAL DATA:  Fall EXAM: RIGHT FOOT COMPLETE - 3 VIEW; RIGHT ANKLE - COMPLETE 3 VIEW COMPARISON:  None Available. FINDINGS: There is no evidence of fracture or dislocation. There is no evidence of arthropathy or other focal bone abnormality. Calcification of the Achilles consistent with chronic tendinitis. IMPRESSION: No acute osseous abnormalities. Electronically Signed   By: Sammie Bench M.D.   On:  09/26/2022  12:54    Assessment/Plan 1. Abnormality of gait Patient normally uses a walker for ambulation but is nonweightbearing since her fractured metatarsals  2. Advanced nonexudative age-related macular degeneration of left eye with subfoveal involvement Patient on PreserVision  3. Benign hypertensive heart and CKD, stage 3 (GFR 30-59), w CHF ([place]) Followed by nephrology.  Takes nifedipine 60 mg once a day  4. CKD (chronic kidney disease) stage 4, GFR 15-29 ml/min (HCC) Labs on renal function are not current.  She is followed by nephrology but last GFR was 26 3 years ago  5. Generalized anxiety disorder Uses lorazepam 0.5 mg  6. Primary hypertension Would like to switch her from dihydropyridine calcium channel blocker to amlodipine 5 mg and follow blood pressure  7. Polyneuropathy associated with underlying disease ([place]) Patient only takes gabapentin 100 mg twice daily.  Symptoms are not well-controlled will gradually increase dosage from 200 twice daily to possibly 300 depending on symptoms.  Not sure if her symptoms are related to restless leg or other.  Gabapentin should help if it is restless leg or neuropathy    Family/ staff Communication:   Labs/tests ordered:  Lillette Boxer. [place], [place] [address] [place], [place] Office [phone]

## 2022-10-03 ENCOUNTER — Other Ambulatory Visit: Payer: Self-pay

## 2022-10-03 ENCOUNTER — Other Ambulatory Visit: Payer: Self-pay | Admitting: Adult Health

## 2022-10-03 DIAGNOSIS — S92301D Fracture of unspecified metatarsal bone(s), right foot, subsequent encounter for fracture with routine healing: Secondary | ICD-10-CM | POA: Diagnosis not present

## 2022-10-03 DIAGNOSIS — F411 Generalized anxiety disorder: Secondary | ICD-10-CM

## 2022-10-03 MED ORDER — LORAZEPAM 0.5 MG PO TABS: 0.2500 mg | ORAL_TABLET | Freq: Every day | ORAL | 0 refills | Status: AC

## 2022-10-03 NOTE — Telephone Encounter (Signed)
Refill request received from pharmacy.  Medication pended and sent to Dr. Alain Honey

## 2022-10-04 ENCOUNTER — Ambulatory Visit (INDEPENDENT_AMBULATORY_CARE_PROVIDER_SITE_OTHER): Payer: Medicare Other

## 2022-10-04 ENCOUNTER — Encounter: Payer: Self-pay | Admitting: Physician Assistant

## 2022-10-04 ENCOUNTER — Ambulatory Visit (INDEPENDENT_AMBULATORY_CARE_PROVIDER_SITE_OTHER): Payer: Medicare Other | Admitting: Physician Assistant

## 2022-10-04 DIAGNOSIS — S3992XA Unspecified injury of lower back, initial encounter: Secondary | ICD-10-CM

## 2022-10-04 MED ORDER — LORAZEPAM 0.5 MG PO TABS: 0.2500 mg | ORAL_TABLET | Freq: Every day | ORAL | 5 refills | Status: DC

## 2022-10-04 NOTE — Progress Notes (Signed)
Office Visit Note   Patient: Jacqueline Dodson           Date of Birth: 26-Mar-1927           MRN: [id number] Visit Date: 10/04/2022              Requested by: Mast, Man X, NP [address],  [place] [address] PCP: Mast, Man X, NP  Chief Complaint  Patient presents with   Right Foot - Fracture   Lower Back - Pain      HPI: [name] is a pleasant 88 year old woman who is accompanied by her son.  She is approximately 8 days status post falling at her assisted living residence.  She states that her right knee gave out on her.  She says this has been happening for few weeks.  She landed onto her back and on the front of her knee she recalls.  She also injured her foot and was seen at an urgent care.  CT scan was performed demonstrating multiple metatarsal base fractures of second through the fifth.  No disruption or dislocation of the foot is noted.  She has been in the short cam boot.  She has remained nonweightbearing and is now in skilled nursing for recovery.  She also has complaints of cramping that goes up and down her legs has been going on for what she says is years "  Assessment & Plan: Visit Diagnoses:  1. Injury of coccyx, initial encounter     Plan: With regards to her foot would like to treat this conservatively but will take new Dodson-rays in 1 week.  She should remain in the boot and minimize her weightbearing.  Could use the boot for transfers only.  With regards to low back I think I do not see any acute fractures however she probably has ongoing spinal stenosis.  Would like to get a dedicated lumbar spine film next week.  Could consider getting an MRI and injections if the stenosis was the cause of her ongoing back issues.  Her general practitioner is managing her pain with gabapentin and is in the process of increasing the doses and alternate with Tylenol.  Follow-Up Instructions: Return in about 1 week (around 10/11/2022).   Ortho Exam  Patient is alert, oriented, no  adenopathy, well-dressed, normal affect, normal respiratory effort. Right foot she has warm foot palpable pulse or mild resolving ecchymosis with little swelling.  Compartments are soft and nontender she does have a small contusion is resolving over anterior knee.  Patella tendon and quadricep tendon are intact.  No tenderness over the lower spine or into the buttocks.  She does have baseline neuropathy.  She is able to bend her ankle up and down.  She does have diffuse tenderness through the midfoot.  Imaging: XR Sacrum/Coccyx  Result Date: 10/04/2022 Views of her coccyx were taken today.  She does have significant degenerative changes in the lower spine and into the coccyx and sacrum.  View is somewhat obliterated by stool in the bowel.  Cannot appreciate any acute fractures  No images are attached to the encounter.  Labs: Lab Results  Component Value Date   HGBA1C 5.5 08/18/2019   HGBA1C 5.9 (H) 10/07/2018   LABORGA NO GROWTH 02/11/2017     Lab Results  Component Value Date   ALBUMIN 4.0 11/27/2018   ALBUMIN 3.9 02/11/2017   ALBUMIN 4.2 08/28/2016    Lab Results  Component Value Date   MG 2.1 11/27/2018  MG 2.2 10/10/2017   MG 2.1 07/24/2010   Lab Results  Component Value Date   VD25OH 52 10/10/2017    No results found for: "PREALBUMIN"    Latest Ref Rng & Units 09/21/2019   12:25 PM 08/24/2019    2:25 PM 08/18/2019    7:05 AM  CBC EXTENDED  WBC 3.8 - 10.8 Thousand/uL 4.3  9.9  6.2   RBC 3.80 - 5.10 Million/uL 3.61  3.59  3.53   Hemoglobin 11.7 - 15.5 g/dL 10.7  10.7  10.7   HCT 35.0 - 45.0 % 32.3  32.5  31.6   Platelets 140 - 400 Thousand/uL 147  232  177   NEUT# 1,500 - 7,800 cells/uL 1,535   3,522   Lymph# 850 - 3,900 cells/uL 1,974   1,823      There is no height or weight on file to calculate BMI.  Orders:  Orders Placed This Encounter  Procedures   XR Sacrum/Coccyx   No orders of the defined types were placed in this encounter.     Procedures: No procedures performed  Clinical Data: No additional findings.  ROS:  All other systems negative, except as noted in the HPI. Review of Systems  Objective: Vital Signs: There were no vitals taken for this visit.  Specialty Comments:  No specialty comments available.  PMFS History: Patient Active Problem List   Diagnosis Date Noted   Cough 02/14/2022   Hyperlipidemia 02/11/2020   Exudative age-related macular degeneration of right eye with inactive choroidal neovascularization ([place]) 01/21/2020   Advanced nonexudative age-related macular degeneration of right eye without subfoveal involvement 01/21/2020   Exudative age-related macular degeneration of left eye with inactive choroidal neovascularization ([place]) 01/21/2020   Advanced nonexudative age-related macular degeneration of left eye with subfoveal involvement 01/21/2020   Anemia, chronic disease 10/15/2019   Pneumonia 09/24/2019   Diarrhea 12/25/2018   Vitamin D deficiency 12/01/2018   Benign hypertensive heart and CKD, stage 3 (GFR 30-59), w CHF ([place]) 08/27/2017   CKD (chronic kidney disease) stage 4, GFR 15-29 ml/min ([place]) 05/31/2016   Incontinent of urine 12/29/2015   Bowel incontinence 12/29/2015   Lightheadedness 03/27/2015   Dysphagia 12/16/2014   GERD (gastroesophageal reflux disease) 09/09/2014   Edema 09/09/2014   Polyneuropathy in other diseases classified elsewhere ([place]) 02/16/2014   Abnormality of gait 02/16/2014   Restless legs syndrome (RLS) 02/16/2014   Constipation 12/20/2013   Peripheral neuropathy 12/20/2013   Rectal mucosa prolapse 04/13/2013   Osteoarthritis of right knee 01/15/2013   Osteoarthritis of right hip 01/15/2013   HTN (hypertension) 01/15/2013   Insomnia 01/15/2013   Generalized anxiety disorder 01/15/2013   Pelvic organ prolapse quantification stage 3 cystocele 01/15/2013   Past Medical History:  Diagnosis Date   Abnormality of gait    Acute, but ill-defined,  cerebrovascular disease    Anxiety    Arthritis    Chronic kidney disease, stage II (mild)    Cystocele, midline    Dermatophytosis of the body    Disturbance of skin sensation    Dizziness and giddiness    Duodenal ulcer, unspecified as acute or chronic, without hemorrhage, perforation, or obstruction    Edema    Elevated blood pressure reading without diagnosis of hypertension    GERD (gastroesophageal reflux disease)    Hemorrhoids    Hypertension    Left bundle branch hemiblock    Lumbago    Macular degeneration (senile) of retina, unspecified    Myalgia and myositis, unspecified  Orthostatic hypotension    Other abnormal blood chemistry    Other malaise and fatigue    Personal history of fall    Polyneuropathy in other diseases classified elsewhere ([place]) 02/16/2014   Postmenopausal atrophic vaginitis    Primary cerebellar degeneration (HCC)    Rectocele    Restless legs syndrome (RLS)    Unspecified constipation    Unspecified hereditary and idiopathic peripheral neuropathy    Urgency of urination     Family History  Problem Relation Age of Onset   Aortic stenosis Mother    Heart failure Mother    Thrombosis Father    Coronary artery disease Father    Ataxia Sister    Ataxia Brother    Heart disease Brother    Cancer Brother        Prostate   Stroke Brother     Past Surgical History:  Procedure Laterality Date   ABDOMINAL HYSTERECTOMY  1972   APPENDECTOMY  1945   CATARACT EXTRACTION Bilateral    EXTERNAL EAR SURGERY  2010   left outer ear basel cell removed   TONSILLECTOMY  1940   Social History   Occupational History   Occupation: Retired    Comment: Control and instrumentation engineer  Tobacco Use   Smoking status: Never   Smokeless tobacco: Never  Vaping Use   Vaping Use: Never used  Substance and Sexual Activity   Alcohol use: No    Alcohol/week: 0.0 standard drinks of alcohol   Drug use: No   Sexual activity: Never

## 2022-10-05 ENCOUNTER — Encounter: Payer: Self-pay | Admitting: Nurse Practitioner

## 2022-10-05 DIAGNOSIS — S92301D Fracture of unspecified metatarsal bone(s), right foot, subsequent encounter for fracture with routine healing: Secondary | ICD-10-CM | POA: Diagnosis not present

## 2022-10-05 DIAGNOSIS — S92309A Fracture of unspecified metatarsal bone(s), unspecified foot, initial encounter for closed fracture: Secondary | ICD-10-CM | POA: Insufficient documentation

## 2022-10-08 ENCOUNTER — Telehealth: Payer: Self-pay | Admitting: Orthopedic Surgery

## 2022-10-08 DIAGNOSIS — J90 Pleural effusion, not elsewhere classified: Secondary | ICD-10-CM | POA: Diagnosis not present

## 2022-10-08 DIAGNOSIS — J189 Pneumonia, unspecified organism: Secondary | ICD-10-CM

## 2022-10-08 DIAGNOSIS — R918 Other nonspecific abnormal finding of lung field: Secondary | ICD-10-CM | POA: Diagnosis not present

## 2022-10-08 MED ORDER — DOXYCYCLINE HYCLATE 100 MG PO TABS: 100.0000 mg | ORAL_TABLET | Freq: Two times a day (BID) | ORAL | 0 refills | Status: AC

## 2022-10-08 NOTE — Telephone Encounter (Signed)
[facility] nurse reports productive cough this morning. O2 sats 88% on room air. Nursing started 2 liters oxygen and sats > 95%. She also feels fatigued. Afebrile. She does have a h/o cough and uses tessalon pearls prn. Rapid flu and covid ordered. Recommend chest xray if rapid tests negative.

## 2022-10-08 NOTE — Telephone Encounter (Signed)
[facility] nurse reports left lower infiltrate. Rapid flu and covid negative. Doxycycline 100 mg po BID x 7 days started.Nursing removed oxygen and sats fell to 86%. Advised nurse to restart oxygen and to keep sats > 90 %. Temp also 99.0. Advised to give house order of tylenol 650 mg.

## 2022-10-09 ENCOUNTER — Inpatient Hospital Stay (HOSPITAL_COMMUNITY)
Admission: EM | Admit: 2022-10-09 | Discharge: 2022-11-08 | DRG: 871 | Disposition: E | Payer: Medicare Other | Attending: Family Medicine | Admitting: Family Medicine

## 2022-10-09 ENCOUNTER — Encounter (HOSPITAL_COMMUNITY): Payer: Self-pay | Admitting: Internal Medicine

## 2022-10-09 ENCOUNTER — Emergency Department (HOSPITAL_COMMUNITY): Payer: Medicare Other

## 2022-10-09 ENCOUNTER — Other Ambulatory Visit: Payer: Self-pay

## 2022-10-09 DIAGNOSIS — Z66 Do not resuscitate: Secondary | ICD-10-CM | POA: Diagnosis present

## 2022-10-09 DIAGNOSIS — Z823 Family history of stroke: Secondary | ICD-10-CM | POA: Diagnosis not present

## 2022-10-09 DIAGNOSIS — Z515 Encounter for palliative care: Secondary | ICD-10-CM

## 2022-10-09 DIAGNOSIS — H3530 Unspecified macular degeneration: Secondary | ICD-10-CM | POA: Diagnosis present

## 2022-10-09 DIAGNOSIS — Z1152 Encounter for screening for COVID-19: Secondary | ICD-10-CM

## 2022-10-09 DIAGNOSIS — R269 Unspecified abnormalities of gait and mobility: Secondary | ICD-10-CM | POA: Diagnosis present

## 2022-10-09 DIAGNOSIS — J90 Pleural effusion, not elsewhere classified: Secondary | ICD-10-CM | POA: Diagnosis not present

## 2022-10-09 DIAGNOSIS — I129 Hypertensive chronic kidney disease with stage 1 through stage 4 chronic kidney disease, or unspecified chronic kidney disease: Secondary | ICD-10-CM | POA: Diagnosis present

## 2022-10-09 DIAGNOSIS — Z79899 Other long term (current) drug therapy: Secondary | ICD-10-CM

## 2022-10-09 DIAGNOSIS — E869 Volume depletion, unspecified: Secondary | ICD-10-CM | POA: Diagnosis not present

## 2022-10-09 DIAGNOSIS — G319 Degenerative disease of nervous system, unspecified: Secondary | ICD-10-CM | POA: Diagnosis present

## 2022-10-09 DIAGNOSIS — F419 Anxiety disorder, unspecified: Secondary | ICD-10-CM | POA: Diagnosis present

## 2022-10-09 DIAGNOSIS — K649 Unspecified hemorrhoids: Secondary | ICD-10-CM | POA: Diagnosis present

## 2022-10-09 DIAGNOSIS — M545 Low back pain, unspecified: Secondary | ICD-10-CM | POA: Diagnosis present

## 2022-10-09 DIAGNOSIS — G2581 Restless legs syndrome: Secondary | ICD-10-CM | POA: Diagnosis not present

## 2022-10-09 DIAGNOSIS — K219 Gastro-esophageal reflux disease without esophagitis: Secondary | ICD-10-CM | POA: Diagnosis present

## 2022-10-09 DIAGNOSIS — I951 Orthostatic hypotension: Secondary | ICD-10-CM | POA: Diagnosis present

## 2022-10-09 DIAGNOSIS — L89892 Pressure ulcer of other site, stage 2: Secondary | ICD-10-CM | POA: Diagnosis present

## 2022-10-09 DIAGNOSIS — Z9181 History of falling: Secondary | ICD-10-CM

## 2022-10-09 DIAGNOSIS — J189 Pneumonia, unspecified organism: Principal | ICD-10-CM

## 2022-10-09 DIAGNOSIS — S92301A Fracture of unspecified metatarsal bone(s), right foot, initial encounter for closed fracture: Secondary | ICD-10-CM

## 2022-10-09 DIAGNOSIS — N179 Acute kidney failure, unspecified: Secondary | ICD-10-CM | POA: Diagnosis present

## 2022-10-09 DIAGNOSIS — E785 Hyperlipidemia, unspecified: Secondary | ICD-10-CM | POA: Diagnosis not present

## 2022-10-09 DIAGNOSIS — R918 Other nonspecific abnormal finding of lung field: Secondary | ICD-10-CM | POA: Diagnosis not present

## 2022-10-09 DIAGNOSIS — I10 Essential (primary) hypertension: Secondary | ICD-10-CM | POA: Diagnosis present

## 2022-10-09 DIAGNOSIS — N184 Chronic kidney disease, stage 4 (severe): Secondary | ICD-10-CM | POA: Diagnosis not present

## 2022-10-09 DIAGNOSIS — Z8711 Personal history of peptic ulcer disease: Secondary | ICD-10-CM

## 2022-10-09 DIAGNOSIS — I959 Hypotension, unspecified: Secondary | ICD-10-CM | POA: Diagnosis present

## 2022-10-09 DIAGNOSIS — Z8249 Family history of ischemic heart disease and other diseases of the circulatory system: Secondary | ICD-10-CM

## 2022-10-09 DIAGNOSIS — R6520 Severe sepsis without septic shock: Secondary | ICD-10-CM | POA: Diagnosis present

## 2022-10-09 DIAGNOSIS — D631 Anemia in chronic kidney disease: Secondary | ICD-10-CM | POA: Diagnosis not present

## 2022-10-09 DIAGNOSIS — G629 Polyneuropathy, unspecified: Secondary | ICD-10-CM | POA: Diagnosis present

## 2022-10-09 DIAGNOSIS — Z880 Allergy status to penicillin: Secondary | ICD-10-CM

## 2022-10-09 DIAGNOSIS — J690 Pneumonitis due to inhalation of food and vomit: Secondary | ICD-10-CM | POA: Diagnosis present

## 2022-10-09 DIAGNOSIS — Z8042 Family history of malignant neoplasm of prostate: Secondary | ICD-10-CM | POA: Diagnosis not present

## 2022-10-09 DIAGNOSIS — Z8673 Personal history of transient ischemic attack (TIA), and cerebral infarction without residual deficits: Secondary | ICD-10-CM

## 2022-10-09 DIAGNOSIS — A419 Sepsis, unspecified organism: Principal | ICD-10-CM | POA: Diagnosis present

## 2022-10-09 DIAGNOSIS — I4460 Unspecified fascicular block: Secondary | ICD-10-CM | POA: Diagnosis present

## 2022-10-09 DIAGNOSIS — D638 Anemia in other chronic diseases classified elsewhere: Secondary | ICD-10-CM | POA: Diagnosis present

## 2022-10-09 DIAGNOSIS — Z888 Allergy status to other drugs, medicaments and biological substances status: Secondary | ICD-10-CM

## 2022-10-09 DIAGNOSIS — I700 Atherosclerosis of aorta: Secondary | ICD-10-CM | POA: Diagnosis not present

## 2022-10-09 DIAGNOSIS — M1990 Unspecified osteoarthritis, unspecified site: Secondary | ICD-10-CM | POA: Diagnosis present

## 2022-10-09 DIAGNOSIS — J9601 Acute respiratory failure with hypoxia: Secondary | ICD-10-CM | POA: Diagnosis not present

## 2022-10-09 DIAGNOSIS — R0602 Shortness of breath: Secondary | ICD-10-CM | POA: Diagnosis not present

## 2022-10-09 DIAGNOSIS — J180 Bronchopneumonia, unspecified organism: Secondary | ICD-10-CM | POA: Diagnosis present

## 2022-10-09 DIAGNOSIS — R0902 Hypoxemia: Secondary | ICD-10-CM | POA: Diagnosis not present

## 2022-10-09 HISTORY — DX: Fracture of unspecified metatarsal bone(s), right foot, initial encounter for closed fracture: S92.301A

## 2022-10-09 LAB — PROTIME-INR
INR: 1.3 — ABNORMAL HIGH (ref 0.8–1.2)
Prothrombin Time: 16.2 seconds — ABNORMAL HIGH (ref 11.4–15.2)

## 2022-10-09 LAB — CBC WITH DIFFERENTIAL/PLATELET
Abs Immature Granulocytes: 0.11 10*3/uL — ABNORMAL HIGH (ref 0.00–0.07)
Basophils Absolute: 0 10*3/uL (ref 0.0–0.1)
Basophils Relative: 0 %
Eosinophils Absolute: 0.1 10*3/uL (ref 0.0–0.5)
Eosinophils Relative: 0 %
HCT: 30.6 % — ABNORMAL LOW (ref 36.0–46.0)
Hemoglobin: 10.3 g/dL — ABNORMAL LOW (ref 12.0–15.0)
Immature Granulocytes: 1 %
Lymphocytes Relative: 10 %
Lymphs Abs: 1.6 10*3/uL (ref 0.7–4.0)
MCH: 29.4 pg (ref 26.0–34.0)
MCHC: 33.7 g/dL (ref 30.0–36.0)
MCV: 87.4 fL (ref 80.0–100.0)
Monocytes Absolute: 1.2 10*3/uL — ABNORMAL HIGH (ref 0.1–1.0)
Monocytes Relative: 8 %
Neutro Abs: 13.1 10*3/uL — ABNORMAL HIGH (ref 1.7–7.7)
Neutrophils Relative %: 81 %
Platelets: 324 10*3/uL (ref 150–400)
RBC: 3.5 MIL/uL — ABNORMAL LOW (ref 3.87–5.11)
RDW: 13.1 % (ref 11.5–15.5)
WBC: 16.1 10*3/uL — ABNORMAL HIGH (ref 4.0–10.5)
nRBC: 0 % (ref 0.0–0.2)

## 2022-10-09 LAB — MRSA NEXT GEN BY PCR, NASAL: MRSA by PCR Next Gen: NOT DETECTED

## 2022-10-09 LAB — COMPREHENSIVE METABOLIC PANEL
ALT: 25 U/L (ref 0–44)
AST: 62 U/L — ABNORMAL HIGH (ref 15–41)
Albumin: 2.8 g/dL — ABNORMAL LOW (ref 3.5–5.0)
Alkaline Phosphatase: 72 U/L (ref 38–126)
Anion gap: 12 (ref 5–15)
BUN: 80 mg/dL — ABNORMAL HIGH (ref 8–23)
CO2: 13 mmol/L — ABNORMAL LOW (ref 22–32)
Calcium: 8.1 mg/dL — ABNORMAL LOW (ref 8.9–10.3)
Chloride: 106 mmol/L (ref 98–111)
Creatinine, Ser: 4.03 mg/dL — ABNORMAL HIGH (ref 0.44–1.00)
GFR, Estimated: 10 mL/min — ABNORMAL LOW (ref >60)
Glucose, Bld: 137 mg/dL — ABNORMAL HIGH (ref 70–99)
Potassium: 4.1 mmol/L (ref 3.5–5.1)
Sodium: 131 mmol/L — ABNORMAL LOW (ref 135–145)
Total Bilirubin: 0.6 mg/dL (ref 0.3–1.2)
Total Protein: 6.8 g/dL (ref 6.5–8.1)

## 2022-10-09 LAB — BLOOD GAS, VENOUS
Acid-base deficit: 10.6 mmol/L — ABNORMAL HIGH (ref 0.0–2.0)
Bicarbonate: 13.9 mmol/L — ABNORMAL LOW (ref 20.0–28.0)
O2 Saturation: 83.1 %
Patient temperature: 37
pCO2, Ven: 27 mmHg — ABNORMAL LOW (ref 44–60)
pH, Ven: 7.32 (ref 7.25–7.43)
pO2, Ven: 49 mmHg — ABNORMAL HIGH (ref 32–45)

## 2022-10-09 LAB — RESP PANEL BY RT-PCR (RSV, FLU A&B, COVID)  RVPGX2
Influenza A by PCR: NEGATIVE
Influenza B by PCR: NEGATIVE
Resp Syncytial Virus by PCR: NEGATIVE
SARS Coronavirus 2 by RT PCR: NEGATIVE

## 2022-10-09 LAB — BRAIN NATRIURETIC PEPTIDE: B Natriuretic Peptide: 2337.2 pg/mL — ABNORMAL HIGH (ref 0.0–100.0)

## 2022-10-09 LAB — LACTIC ACID, PLASMA
Lactic Acid, Venous: 2.1 mmol/L (ref 0.5–1.9)
Lactic Acid, Venous: 1.3 mmol/L (ref 0.5–1.9)

## 2022-10-09 LAB — APTT: aPTT: 25 seconds (ref 24–36)

## 2022-10-09 MED ORDER — LACTATED RINGERS IV BOLUS (SEPSIS)
1000.0000 mL | Freq: Once | INTRAVENOUS | Status: AC
  Administered 2022-10-09: 1000 mL via INTRAVENOUS

## 2022-10-09 MED ORDER — VITAMIN D 25 MCG (1000 UNIT) PO TABS
1000.0000 [IU] | ORAL_TABLET | Freq: Every day | ORAL | Status: DC
  Administered 2022-10-09: 1000 [IU] via ORAL
  Filled 2022-10-09: qty 1

## 2022-10-09 MED ORDER — BENZONATATE 100 MG PO CAPS: 200.0000 mg | ORAL_CAPSULE | Freq: Two times a day (BID) | ORAL | Status: DC | PRN

## 2022-10-09 MED ORDER — ALBUTEROL SULFATE (2.5 MG/3ML) 0.083% IN NEBU
2.5000 mg | INHALATION_SOLUTION | RESPIRATORY_TRACT | Status: DC | PRN
  Administered 2022-10-09 (×2): 2.5 mg via RESPIRATORY_TRACT
  Filled 2022-10-09 (×2): qty 3

## 2022-10-09 MED ORDER — ACETAMINOPHEN 325 MG PO TABS
650.0000 mg | ORAL_TABLET | Freq: Four times a day (QID) | ORAL | Status: DC | PRN
  Administered 2022-10-09: 650 mg via ORAL
  Filled 2022-10-09: qty 2

## 2022-10-09 MED ORDER — SODIUM CHLORIDE 0.9 % IV SOLN
2.0000 g | INTRAVENOUS | Status: DC
  Administered 2022-10-09: 2 g via INTRAVENOUS
  Filled 2022-10-09 (×2): qty 20

## 2022-10-09 MED ORDER — OMEPRAZOLE MAGNESIUM 20 MG PO TBEC: 20.0000 mg | DELAYED_RELEASE_TABLET | ORAL | Status: DC

## 2022-10-09 MED ORDER — LORAZEPAM 0.5 MG PO TABS: 0.2500 mg | ORAL_TABLET | Freq: Every day | ORAL | Status: DC

## 2022-10-09 MED ORDER — ONDANSETRON HCL 4 MG/2ML IJ SOLN: 4.0000 mg | Freq: Four times a day (QID) | INTRAMUSCULAR | Status: DC | PRN

## 2022-10-09 MED ORDER — NIFEDIPINE ER OSMOTIC RELEASE 60 MG PO TB24
60.0000 mg | ORAL_TABLET | Freq: Every day | ORAL | Status: DC
  Administered 2022-10-09: 60 mg via ORAL
  Filled 2022-10-09 (×2): qty 1

## 2022-10-09 MED ORDER — ACETAMINOPHEN 650 MG RE SUPP: 650.0000 mg | Freq: Four times a day (QID) | RECTAL | Status: DC | PRN

## 2022-10-09 MED ORDER — LACTATED RINGERS IV SOLN: INTRAVENOUS | Status: DC

## 2022-10-09 MED ORDER — SODIUM CHLORIDE 0.9 % IV BOLUS (SEPSIS): 500.0000 mL | Freq: Once | INTRAVENOUS | Status: DC

## 2022-10-09 MED ORDER — ONDANSETRON HCL 4 MG PO TABS: 4.0000 mg | ORAL_TABLET | Freq: Four times a day (QID) | ORAL | Status: DC | PRN

## 2022-10-09 MED ORDER — SODIUM CHLORIDE 0.9 % IV SOLN: INTRAVENOUS | Status: DC | PRN

## 2022-10-09 MED ORDER — VANCOMYCIN HCL IN DEXTROSE 1-5 GM/200ML-% IV SOLN
1000.0000 mg | Freq: Once | INTRAVENOUS | Status: AC
  Administered 2022-10-09: 1000 mg via INTRAVENOUS
  Filled 2022-10-09: qty 200

## 2022-10-09 MED ORDER — GABAPENTIN 300 MG PO CAPS
300.0000 mg | ORAL_CAPSULE | Freq: Two times a day (BID) | ORAL | Status: DC
  Administered 2022-10-09 (×2): 300 mg via ORAL
  Filled 2022-10-09 (×2): qty 1

## 2022-10-09 MED ORDER — PANTOPRAZOLE SODIUM 40 MG PO TBEC
40.0000 mg | DELAYED_RELEASE_TABLET | ORAL | Status: DC
  Administered 2022-10-09: 40 mg via ORAL
  Filled 2022-10-09: qty 1

## 2022-10-09 MED ORDER — SODIUM CHLORIDE 0.9 % IV SOLN: Freq: Once | INTRAVENOUS | Status: AC

## 2022-10-09 MED ORDER — SODIUM CHLORIDE 0.9 % IV SOLN
500.0000 mg | INTRAVENOUS | Status: DC
  Administered 2022-10-09: 500 mg via INTRAVENOUS
  Filled 2022-10-09: qty 5

## 2022-10-09 MED ORDER — ORAL CARE MOUTH RINSE: 15.0000 mL | OROMUCOSAL | Status: DC

## 2022-10-09 MED ORDER — CHLORHEXIDINE GLUCONATE CLOTH 2 % EX PADS
6.0000 | MEDICATED_PAD | Freq: Every day | CUTANEOUS | Status: DC
  Administered 2022-10-09: 6 via TOPICAL

## 2022-10-09 MED ORDER — ORAL CARE MOUTH RINSE: 15.0000 mL | OROMUCOSAL | Status: DC | PRN

## 2022-10-09 NOTE — ED Provider Notes (Signed)
[facility] DEPT Provider Note   CSN: [id number] Arrival date & time: 10/13/2022  0448     History  Chief Complaint  Patient presents with   Shortness of Breath    Jacqueline Dodson is a 89 y.o. female.  HPI     This is a 89 year old female who presents with cough and shortness of breath.  Patient reports that she was diagnosed with pneumonia yesterday.  She has been on antibiotics.  She reports worsening shortness of breath.  Per EMS O2 sats were in the mid 80s upon their arrival.  She is also notably tachypneic and hypotensive.  She was placed on a nonrebreather.  Patient lives in a living facility.  Unknown sick contacts.  She is full code.  Home Medications Prior to Admission medications   Medication Sig Start Date End Date Taking? Authorizing Provider  acetaminophen (TYLENOL) 500 MG tablet Take 500 mg by mouth as needed.    [provider]  benzonatate (TESSALON) 200 MG capsule Take 1 capsule (200 mg total) by mouth 2 (two) times daily as needed for cough. 02/14/22   Wardell Honour, MD  calcium carbonate (TUMS - DOSED IN MG ELEMENTAL CALCIUM) 500 MG chewable tablet Chew 1 tablet by mouth as needed for indigestion or heartburn.     [provider]  cholecalciferol (VITAMIN D) 25 MCG (1000 UT) tablet Take 1,000 Units by mouth daily.    [provider]  doxycycline (VIBRA-TABS) 100 MG tablet Take 1 tablet (100 mg total) by mouth 2 (two) times daily for 7 days. 10/08/22 10/15/22  Fargo, Amy E, NP  gabapentin (NEURONTIN) 100 MG capsule Take 1 capsule (100 mg total) by mouth 2 (two) times daily. 08/16/22   Mast, Man X, NP  hydrocortisone 2.5 % cream Apply topically 2 (two) times daily as needed. 04/26/21   Mast, Man X, NP  LORazepam (ATIVAN) 0.5 MG tablet Take 0.5 tablets (0.25 mg total) by mouth at bedtime. 10/04/22   Mast, Man X, NP  LORazepam (ATIVAN) 0.5 MG tablet Take 0.5 tablets (0.25 mg total) by mouth at bedtime. 10/03/22    Medina-Vargas, Monina C, NP  Multiple Vitamin (MULTIVITAMIN) tablet Take 1 tablet by mouth daily.    [provider]  Multiple Vitamins-Minerals (PRESERVISION AREDS 2 PO) Take 1 tablet by mouth daily.    [provider]  NIFEdipine (PROCARDIA XL/NIFEDICAL XL) 60 MG 24 hr tablet TAKE 1 TABLET DAILY FOR    BLOOD PRESSURE 05/21/22   Mast, Man X, NP  omeprazole (PRILOSEC OTC) 20 MG tablet Take 20 mg by mouth every other day.     [provider]  Probiotic Product (PROBIOTIC ADVANCED PO) Take by mouth.    [provider]      Allergies    Amlodipine, Enalapril, Hctz [hydrochlorothiazide], Penicillins, and Amoxicillin    Review of Systems   Review of Systems  Constitutional:  Positive for chills. Negative for fever.  Respiratory:  Positive for cough and shortness of breath.   Cardiovascular:  Negative for chest pain.  All other systems reviewed and are negative.   Physical Exam Updated Vital Signs BP 102/60   Pulse 84   Temp 98.1 F (36.7 C) (Oral)   Resp (!) 24   SpO2 91%  Physical Exam Vitals and nursing note reviewed.  Constitutional:      Appearance: She is well-developed.     Comments: Elderly, ill-appearing, no acute distress  HENT:     Head: Normocephalic and  atraumatic.  Eyes:     Pupils: Pupils are equal, round, and reactive to light.  Cardiovascular:     Rate and Rhythm: Normal rate and regular rhythm.     Heart sounds: Normal heart sounds.  Pulmonary:     Effort: Tachypnea present. No respiratory distress.     Breath sounds: No wheezing.     Comments: Fair air movement in all lung fields, rhonchorous breath sounds bilateral lower lobes Abdominal:     General: Bowel sounds are normal.     Palpations: Abdomen is soft.  Musculoskeletal:     Cervical back: Neck supple.     Right lower leg: No edema.     Left lower leg: No edema.  Skin:    General: Skin is warm and dry.  Neurological:     Mental Status: She is alert and oriented  to person, place, and time.  Psychiatric:        Mood and Affect: Mood normal.     ED Results / Procedures / Treatments   Labs (all labs ordered are listed, but only abnormal results are displayed) Labs Reviewed  LACTIC ACID, PLASMA - Abnormal; Notable for the following components:      Result Value   Lactic Acid, Venous 2.1 (*)    All other components within normal limits  COMPREHENSIVE METABOLIC PANEL - Abnormal; Notable for the following components:   Sodium 131 (*)    CO2 13 (*)    Glucose, Bld 137 (*)    BUN 80 (*)    Creatinine, Ser 4.03 (*)    Calcium 8.1 (*)    Albumin 2.8 (*)    AST 62 (*)    GFR, Estimated 10 (*)    All other components within normal limits  CBC WITH DIFFERENTIAL/PLATELET - Abnormal; Notable for the following components:   WBC 16.1 (*)    RBC 3.50 (*)    Hemoglobin 10.3 (*)    HCT 30.6 (*)    Neutro Abs 13.1 (*)    Monocytes Absolute 1.2 (*)    Abs Immature Granulocytes 0.11 (*)    All other components within normal limits  PROTIME-INR - Abnormal; Notable for the following components:   Prothrombin Time 16.2 (*)    INR 1.3 (*)    All other components within normal limits  RESP PANEL BY RT-PCR (RSV, FLU A&B, COVID)  RVPGX2  CULTURE, BLOOD (ROUTINE X 2)  CULTURE, BLOOD (ROUTINE X 2)  URINE CULTURE  APTT  LACTIC ACID, PLASMA  URINALYSIS, ROUTINE W REFLEX MICROSCOPIC  BLOOD GAS, VENOUS    EKG None  Radiology DG Chest Port 1 View  Result Date: 11/04/2022 CLINICAL DATA:  89 year old female with possible sepsis. EXAM: PORTABLE CHEST 1 VIEW COMPARISON:  Portable chest 08/24/2019 and earlier. FINDINGS: Portable AP semi upright view at at 0530 hours. Mildly rotated to the left. Chronic pulmonary hyperinflation suspected. Superimposed veiling opacity at both lung bases, similar to 2020, but confluent increased bilateral perihilar opacity since that time. Similar background increased pulmonary interstitial markings diffusely. No pneumothorax. No air  bronchograms. Mediastinal contours appear stable. Calcified aortic atherosclerosis. Visualized tracheal air column is within normal limits. No acute osseous abnormality identified. IMPRESSION: Suspected chronic pulmonary hyperinflation with evidence of small bilateral pleural effusions (may be chronic, similar in 2020) and Acute nonspecific bilateral perihilar opacity. Differential considerations include acute pulmonary edema, bilateral bronchopneumonia, aspiration, and less likely lung mass given symmetry. Electronically Signed   By: Genevie Ann M.D.   On: 10/14/2022  05:44    Procedures .Critical Care  Performed by: Merryl Hacker, MD Authorized by: Merryl Hacker, MD   Critical care provider statement:    Critical care time (minutes):  60   Critical care was necessary to treat or prevent imminent or life-threatening deterioration of the following conditions:  Respiratory failure and sepsis   Critical care was time spent personally by me on the following activities:  Development of treatment plan with patient or surrogate, discussions with consultants, evaluation of patient's response to treatment, examination of patient, ordering and review of laboratory studies, ordering and review of radiographic studies, ordering and performing treatments and interventions, pulse oximetry, re-evaluation of patient's condition and review of old charts     Medications Ordered in ED Medications  lactated ringers infusion ( Intravenous New Bag/Given 11/02/2022 0626)  lactated ringers bolus 1,000 mL (1,000 mLs Intravenous New Bag/Given 11/04/2022 0624)  cefTRIAXone (ROCEPHIN) 2 g in sodium chloride 0.9 % 100 mL IVPB (2 g Intravenous New Bag/Given 11/02/2022 0546)  azithromycin (ZITHROMAX) 500 mg in sodium chloride 0.9 % 250 mL IVPB (500 mg Intravenous New Bag/Given 11/06/2022 0624)  vancomycin (VANCOCIN) IVPB 1000 mg/200 mL premix (has no administration in time range)    ED Course/ Medical Decision Making/ A&P Clinical  Course as of 11/06/2022 0719  Tue Oct 09, 2022  0659 On recheck, patient O2 sats mid 80s on a nonrebreather.  Family is at the bedside.  Patient is still mentating.  After ongoing discussion with the family including goals of care, they would like to pursue the BiPAP.  If intubation would potentially help reverse her pneumonia they would be amenable to this but do not want her to be on a ventilator for a prolonged period of time.  After discussion regarding CPR, they do not want CPR if her heart were to stop.  Patient and her children at the bedside are both agreeable. [CH]    Clinical Course User Index [CH] Gevon Markus, Barbette Hair, MD                           Medical Decision Making Amount and/or Complexity of Data Reviewed Labs: ordered. Radiology: ordered. ECG/medicine tests: ordered.  Risk Prescription drug management. Decision regarding hospitalization.   This patient presents to the ED for concern of shortness of breath, hypoxia, this involves an extensive number of treatment options, and is a complaint that carries with it a high risk of complications and morbidity.  I considered the following differential and admission for this acute, potentially life threatening condition.  The differential diagnosis includes pneumonia, COVID, influenza, pulmonary edema, pneumothorax  MDM:    This is a 89 year old female who presents with hypoxia.  Was diagnosed with pneumonia at her living facility yesterday.  Has had progressive O2 requirements.  Per EMS she was hypoxic, hypotensive.  She was placed on a nonrebreather.  On my initial assessment she is alert and oriented.  She is able to provide history and discuss goals of care.  Initially full code.  She appears septic.  Initially afebrile but blood pressure 95/50 and SpO2 92% on 15 L.  Full workup was initiated.  She received 1 L of fluids by EMS.  She was given an additional liter of LR for her hypotension for full 30 cc/kg.  She was started on  broad-spectrum antibiotics including Rocephin and azithromycin.  Vancomycin additionally ordered after chest x-ray showed multifocal pneumonia.  Patient had progressive hypoxia on  15 L.  Her daughter and son are at the bedside.  See discussions regarding goals of care above.  Will trial BiPAP.  ABG and repeat COVID and influenza testing are pending.  Labs are otherwise notable for an AKI with a creatinine of 4.  Last creatinine is 1.63 years ago.  Unclear whether this is an acute change versus over several years.  Patient tolerating fluids and maintenance fluids ordered.  Blood pressure responsive to fluids.  Lactate 2.1.  Will plan for admission to the stepdown unit.  Discussed with the hospitalist.  (Labs, imaging, consults)  Labs: I Ordered, and personally interpreted labs.  The pertinent results include: CBC, CMP, lactate  Imaging Studies ordered: I ordered imaging studies including chest x-ray I independently visualized and interpreted imaging. I agree with the radiologist interpretation  Additional history obtained from EMS, children at bedside.  External records from outside source obtained and reviewed including prior evaluations  Cardiac Monitoring: The patient was maintained on a cardiac monitor.  I personally viewed and interpreted the cardiac monitored which showed an underlying rhythm of: sinus rhythm  Reevaluation: After the interventions noted above, I reevaluated the patient and found that they have :worsened  Social Determinants of Health:  lives independently in living facility  Disposition: Admit  Co morbidities that complicate the patient evaluation  Past Medical History:  Diagnosis Date   Abnormality of gait    Acute, but ill-defined, cerebrovascular disease    Anxiety    Arthritis    Chronic kidney disease, stage II (mild)    Cystocele, midline    Dermatophytosis of the body    Disturbance of skin sensation    Dizziness and giddiness    Duodenal ulcer,  unspecified as acute or chronic, without hemorrhage, perforation, or obstruction    Edema    Elevated blood pressure reading without diagnosis of hypertension    GERD (gastroesophageal reflux disease)    Hemorrhoids    Hypertension    Left bundle branch hemiblock    Lumbago    Macular degeneration (senile) of retina, unspecified    Myalgia and myositis, unspecified    Orthostatic hypotension    Other abnormal blood chemistry    Other malaise and fatigue    Personal history of fall    Polyneuropathy in other diseases classified elsewhere ([place]) 02/16/2014   Postmenopausal atrophic vaginitis    Primary cerebellar degeneration (HCC)    Rectocele    Restless legs syndrome (RLS)    Unspecified constipation    Unspecified hereditary and idiopathic peripheral neuropathy    Urgency of urination      Medicines Meds ordered this encounter  Medications   lactated ringers infusion   lactated ringers bolus 1,000 mL    Order Specific Question:   Reason 30 mL/kg dose is not being ordered    Answer:   Fluid given earlier or by EMS - completing remainder of 30 mL/kg by TBW   cefTRIAXone (ROCEPHIN) 2 g in sodium chloride 0.9 % 100 mL IVPB    Order Specific Question:   Antibiotic Indication:    Answer:   CAP   azithromycin (ZITHROMAX) 500 mg in sodium chloride 0.9 % 250 mL IVPB    Order Specific Question:   Antibiotic Indication:    Answer:   CAP   vancomycin (VANCOCIN) IVPB 1000 mg/200 mL premix    Order Specific Question:   Indication:    Answer:   Sepsis    I have reviewed the patients home medicines and  have made adjustments as needed  Problem List / ED Course: Problem List Items Addressed This Visit   None Visit Diagnoses     Community acquired pneumonia of left lower lobe of lung    -  Primary   Relevant Medications   cefTRIAXone (ROCEPHIN) 2 g in sodium chloride 0.9 % 100 mL IVPB   azithromycin (ZITHROMAX) 500 mg in sodium chloride 0.9 % 250 mL IVPB   vancomycin (VANCOCIN) IVPB  1000 mg/200 mL premix (Start on 10/21/2022  7:30 AM)   Severe sepsis (HCC)       AKI (acute kidney injury) ([facility])       Acute respiratory failure with hypoxia ([place])                       Final Clinical Impression(s) / ED Diagnoses Final diagnoses:  Community acquired pneumonia of left lower lobe of lung  Severe sepsis ([facility])  AKI (acute kidney injury) ([place])    Rx / DC Orders ED Discharge Orders     None         Merryl Hacker, MD 10/21/2022 [phone]

## 2022-10-09 NOTE — ED Triage Notes (Signed)
Pt BIB [facility] from [facility]. Pt diagnosed with lower lobe pneumonia yesterday. C/o increased shortness of breath throughout the day. Upon ems arrival pt sat's mid 54s. Pt hypotensive, tachypnic, hypocapnic. Pt aox4.  Pt 15L nonrebreather.  Ems administered LR bolus   90/38  86 HR 28 RR 178 CBG 19 capnography  98.6 Temp Fine crackles left lower lobes.

## 2022-10-09 NOTE — Sepsis Progress Note (Signed)
Elink monitoring for the code sepsis protocol.  

## 2022-10-09 NOTE — Sepsis Progress Note (Signed)
Note sent to bedside RN via secure chat to verify if blood cultures were collected prior to administering antibiotics.  Per RN, patient is difficult stick, have been unable to collect blood cultures thus far.  IV team consulted.

## 2022-10-09 NOTE — H&P (Signed)
History and Physical    Patient: Jacqueline Dodson EXH:[id number] DOB: June 03, 1927 DOA: 11/07/2022 DOS: the patient was seen and examined on 10/17/2022 PCP: Mast, Man X, NP  Patient coming from: Home  Chief Complaint:  Chief Complaint  Patient presents with   Shortness of Breath   HPI: Jacqueline Dodson is a 89 y.o. female with medical history significant of gait abnormality, cerebrovascular disease, primary cerebellar degeneration, restless leg syndrome, anxiety, osteoarthritis, stage IV CKD, cystocele 8, dermatophytosis, dizziness, duodenal PUD, edema, hypertension, GERD, hemorrhoids, left bundle branch block, lumbago, macular degeneration, myalgia and myositis, orthostatic hypotension, history of falls, polyneuropathy, postmenopausal atrophic vaginitis, constipation, or urgency who is coming to the emergency department due to cough that is occasionally productive and progressively worse dyspnea for the past 2 days.  She was diagnosed with pneumonia yesterday.  She has had subjective fever and night sweats.  Her appetite is decreased.   No fever, chills or night sweats.  Mild sore throat, rhinorrhea.  No hemoptysis.  No chest pain, palpitations, diaphoresis, PND, orthopnea or pitting edema of the lower extremities.  No appetite changes, abdominal pain, diarrhea, constipation, melena or hematochezia.  No flank pain, dysuria, frequency or hematuria.  No polyuria, polydipsia, polyphagia or blurred vision.  ED course: Initial vital signs were temperature 98.1 F, pulse 87, respirations 27, BP 95/50 mmHg 92% on NRB mask at 15 L/min.  Patient was subsequently placed on BiPAP.  She received LR 1000 mL bolus followed by 150 mL over 20 hours, ceftriaxone 2 g IVPB and azithromycin 500 mg IVPB.  She also received a dose of vancomycin.  Lab work: CBC showed a white count of 16.1, hemoglobin 10.3 g/dL platelets 324.  PT 16.2, INR 1.3 and PTT 25.  Negative coronavirus, influenza and RSV PCR.  Lactic acid is 2.1 mmol/L.  CBC shows  a sodium 131, potassium 4.1, chloride 106 and CO2 13 mmol/L with an anion gap of 12.  Glucose 137, BUN 80, creatinine 4.03 and calcium 8.1 mg/dL.  Albumin is 2.8 g/dL and AST 62 units/L, the rest of the LFTs were normal.  Imaging: Portable 1 view chest radiograph with suspected chronic pulmonary hyperinflation, small bilateral pleural effusions, acute nonspecific bilateral perihilar opacity that could be acute pulmonary edema, bilateral bronchopneumonia, aspiration and less likely a lung mass given symmetry.   Review of Systems: As mentioned in the history of present illness. All other systems reviewed and are negative. Past Medical History:  Diagnosis Date   Abnormality of gait    Acute, but ill-defined, cerebrovascular disease    Anxiety    Arthritis    Chronic kidney disease, stage II (mild)    Cystocele, midline    Dermatophytosis of the body    Disturbance of skin sensation    Dizziness and giddiness    Duodenal ulcer, unspecified as acute or chronic, without hemorrhage, perforation, or obstruction    Edema    Elevated blood pressure reading without diagnosis of hypertension    GERD (gastroesophageal reflux disease)    Hemorrhoids    Hypertension    Left bundle branch hemiblock    Lumbago    Macular degeneration (senile) of retina, unspecified    Myalgia and myositis, unspecified    Orthostatic hypotension    Other abnormal blood chemistry    Other malaise and fatigue    Personal history of fall    Polyneuropathy in other diseases classified elsewhere ([place]) 02/16/2014   Postmenopausal atrophic vaginitis    Primary cerebellar degeneration ([place])  Rectocele    Restless legs syndrome (RLS)    Unspecified constipation    Unspecified hereditary and idiopathic peripheral neuropathy    Urgency of urination    Past Surgical History:  Procedure Laterality Date   ABDOMINAL HYSTERECTOMY  1972   APPENDECTOMY  1945   CATARACT EXTRACTION Bilateral    EXTERNAL EAR SURGERY  2010    left outer ear basel cell removed   TONSILLECTOMY  1940   Social History:  reports that she has never smoked. She has never used smokeless tobacco. She reports that she does not drink alcohol and does not use drugs.  Allergies  Allergen Reactions   Amlodipine Other (See Comments)    Reaction:  Critically decreased sodium and potassium levels.     Enalapril Other (See Comments)    Reaction:  Unknown    Hctz [Hydrochlorothiazide] Other (See Comments)    Reaction:  Unknown    Penicillins Other (See Comments)    Reaction:  Unknown    Amoxicillin Swelling and Rash    Family History  Problem Relation Age of Onset   Aortic stenosis Mother    Heart failure Mother    Thrombosis Father    Coronary artery disease Father    Ataxia Sister    Ataxia Brother    Heart disease Brother    Cancer Brother        Prostate   Stroke Brother     Prior to Admission medications   Medication Sig Start Date End Date Taking? Authorizing Provider  acetaminophen (TYLENOL) 500 MG tablet Take 500 mg by mouth as needed.    [provider]  benzonatate (TESSALON) 200 MG capsule Take 1 capsule (200 mg total) by mouth 2 (two) times daily as needed for cough. 02/14/22   Wardell Honour, MD  calcium carbonate (TUMS - DOSED IN MG ELEMENTAL CALCIUM) 500 MG chewable tablet Chew 1 tablet by mouth as needed for indigestion or heartburn.     [provider]  cholecalciferol (VITAMIN D) 25 MCG (1000 UT) tablet Take 1,000 Units by mouth daily.    [provider]  doxycycline (VIBRA-TABS) 100 MG tablet Take 1 tablet (100 mg total) by mouth 2 (two) times daily for 7 days. 10/08/22 10/15/22  Fargo, Amy E, NP  gabapentin (NEURONTIN) 100 MG capsule Take 1 capsule (100 mg total) by mouth 2 (two) times daily. 08/16/22   Mast, Man X, NP  hydrocortisone 2.5 % cream Apply topically 2 (two) times daily as needed. 04/26/21   Mast, Man X, NP  LORazepam (ATIVAN) 0.5 MG tablet Take 0.5 tablets (0.25 mg total) by  mouth at bedtime. 10/04/22   Mast, Man X, NP  LORazepam (ATIVAN) 0.5 MG tablet Take 0.5 tablets (0.25 mg total) by mouth at bedtime. 10/03/22   Medina-Vargas, Monina C, NP  Multiple Vitamin (MULTIVITAMIN) tablet Take 1 tablet by mouth daily.    [provider]  Multiple Vitamins-Minerals (PRESERVISION AREDS 2 PO) Take 1 tablet by mouth daily.    [provider]  NIFEdipine (PROCARDIA XL/NIFEDICAL XL) 60 MG 24 hr tablet TAKE 1 TABLET DAILY FOR    BLOOD PRESSURE 05/21/22   Mast, Man X, NP  omeprazole (PRILOSEC OTC) 20 MG tablet Take 20 mg by mouth every other day.     [provider]  Probiotic Product (PROBIOTIC ADVANCED PO) Take by mouth.    [provider]    Physical Exam: Vitals:   10/13/2022 1478 10/15/2022 0545 10/28/2022 0615 11/01/2022 0700  BP:  (!) 101/48 102/60   Pulse:  84 84   Resp:  (!) 23 (!) 24   Temp:      TempSrc:      SpO2: 92% 92% 91% 93%   Physical Exam Vitals and nursing note reviewed.  Constitutional:      General: She is awake. She is not in acute distress.    Appearance: She is ill-appearing.     Interventions: Face mask in place.     Comments: BiPAP mask on.  HENT:     Head: Normocephalic.     Nose: No rhinorrhea.     Mouth/Throat:     Mouth: Mucous membranes are dry.  Eyes:     General: No scleral icterus.    Pupils: Pupils are equal, round, and reactive to light.  Neck:     Vascular: No JVD.  Cardiovascular:     Rate and Rhythm: Normal rate and regular rhythm.     Heart sounds: S1 normal and S2 normal.  Pulmonary:     Effort: Tachypnea present. No accessory muscle usage.     Breath sounds: Examination of the right-lower field reveals decreased breath sounds. Examination of the left-lower field reveals decreased breath sounds. Decreased breath sounds, wheezing and rhonchi present. No rales.  Abdominal:     General: Bowel sounds are normal. There is no distension.     Palpations: Abdomen is soft.     Tenderness: There is  no abdominal tenderness. There is no guarding.  Musculoskeletal:     Cervical back: Neck supple.     Right lower leg: No edema.     Left lower leg: No edema.     Right foot: Decreased range of motion. Tenderness present.  Skin:    General: Skin is warm and dry.  Neurological:     General: No focal deficit present.     Mental Status: She is alert and oriented to person, place, and time.  Psychiatric:        Mood and Affect: Mood is anxious.        Behavior: Behavior normal. Behavior is cooperative.   Data Reviewed:  There are no new results to review at this time.  Assessment and Plan: Principal Problem:   Acute respiratory failure with hypoxemia ([place]) In the setting of:   Bilateral bronchopneumonia Admit to SDU/inpatient. Continue supplemental oxygen. Continue BiPAP ventilation. Scheduled and as needed bronchodilators. Continue benzonatate as needed. Continue ceftriaxone 1 g IVPB daily. Continue azithromycin 500 mg IVPB daily. Check strep pneumoniae urinary antigen. Check MRSA PCR. Resume vancomycin if positive. Check sputum Gram stain, culture and sensitivity. Follow-up blood culture and sensitivity. Follow-up CBC and chemistry in the morning.  Active Problems:   HTN (hypertension) Continue nifedipine 60 mg p.o. daily. Monitor BP and heart rate.    GERD (gastroesophageal reflux disease) Continue omeprazole or formulary equivalent.    CKD (chronic kidney disease) stage 4, GFR 15-29 ml/min (HCC) With AKI versus progression of disease. The patient looks volume depleted. Will continue time-limited IV fluids. Monitor intake and output. Monitor renal function and electrolytes    Anemia, chronic disease Monitor hematocrit and hemoglobin. Transfuse as needed.    Hyperlipidemia Currently not on medical therapy. Follow-up with PCP as an outpatient.    Peripheral neuropathy   Fracture of metatarsal of right foot, closed Continue acetaminophen as needed. Primary  increase gabapentin for pain control recently. Continue gabapentin 300 mg p.o. twice daily. Continue foot immobilization when getting out of bed.  Advance Care Planning:   Code Status: DNR  May use BiPAP or ET intubation/mechanical ventilation, but no CPR.  Consults:   Family Communication: Her daughter was at bedside and provided history.  Severity of Illness: The appropriate patient status for this patient is INPATIENT. Inpatient status is judged to be reasonable and necessary in order to provide the required intensity of service to ensure the patient's safety. The patient's presenting symptoms, physical exam findings, and initial radiographic and laboratory data in the context of their chronic comorbidities is felt to place them at high risk for further clinical deterioration. Furthermore, it is not anticipated that the patient will be medically stable for discharge from the hospital within 2 midnights of admission.   * I certify that at the point of admission it is my clinical judgment that the patient will require inpatient hospital care spanning beyond 2 midnights from the point of admission due to high intensity of service, high risk for further deterioration and high frequency of surveillance required.*  Author: Reubin Milan, MD 11/05/2022 8:39 AM  For on call review [email or web address].   This document was prepared using Dragon voice recognition software and may contain some unintended transcription errors.

## 2022-10-09 NOTE — Progress Notes (Signed)
A consult was received from an ED physician for [place] per pharmacy dosing.  The patient's profile has been reviewed for ht/wt/allergies/indication/available labs.   A one time order has been placed for Vanco 1g IV x 1.  Further antibiotics/pharmacy consults should be ordered by admitting physician if indicated.                        Wilene Pharo S. Alford Highland, PharmD, BCPS Clinical Staff Pharmacist [email or web address] Thank you, Wayland [place] 10/26/2022  7:19 AM

## 2022-10-09 NOTE — ED Notes (Signed)
Attempted In&out Cath

## 2022-10-10 ENCOUNTER — Inpatient Hospital Stay (HOSPITAL_COMMUNITY): Payer: Medicare Other

## 2022-10-10 DIAGNOSIS — J9601 Acute respiratory failure with hypoxia: Secondary | ICD-10-CM | POA: Diagnosis not present

## 2022-10-10 LAB — BLOOD GAS, ARTERIAL
Acid-base deficit: 17.4 mmol/L — ABNORMAL HIGH (ref 0.0–2.0)
Bicarbonate: 10.1 mmol/L — ABNORMAL LOW (ref 20.0–28.0)
Delivery systems: POSITIVE
Drawn by: 23281
FIO2: 100 %
O2 Saturation: 85.2 %
Patient temperature: 37
pCO2 arterial: 29 mmHg — ABNORMAL LOW (ref 32–48)
pH, Arterial: 7.15 — CL (ref 7.35–7.45)
pO2, Arterial: 57 mmHg — ABNORMAL LOW (ref 83–108)

## 2022-10-10 MED ORDER — MORPHINE BOLUS VIA INFUSION: 2.0000 mg | INTRAVENOUS | Status: DC | PRN

## 2022-10-10 MED ORDER — PHENYLEPHRINE 80 MCG/ML (10ML) SYRINGE FOR IV PUSH (FOR BLOOD PRESSURE SUPPORT)
PREFILLED_SYRINGE | INTRAVENOUS | Status: AC
  Filled 2022-10-10: qty 10

## 2022-10-10 MED ORDER — ATROPINE SULFATE 1 % OP SOLN: 4.0000 [drp] | OPHTHALMIC | Status: DC | PRN

## 2022-10-10 MED ORDER — NOREPINEPHRINE 4 MG/250ML-% IV SOLN: 2.0000 ug/min | INTRAVENOUS | Status: DC

## 2022-10-10 MED ORDER — MORPHINE 100MG IN NS 100ML (1MG/ML) PREMIX INFUSION
1.0000 mg/h | INTRAVENOUS | Status: DC
  Administered 2022-10-10: 1 mg/h via INTRAVENOUS
  Filled 2022-10-10: qty 100

## 2022-10-10 MED ORDER — NOREPINEPHRINE 4 MG/250ML-% IV SOLN
INTRAVENOUS | Status: AC
  Filled 2022-10-10: qty 250

## 2022-10-10 MED ORDER — FUROSEMIDE 10 MG/ML IJ SOLN
80.0000 mg | Freq: Once | INTRAMUSCULAR | Status: AC
  Administered 2022-10-10: 80 mg via INTRAVENOUS

## 2022-10-10 MED ORDER — MORPHINE SULFATE (PF) 2 MG/ML IV SOLN
0.5000 mg | INTRAVENOUS | Status: DC | PRN
  Administered 2022-10-10: 1 mg via INTRAVENOUS
  Administered 2022-10-10: 0.5 mg via INTRAVENOUS
  Administered 2022-10-10 (×4): 1 mg via INTRAVENOUS
  Filled 2022-10-10 (×6): qty 1

## 2022-10-10 MED ORDER — FUROSEMIDE 10 MG/ML IJ SOLN: 20.0000 mg | Freq: Once | INTRAMUSCULAR | Status: DC

## 2022-10-10 MED ORDER — LORAZEPAM 2 MG/ML PO CONC
1.0000 mg | ORAL | Status: DC | PRN
  Filled 2022-10-10: qty 0.5

## 2022-10-10 MED ORDER — BIOTENE DRY MOUTH MT LIQD: 15.0000 mL | OROMUCOSAL | Status: DC | PRN

## 2022-10-10 MED ORDER — LORAZEPAM 1 MG PO TABS: 1.0000 mg | ORAL_TABLET | ORAL | Status: DC | PRN

## 2022-10-10 MED ORDER — SODIUM CHLORIDE 0.9 % IV SOLN: 250.0000 mL | INTRAVENOUS | Status: DC

## 2022-10-10 MED ORDER — LORAZEPAM 2 MG/ML IJ SOLN
0.2500 mg | Freq: Once | INTRAMUSCULAR | Status: AC | PRN
  Administered 2022-10-10: 0.25 mg via INTRAVENOUS
  Filled 2022-10-10: qty 1

## 2022-10-10 MED ORDER — FUROSEMIDE 10 MG/ML IJ SOLN
INTRAMUSCULAR | Status: AC
  Filled 2022-10-10: qty 8

## 2022-10-11 ENCOUNTER — Encounter: Payer: Medicare Other | Admitting: Nurse Practitioner

## 2022-10-11 ENCOUNTER — Ambulatory Visit: Payer: Medicare Other | Admitting: Physician Assistant

## 2022-10-14 LAB — CULTURE, BLOOD (ROUTINE X 2)
Culture: NO GROWTH
Culture: NO GROWTH
Special Requests: ADEQUATE
Special Requests: ADEQUATE

## 2022-10-17 ENCOUNTER — Telehealth: Payer: Self-pay | Admitting: Adult Health

## 2022-10-17 NOTE — Telephone Encounter (Signed)
Pt's daughter, Quentin Mulling notified [facility] pt has passed away on 11-10-2022.  Gave Ms. Seal our condolences

## 2022-10-17 NOTE — Telephone Encounter (Signed)
Sympathy card initiated.

## 2022-11-06 ENCOUNTER — Other Ambulatory Visit: Payer: Medicare Other

## 2022-11-08 ENCOUNTER — Encounter: Payer: Medicare Other | Admitting: Nurse Practitioner

## 2022-11-08 NOTE — Death Summary Note (Signed)
DEATH SUMMARY   Patient Details  Name: Jacqueline Dodson MRN: [id number] DOB: [birth year] VPX:TGGY, Man X, NP Admission/Discharge Information   Admit Date:  November 02, 2022  Date of Death: Date of Death: 03-Nov-2022  Time of Death: Time of Death: 0935  Length of Stay: 1   Principle Cause of death:   Hypoxic respiratory failure on admission  Hospital Diagnoses: Principal Problem:   Acute respiratory failure with hypoxemia ([place]) Active Problems:   HTN (hypertension)   Peripheral neuropathy   GERD (gastroesophageal reflux disease)   CKD (chronic kidney disease) stage 4, GFR 15-29 ml/min (HCC)   Anemia, chronic disease   Hyperlipidemia   Fracture of metatarsal of right foot, closed   Bilateral bronchopneumonia   Hospital Course:  89 year old white female known prior CVA cerebellar degeneration CKD 4 PUD edema LBB lumbago and multiple other issues presented with worsening dyspnea Had been seen at [facility] where she stays and was found to have sats in the 88 percentile on 10/08/2022 Doxycycline was prescribed by practitioner She significantly worsened on 1/2 sats were mid 80s and she was placed on nonrebreather She was oriented-sats were 92% on 15 L after initiation soft blood pressures received 1 L crystalloid and then 30 cc/KG Started on Rocephin and azithromycin BiPAP was trialed Creatinine was 4 up from 1.6   DNR was discussed and patient was transitioned to comfort measures after long discussion  Family elected to pursue comfort measures and patient was placed on morphine gtt. - After taking off BiPAP patient was comfortable with decision making and ultimately passed at around 9:35 AM  Family was available and around patient and we had good discussions with them      The results of significant diagnostics from this hospitalization (including imaging, microbiology, ancillary and laboratory) are listed below for reference.   Significant Diagnostic Studies: DG CHEST PORT 1  VIEW  Result Date: Nov 03, 2022 CLINICAL DATA:  Increasing shortness of breath. EXAM: PORTABLE CHEST 1 VIEW COMPARISON:  November 02, 2022. FINDINGS: The heart size and mediastinal contours are stable. There is atherosclerotic calcification of the aorta. Increasing perihilar airspace opacities are noted in the lungs bilaterally with consolidation in the right upper lobe. There are small bilateral pleural effusions. No pneumothorax. No acute osseous abnormality. IMPRESSION: 1. Increasing perihilar airspace opacities bilaterally with new consolidation in the right upper lobe. 2. Small bilateral pleural effusions. Electronically Signed   By: Brett Fairy M.D.   On: 11-03-2022 01:23   DG Chest Port 1 View  Result Date: Nov 02, 2022 CLINICAL DATA:  89 year old female with possible sepsis. EXAM: PORTABLE CHEST 1 VIEW COMPARISON:  Portable chest 08/24/2019 and earlier. FINDINGS: Portable AP semi upright view at at 0530 hours. Mildly rotated to the left. Chronic pulmonary hyperinflation suspected. Superimposed veiling opacity at both lung bases, similar to 2020, but confluent increased bilateral perihilar opacity since that time. Similar background increased pulmonary interstitial markings diffusely. No pneumothorax. No air bronchograms. Mediastinal contours appear stable. Calcified aortic atherosclerosis. Visualized tracheal air column is within normal limits. No acute osseous abnormality identified. IMPRESSION: Suspected chronic pulmonary hyperinflation with evidence of small bilateral pleural effusions (may be chronic, similar in 01-12-19) and Acute nonspecific bilateral perihilar opacity. Differential considerations include acute pulmonary edema, bilateral bronchopneumonia, aspiration, and less likely lung mass given symmetry. Electronically Signed   By: Genevie Ann M.D.   On: November 02, 2022 05:44   XR Sacrum/Coccyx  Result Date: 10/04/2022 Views of her coccyx were taken today.  She does have significant degenerative changes in the  lower spine and into the coccyx and sacrum.  View is somewhat obliterated by stool in the bowel.  Cannot appreciate any acute fractures  CT Foot Right Wo Contrast  Result Date: 09/26/2022 CLINICAL DATA:  Foot pain, stress fracture suspected. Patient fell this morning. EXAM: CT OF THE RIGHT FOOT WITHOUT CONTRAST TECHNIQUE: Multidetector CT imaging of the right foot was performed according to the standard protocol. Multiplanar CT image reconstructions were also generated. RADIATION DOSE REDUCTION: This exam was performed according to the departmental dose-optimization program which includes automated exposure control, adjustment of the mA and/or kV according to patient size and/or use of iterative reconstruction technique. COMPARISON:  Radiographs same date. FINDINGS: Bones/Joint/Cartilage The bones are diffusely demineralized. There are multiple acute fractures involving the 2nd through 5th metatarsal bases, the cuboid, the lateral cuneiform and anterior process of the calcaneus. Some of these fractures demonstrate intra-articular extension, most notable the fracture involving the 4th metatarsal base. There is minimal displacement, and no subluxation at the Lisfranc joint. Ligaments Suboptimally assessed by CT. Muscles and Tendons As evaluated by CT, the ankle tendons are intact without significant tenosynovitis. No focal forefoot muscular abnormalities are identified. There is generalized muscular atrophy. Soft tissues Soft tissue swelling in the midfoot and base of the forefoot without focal fluid collection, foreign body or soft tissue emphysema. IMPRESSION: 1. Multiple acute minimally displaced fractures about the Lisfranc joint as described. There is also a fracture of the anterior process of the calcaneus. 2. No subluxation or dislocation at the Lisfranc joint. 3. Soft tissue swelling without focal fluid collection, foreign body or soft tissue emphysema. Electronically Signed   By: Richardean Sale M.D.    On: 09/26/2022 15:00   DG Knee Complete 4 Views Right  Result Date: 09/26/2022 CLINICAL DATA:  Twisting injury.  Pain. EXAM: RIGHT KNEE - COMPLETE 4+ VIEW COMPARISON:  None Available. FINDINGS: No evidence for an acute fracture. No subluxation or dislocation. Meniscal calcification evident bilaterally. Joint effusion evident. No worrisome lytic or sclerotic osseous abnormality. Mild degenerative spurring noted patellofemoral compartment. IMPRESSION: 1. No acute bony findings. 2. Joint effusion. Electronically Signed   By: Misty Stanley M.D.   On: 09/26/2022 13:53   DG Foot Complete Right  Result Date: 09/26/2022 CLINICAL DATA:  Fall EXAM: RIGHT FOOT COMPLETE - 3 VIEW; RIGHT ANKLE - COMPLETE 3 VIEW COMPARISON:  None Available. FINDINGS: There is no evidence of fracture or dislocation. There is no evidence of arthropathy or other focal bone abnormality. Calcification of the Achilles consistent with chronic tendinitis. IMPRESSION: No acute osseous abnormalities. Electronically Signed   By: Sammie Bench M.D.   On: 09/26/2022 12:54   DG Ankle Complete Right  Result Date: 09/26/2022 CLINICAL DATA:  Fall EXAM: RIGHT FOOT COMPLETE - 3 VIEW; RIGHT ANKLE - COMPLETE 3 VIEW COMPARISON:  None Available. FINDINGS: There is no evidence of fracture or dislocation. There is no evidence of arthropathy or other focal bone abnormality. Calcification of the Achilles consistent with chronic tendinitis. IMPRESSION: No acute osseous abnormalities. Electronically Signed   By: Sammie Bench M.D.   On: 09/26/2022 12:54    Microbiology: Recent Results (from the past 240 hour(s))  Blood Culture (routine x 2)     Status: None (Preliminary result)   Collection Time: 11/02/2022  6:43 AM   Specimen: BLOOD  Result Value Ref Range Status   Specimen Description   Final    BLOOD BLOOD RIGHT FOREARM Performed at [facility] [address], [place], [place] [address]  Special Requests   Final     BOTTLES DRAWN AEROBIC AND ANAEROBIC Blood Culture adequate volume Performed at [facility] [address] [place], [place] [address]    Culture   Final    NO GROWTH < 24 HOURS Performed at [facility] [address], [place], [place] [address]    Report Status PENDING  Incomplete  Resp panel by RT-PCR (RSV, Flu A&B, Covid) Anterior Nasal Swab     Status: None   Collection Time: 10/26/2022  6:55 AM   Specimen: Anterior Nasal Swab  Result Value Ref Range Status   SARS Coronavirus 2 by RT PCR NEGATIVE NEGATIVE Final    Comment: (NOTE) SARS-CoV-2 target nucleic acids are NOT DETECTED.  The SARS-CoV-2 RNA is generally detectable in upper respiratory specimens during the acute phase of infection. The lowest concentration of SARS-CoV-2 viral copies this assay can detect is 138 copies/mL. A negative result does not preclude SARS-Cov-2 infection and should not be used as the sole basis for treatment or other patient management decisions. A negative result may occur with  improper specimen collection/handling, submission of specimen other than nasopharyngeal swab, presence of viral mutation(s) within the areas targeted by this assay, and inadequate number of viral copies(<138 copies/mL). A negative result must be combined with clinical observations, patient history, and epidemiological information. The expected result is Negative.  Fact Sheet for Patients:  EntrepreneurPulse.com.au  Fact Sheet for Healthcare Providers:  IncredibleEmployment.be  This test is no t yet approved or cleared by the [place] FDA and  has been authorized for detection and/or diagnosis of SARS-CoV-2 by FDA under an Emergency Use Authorization (EUA). This EUA will remain  in effect (meaning this test can be used) for the duration of the COVID-19 declaration under Section 564(b)(1) of the Act, 21 U.S.C.section 360bbb-3(b)(1), unless the  authorization is terminated  or revoked sooner.       Influenza A by PCR NEGATIVE NEGATIVE Final   Influenza B by PCR NEGATIVE NEGATIVE Final    Comment: (NOTE) The Xpert Xpress SARS-CoV-2/FLU/RSV plus assay is intended as an aid in the diagnosis of influenza from Nasopharyngeal swab specimens and should not be used as a sole basis for treatment. Nasal washings and aspirates are unacceptable for Xpert Xpress SARS-CoV-2/FLU/RSV testing.  Fact Sheet for Patients: EntrepreneurPulse.com.au  Fact Sheet for Healthcare Providers: IncredibleEmployment.be  This test is not yet approved or cleared by the [place] FDA and has been authorized for detection and/or diagnosis of SARS-CoV-2 by FDA under an Emergency Use Authorization (EUA). This EUA will remain in effect (meaning this test can be used) for the duration of the COVID-19 declaration under Section 564(b)(1) of the Act, 21 U.S.C. section 360bbb-3(b)(1), unless the authorization is terminated or revoked.     Resp Syncytial Virus by PCR NEGATIVE NEGATIVE Final    Comment: (NOTE) Fact Sheet for Patients: EntrepreneurPulse.com.au  Fact Sheet for Healthcare Providers: IncredibleEmployment.be  This test is not yet approved or cleared by the [place] FDA and has been authorized for detection and/or diagnosis of SARS-CoV-2 by FDA under an Emergency Use Authorization (EUA). This EUA will remain in effect (meaning this test can be used) for the duration of the COVID-19 declaration under Section 564(b)(1) of the Act, 21 U.S.C. section 360bbb-3(b)(1), unless the authorization is terminated or revoked.  Performed at [facility], [place] [address], [place], [place] [address]   MRSA Next Gen by PCR, Nasal     Status: None  Collection Time: 10/30/2022 10:03 AM   Specimen: Nasal Mucosa; Nasal Swab  Result Value Ref Range Status   MRSA by PCR  Next Gen NOT DETECTED NOT DETECTED Final    Comment: (NOTE) The GeneXpert MRSA Assay (FDA approved for NASAL specimens only), is one component of a comprehensive MRSA colonization surveillance program. It is not intended to diagnose MRSA infection nor to guide or monitor treatment for MRSA infections. Test performance is not FDA approved in patients less than 16 years old. Performed at [facility], [place] [address], [place], [place] [address]   Blood Culture (routine x 2)     Status: None (Preliminary result)   Collection Time: 11/02/2022  4:46 PM   Specimen: BLOOD RIGHT ARM  Result Value Ref Range Status   Specimen Description   Final    BLOOD RIGHT ARM Performed at [facility], [place] [address], [place], [place] [address]    Special Requests   Final    BOTTLES DRAWN AEROBIC AND ANAEROBIC Blood Culture adequate volume Performed at [facility] [address], [place], [place] [address]    Culture   Final    NO GROWTH < 24 HOURS Performed at [facility] [address], [place],  [address]    Report Status PENDING  Incomplete    Time spent: 15 minutes  Signed: Nita Sells, MD 10-26-22

## 2022-11-08 NOTE — IPAL (Signed)
  Interdisciplinary Goals of Care Family Meeting   Date carried out: 10/21/22  Location of the meeting: Bedside  Member's involved: Physician and Family Member or next of kin  Durable Power of Attorney or acting medical decision maker: yes    Discussion: We discussed goals of care for [employer] .  Family has had a discussion among some cells-they are over 8 people at the bedside including the HCPOA They would not want her to suffer They do agree with comfort measures after this is described  Code status:   Code Status: DNR   Disposition: In-patient comfort care  Time spent for the meeting: [place], MD  10/21/22, 9:08 AM

## 2022-11-08 NOTE — Progress Notes (Addendum)
       Overnight   NAME: Jacqueline Dodson MRN: [id number] DOB : 1927-06-02    Date of Service   10-30-22   HPI/Events of Note   89 year old female with medical history of gait abnormality, cerebrovascular disease, primary cerebellar degeneration, restless leg syndrome, anxiety, osteoarthritis, stage IV CKD, cystocele, dermatophytosis, dizziness, duodenal PUD, edema, hypertension, GERD, hemorrhoids, left bundle branch block, lumbago, macular degeneration, myalgia and myositis, orthostatic hypotension, history of falls, polyneuropathy, postmenopausal atrophic vaginitis, constipation.  Notified by RN increased work of breathing, respiratory rate, decreased oxygen saturation. Patient is listed as a DNR, family was notified and is at bedside currently to verify intentions of care  . Discussion with family has yielded no increase in measures.  ABG obtained, chest x-ray obtained. Adjustments to BiPAP by RT. Attending informed.  Discussion with family at bedside, family has decided to make patient comfort measures only (CMO).  Patient will remain on BiPAP morphine will be added for work of breathing/air hunger.     Interventions/ Plan   Maintain BiPAP Morphine PRN CMO (comfort measures only)      Lab Values and Imaging       resulted during discussion with family:   Latest Reference Range & Units 10-30-2022 01:06  Delivery systems  BILEVEL POSITIVE AIRWAY PRESSURE  FIO2 % 100.00  pH, Arterial 7.35 - 7.45  7.15 (LL)  pCO2 arterial 32 - 48 mmHg 29 (L)  pO2, Arterial 83 - 108 mmHg 57 (L)  Acid-base deficit 0.0 - 2.0 mmol/L 17.4 (H)  Bicarbonate 20.0 - 28.0 mmol/L 10.1 (L)  O2 Saturation % 85.2  Patient temperature  37.0  (LL): Data is critically low (L): Data is abnormally low (H): Data is abnormally high ------------------------------------------------------------------------------- EXAM: PORTABLE CHEST 1 VIEW   COMPARISON:  11/04/2022.   FINDINGS: The heart size and mediastinal  contours are stable. There is atherosclerotic calcification of the aorta. Increasing perihilar airspace opacities are noted in the lungs bilaterally with consolidation in the right upper lobe. There are small bilateral pleural effusions. No pneumothorax. No acute osseous abnormality.   IMPRESSION: 1. Increasing perihilar airspace opacities bilaterally with new consolidation in the right upper lobe. 2. Small bilateral pleural effusions.     Electronically Signed   By: Brett Fairy M.D.   On: 10-30-2022 01:23   ================================================     Gershon Cull BSN MSNA MSN ACNPC-AG Acute Care Nurse Practitioner [place]

## 2022-11-08 NOTE — Progress Notes (Signed)
PROGRESS NOTE   Jacqueline Dodson  PJA:[id number] DOB: Jan 01, 1927 DOA: 10/28/2022 PCP: Mast, Man X, NP  Brief Narrative:  89 year old white female known prior CVA cerebellar degeneration CKD 4 PUD edema LBB lumbago and multiple other issues presented with worsening dyspnea Had been seen at friend's home where she stays and was found to have sats in the 88 percentile on 10/08/2022 Doxycycline was prescribed by practitioner She significantly worsened on 1/2 sats were mid 80s and she was placed on nonrebreather She was oriented-sats were 92% on 15 L after initiation soft blood pressures received 1 L crystalloid and then 30 cc/KG Started on Rocephin and azithromycin BiPAP was trialed Creatinine was 4 up from 1.6  DNR was discussed and patient was transitioned to comfort measures after long discussion  Hospital-Problem based course  Severe sepsis on admission with acute respiratory failure secondary to bilateral bronchopneumonia likely secondary to aspiration - All antibiotics stopped now comfort measures - We have started initiated the comfort measure order set-we will continue morphine but started drip and give pushes as needed - She can transfer to [facility] floor - I do not think she will survive overnight-if she does then we will have to talk about residential placement if she surpasses 48 hours in hospital  Hypotension secondary to severe sepsis on admission - Stop fluids stop either aggressive measures  AKI superimposed on CKD 4 - Stop fluids  Pressure injury prior to arrival No further workup  DNR confirmed Family Communication: All family at bedside Disposition:  Status is: Inpatient Remains inpatient appropriate because:   End-of-life care    Subjective: Arousable but on BiPAP and difficult to get orientation  Objective: Vitals:   2022/11/04 0426 11/04/2022 0550 November 04, 2022 0800 11-04-22 0815  BP:      Pulse: 98 87 82 84  Resp: (!) 36 (!) 38 (!) 38 (!) 39  Temp:      TempSrc:       SpO2: (!) 88% (!) 83% (!) 84% (!) 83%  Weight:      Height:        Intake/Output Summary (Last 24 hours) at Nov 04, 2022 0910 Last data filed at 2022/11/04 0400 Gross per 24 hour  Intake 2079.33 ml  Output --  Net 2079.33 ml   Filed Weights   10/31/2022 1630  Weight: 66.9 kg    Examination:  Increased work of breathing on BiPAP S1-S2 no murmur Abdomen soft Extremities warm  Data Reviewed: personally reviewed   CBC    Component Value Date/Time   WBC 16.1 (H) 10/15/2022 0528   RBC 3.50 (L) 10/14/2022 0528   HGB 10.3 (L) 10/25/2022 0528   HCT 30.6 (L) 10/30/2022 0528   PLT 324 11/06/2022 0528   MCV 87.4 10/18/2022 0528   MCV 87.1 08/28/2016 1558   MCH 29.4 11/03/2022 0528   MCHC 33.7 10/14/2022 0528   RDW 13.1 10/28/2022 0528   LYMPHSABS 1.6 11/02/2022 0528   MONOABS 1.2 (H) 11/04/2022 0528   EOSABS 0.1 11/06/2022 0528   BASOSABS 0.0 10/31/2022 0528      Latest Ref Rng & Units 10/14/2022    5:28 AM 08/24/2019    2:25 PM 08/18/2019    7:05 AM  CMP  Glucose 70 - 99 mg/dL 137  106  106   BUN 8 - 23 mg/dL 80  30  36   Creatinine 0.44 - 1.00 mg/dL 4.03  1.69  1.64   Sodium 135 - 145 mmol/L 131  138  143   Potassium 3.5 -  5.1 mmol/L 4.1  3.9  3.9   Chloride 98 - 111 mmol/L 106  109  107   CO2 22 - 32 mmol/L '13  21  25   '$ Calcium 8.9 - 10.3 mg/dL 8.1  9.2  9.1   Total Protein 6.5 - 8.1 g/dL 6.8   6.5   Total Bilirubin 0.3 - 1.2 mg/dL 0.6   0.4   Alkaline Phos 38 - 126 U/L 72     AST 15 - 41 U/L 62   16   ALT 0 - 44 U/L 25   12      Radiology Studies: DG CHEST PORT 1 VIEW  Result Date: 10/17/22 CLINICAL DATA:  Increasing shortness of breath. EXAM: PORTABLE CHEST 1 VIEW COMPARISON:  10/11/2022. FINDINGS: The heart size and mediastinal contours are stable. There is atherosclerotic calcification of the aorta. Increasing perihilar airspace opacities are noted in the lungs bilaterally with consolidation in the right upper lobe. There are small bilateral pleural effusions.  No pneumothorax. No acute osseous abnormality. IMPRESSION: 1. Increasing perihilar airspace opacities bilaterally with new consolidation in the right upper lobe. 2. Small bilateral pleural effusions. Electronically Signed   By: Brett Fairy M.D.   On: October 17, 2022 01:23   DG Chest Port 1 View  Result Date: 10/24/2022 CLINICAL DATA:  89 year old female with possible sepsis. EXAM: PORTABLE CHEST 1 VIEW COMPARISON:  Portable chest 08/24/2019 and earlier. FINDINGS: Portable AP semi upright view at at 0530 hours. Mildly rotated to the left. Chronic pulmonary hyperinflation suspected. Superimposed veiling opacity at both lung bases, similar to 2020, but confluent increased bilateral perihilar opacity since that time. Similar background increased pulmonary interstitial markings diffusely. No pneumothorax. No air bronchograms. Mediastinal contours appear stable. Calcified aortic atherosclerosis. Visualized tracheal air column is within normal limits. No acute osseous abnormality identified. IMPRESSION: Suspected chronic pulmonary hyperinflation with evidence of small bilateral pleural effusions (may be chronic, similar in 2020) and Acute nonspecific bilateral perihilar opacity. Differential considerations include acute pulmonary edema, bilateral bronchopneumonia, aspiration, and less likely lung mass given symmetry. Electronically Signed   By: Genevie Ann M.D.   On: 11/03/2022 05:44     Scheduled Meds:  Chlorhexidine Gluconate Cloth  6 each Topical Daily   cholecalciferol  1,000 Units Oral Daily   gabapentin  300 mg Oral BID   NIFEdipine  60 mg Oral Daily   mouth rinse  15 mL Mouth Rinse 4 times per day   pantoprazole  40 mg Oral QODAY   Continuous Infusions:  sodium chloride 10 mL/hr at 2022/10/17 0400   sodium chloride     morphine       LOS: 1 day   Time spent: Accident, MD [facility] To contact the attending provider between 7A-7P or the covering provider during after hours  7P-7A, please log into the web site [email or web address] and access using universal [place] password for that web site. If you do not have the password, please call the hospital operator.  2022-10-17, 9:10 AM

## 2022-11-08 DEATH — deceased

## 2022-11-29 ENCOUNTER — Encounter: Payer: Medicare Other | Admitting: Nurse Practitioner

## 2023-02-04 ENCOUNTER — Encounter (INDEPENDENT_AMBULATORY_CARE_PROVIDER_SITE_OTHER): Payer: Medicare Other | Admitting: Ophthalmology

## 2023-02-05 ENCOUNTER — Encounter (INDEPENDENT_AMBULATORY_CARE_PROVIDER_SITE_OTHER): Payer: Medicare Other | Admitting: Ophthalmology
